# Patient Record
Sex: Female | Born: 1995 | Race: Asian | Hispanic: No | Smoking: Never smoker
Health system: Southern US, Community
[De-identification: ages and names within clinical notes are randomized; demographics above are authoritative.]

## PROBLEM LIST (undated history)

## (undated) ENCOUNTER — Inpatient Hospital Stay (HOSPITAL_COMMUNITY): Payer: Self-pay

## (undated) DIAGNOSIS — D649 Anemia, unspecified: Secondary | ICD-10-CM

## (undated) DIAGNOSIS — Z8619 Personal history of other infectious and parasitic diseases: Secondary | ICD-10-CM

## (undated) DIAGNOSIS — O2303 Infections of kidney in pregnancy, third trimester: Secondary | ICD-10-CM

## (undated) HISTORY — PX: WISDOM TOOTH EXTRACTION: SHX21

## (undated) HISTORY — DX: Personal history of other infectious and parasitic diseases: Z86.19

---
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
## 2014-07-18 LAB — OB RESULTS CONSOLE ANTIBODY SCREEN: ANTIBODY SCREEN: NEGATIVE

## 2014-07-18 LAB — OB RESULTS CONSOLE ABO/RH: RH Type: POSITIVE

## 2014-07-18 LAB — OB RESULTS CONSOLE HIV ANTIBODY (ROUTINE TESTING): HIV: NONREACTIVE

## 2014-07-18 LAB — OB RESULTS CONSOLE RPR: RPR: NONREACTIVE

## 2014-07-18 LAB — OB RESULTS CONSOLE GC/CHLAMYDIA
CHLAMYDIA, DNA PROBE: NEGATIVE
GC PROBE AMP, GENITAL: NEGATIVE

## 2014-07-18 LAB — OB RESULTS CONSOLE RUBELLA ANTIBODY, IGM: Rubella: IMMUNE

## 2014-07-18 LAB — OB RESULTS CONSOLE HEPATITIS B SURFACE ANTIGEN: HEP B S AG: NEGATIVE

## 2014-12-28 LAB — OB RESULTS CONSOLE GBS: GBS: NEGATIVE

## 2014-12-31 ENCOUNTER — Inpatient Hospital Stay (HOSPITAL_COMMUNITY)
Admission: AD | Admit: 2014-12-31 | Discharge: 2015-01-01 | Disposition: A | Discharge status: Home / Self Care | Payer: PRIVATE HEALTH INSURANCE | Source: Ambulatory Visit | Attending: Obstetrics and Gynecology | Admitting: Obstetrics and Gynecology

## 2014-12-31 ENCOUNTER — Encounter (HOSPITAL_COMMUNITY): Payer: Self-pay | Admitting: *Deleted

## 2014-12-31 DIAGNOSIS — Z3A37 37 weeks gestation of pregnancy: Secondary | ICD-10-CM | POA: Insufficient documentation

## 2014-12-31 DIAGNOSIS — O99613 Diseases of the digestive system complicating pregnancy, third trimester: Secondary | ICD-10-CM | POA: Insufficient documentation

## 2014-12-31 DIAGNOSIS — A084 Viral intestinal infection, unspecified: Secondary | ICD-10-CM | POA: Insufficient documentation

## 2014-12-31 DIAGNOSIS — O212 Late vomiting of pregnancy: Secondary | ICD-10-CM | POA: Diagnosis present

## 2014-12-31 LAB — URINE MICROSCOPIC-ADD ON: RBC / HPF: NONE SEEN RBC/hpf (ref 0–5)

## 2014-12-31 LAB — URINALYSIS, ROUTINE W REFLEX MICROSCOPIC
Bilirubin Urine: NEGATIVE
GLUCOSE, UA: NEGATIVE mg/dL
HGB URINE DIPSTICK: NEGATIVE
KETONES UR: NEGATIVE mg/dL
Nitrite: NEGATIVE
PROTEIN: NEGATIVE mg/dL
Specific Gravity, Urine: 1.015 (ref 1.005–1.030)
pH: 7 (ref 5.0–8.0)

## 2014-12-31 NOTE — MAU Provider Note (Signed)
  History     CSN: [id number]  Arrival date and time: 12/31/14 2308   First Provider Initiated Contact with Patient 12/31/14 2354      No chief complaint on file.  HPI Comments: Courtney Colon is a 19 y.o. G1P0 at 5862w5d who presents today with vomiting. She states that for about 2-3 days she has felt nauseous, and today she vomited once. She denies any abdominal pain, contractions or vaginal bleeding. She states that the fetus has been moving normally.   Emesis  This is a new problem. The current episode started today. The problem occurs less than 2 times per day. The problem has been resolved. The emesis has an appearance of stomach contents. There has been no fever. Pertinent negatives include no abdominal pain, chills, diarrhea or fever. Risk factors: No sick contacts, no suspect food intake.  She has tried nothing for the symptoms.     Past Medical History  Diagnosis Date  . Medical history non-contributory     Past Surgical History  Procedure Laterality Date  . Wisdom tooth extraction    . Mouth surgery  2011    No family history on file.  Social History  Substance Use Topics  . Smoking status: None  . Smokeless tobacco: None  . Alcohol Use: None    Allergies: No Known Allergies  No prescriptions prior to admission    Review of Systems  Constitutional: Negative for fever and chills.  Gastrointestinal: Positive for nausea and vomiting. Negative for abdominal pain, diarrhea and constipation.  Genitourinary: Negative for dysuria, urgency and frequency.   Physical Exam   Blood pressure 129/84, pulse 120, temperature 98.7 F (37.1 C), temperature source Oral, resp. rate 20, height 4\' 10"  (1.473 m), weight 62.313 kg (137 lb 6 oz).  Physical Exam  Nursing note and vitals reviewed. Constitutional: She is oriented to person, place, and time. She appears well-developed and well-nourished. No distress.  HENT:  Head: Normocephalic.  Cardiovascular: Normal rate.    Respiratory: Effort normal.  GI: Soft. There is no tenderness. There is no rebound.  Neurological: She is alert and oriented to person, place, and time.  Skin: Skin is warm and dry.  Psychiatric: She has a normal mood and affect.   FHT: 130, moderate with 15x15 accels, no decels Toco: no UCs Cervical examL 1/70/-3  MAU Course  Procedures  MDM 0113: D/W Dr. Tenny Crawoss. [facility] for [employer] [facility].   Assessment and Plan   1. Viral gastroenteritis    DC home Comfort measures reviewed  3rd Trimester precautions  labor precautions  Fetal kick counts RX: zofran #20 0RF  Return to MAU as needed FU with OB as planned  Follow-up Information    Follow up with Almon HerculesOSS,KENDRA H., MD.   Specialty:  Obstetrics and Gynecology   Why:  As scheduled   Contact information:   [address] [place] [id number] [phone]         Tawnya CrookHogan, Heather Donovan 12/31/2014, 11:56 PM

## 2014-12-31 NOTE — MAU Note (Signed)
PT SAYS SHE  HAD NAUSEA LAST WEEK  BUT  TODAY   STARTED  VOMITING.   NO UC.  VE IN OFFICE  - 1  CM .    DENIES HSV AND MRSA. GBS- UNSURE.

## 2015-01-01 DIAGNOSIS — A084 Viral intestinal infection, unspecified: Secondary | ICD-10-CM

## 2015-01-01 DIAGNOSIS — O99613 Diseases of the digestive system complicating pregnancy, third trimester: Secondary | ICD-10-CM | POA: Diagnosis not present

## 2015-01-01 MED ORDER — ONDANSETRON 8 MG PO TBDP: 8.0000 mg | ORAL_TABLET | Freq: Three times a day (TID) | ORAL | Status: DC | PRN

## 2015-01-01 MED ORDER — ACETAMINOPHEN 500 MG PO TABS
1000.0000 mg | ORAL_TABLET | Freq: Once | ORAL | Status: AC
  Administered 2015-01-01: 1000 mg via ORAL
  Filled 2015-01-01: qty 2

## 2015-01-01 MED ORDER — ONDANSETRON 8 MG PO TBDP
8.0000 mg | ORAL_TABLET | Freq: Once | ORAL | Status: AC
  Administered 2015-01-01: 8 mg via ORAL
  Filled 2015-01-01: qty 1

## 2015-01-01 NOTE — Discharge Instructions (Signed)

## 2015-01-02 ENCOUNTER — Inpatient Hospital Stay (HOSPITAL_COMMUNITY)
Admission: AD | Admit: 2015-01-02 | Discharge: 2015-01-02 | Disposition: A | Discharge status: Home / Self Care | Payer: PRIVATE HEALTH INSURANCE | Source: Ambulatory Visit | Attending: Obstetrics | Admitting: Obstetrics

## 2015-01-02 ENCOUNTER — Encounter (HOSPITAL_COMMUNITY): Payer: Self-pay

## 2015-01-02 DIAGNOSIS — Z3493 Encounter for supervision of normal pregnancy, unspecified, third trimester: Secondary | ICD-10-CM | POA: Insufficient documentation

## 2015-01-02 NOTE — Discharge Instructions (Signed)
Third Trimester of Pregnancy °The third trimester is from week 29 through week 42, months 7 through 9. The third trimester is a time when the fetus is growing rapidly. At the end of the ninth month, the fetus is about 20 inches in length and weighs 6-10 pounds.  °BODY CHANGES °Your body goes through many changes during pregnancy. The changes vary from woman to woman.  °· Your weight will continue to increase. You can expect to gain 25-35 pounds (11-16 kg) by the end of the pregnancy. °· You may begin to get stretch marks on your hips, abdomen, and breasts. °· You may urinate more often because the fetus is moving lower into your pelvis and pressing on your bladder. °· You may develop or continue to have heartburn as a result of your pregnancy. °· You may develop constipation because certain hormones are causing the muscles that push waste through your intestines to slow down. °· You may develop hemorrhoids or swollen, bulging veins (varicose veins). °· You may have pelvic pain because of the weight gain and pregnancy hormones relaxing your joints between the bones in your pelvis. Backaches may result from overexertion of the muscles supporting your posture. °· You may have changes in your hair. These can include thickening of your hair, rapid growth, and changes in texture. Some women also have hair loss during or after pregnancy, or hair that feels dry or thin. Your hair will most likely return to normal after your baby is born. °· Your breasts will continue to grow and be tender. A yellow discharge may leak from your breasts called colostrum. °· Your belly button may stick out. °· You may feel short of breath because of your expanding uterus. °· You may notice the fetus "dropping," or moving lower in your abdomen. °· You may have a bloody mucus discharge. This usually occurs a few days to a week before labor begins. °· Your cervix becomes thin and soft (effaced) near your due date. °WHAT TO EXPECT AT YOUR PRENATAL  EXAMS  °You will have prenatal exams every 2 weeks until week 36. Then, you will have weekly prenatal exams. During a routine prenatal visit: °· You will be weighed to make sure you and the fetus are growing normally. °· Your blood pressure is taken. °· Your abdomen will be measured to track your baby's growth. °· The fetal heartbeat will be listened to. °· Any test results from the previous visit will be discussed. °· You may have a cervical check near your due date to see if you have effaced. °At around 36 weeks, your caregiver will check your cervix. At the same time, your caregiver will also perform a test on the secretions of the vaginal tissue. This test is to determine if a type of bacteria, Group B streptococcus, is present. Your caregiver will explain this further. °Your caregiver may ask you: °· What your birth plan is. °· How you are feeling. °· If you are feeling the baby move. °· If you have had any abnormal symptoms, such as leaking fluid, bleeding, severe headaches, or abdominal cramping. °· If you are using any tobacco products, including cigarettes, chewing tobacco, and electronic cigarettes. °· If you have any questions. °Other tests or screenings that may be performed during your third trimester include: °· Blood tests that check for low iron levels (anemia). °· Fetal testing to check the health, activity level, and growth of the fetus. Testing is done if you have certain medical conditions or if   there are problems during the pregnancy. °· HIV (human immunodeficiency virus) testing. If you are at high risk, you may be screened for HIV during your third trimester of pregnancy. °FALSE LABOR °You may feel small, irregular contractions that eventually go away. These are called Braxton Hicks contractions, or false labor. Contractions may last for hours, days, or even weeks before true labor sets in. If contractions come at regular intervals, intensify, or become painful, it is best to be seen by your  caregiver.  °SIGNS OF LABOR  °· Menstrual-like cramps. °· Contractions that are 5 minutes apart or less. °· Contractions that start on the top of the uterus and spread down to the lower abdomen and back. °· A sense of increased pelvic pressure or back pain. °· A watery or bloody mucus discharge that comes from the vagina. °If you have any of these signs before the 37th week of pregnancy, call your caregiver right away. You need to go to the hospital to get checked immediately. °HOME CARE INSTRUCTIONS  °· Avoid all smoking, herbs, alcohol, and unprescribed drugs. These chemicals affect the formation and growth of the baby. °· Do not use any tobacco products, including cigarettes, chewing tobacco, and electronic cigarettes. If you need help quitting, ask your health care provider. You may receive counseling support and other resources to help you quit. °· Follow your caregiver's instructions regarding medicine use. There are medicines that are either safe or unsafe to take during pregnancy. °· Exercise only as directed by your caregiver. Experiencing uterine cramps is a good sign to stop exercising. °· Continue to eat regular, healthy meals. °· Wear a good support bra for breast tenderness. °· Do not use hot tubs, steam rooms, or saunas. °· Wear your seat belt at all times when driving. °· Avoid raw meat, uncooked cheese, cat litter boxes, and soil used by cats. These carry germs that can cause birth defects in the baby. °· Take your prenatal vitamins. °· Take 1500-2000 mg of calcium daily starting at the 20th week of pregnancy until you deliver your baby. °· Try taking a stool softener (if your caregiver approves) if you develop constipation. Eat more high-fiber foods, such as fresh vegetables or fruit and whole grains. Drink plenty of fluids to keep your urine clear or pale yellow. °· Take warm sitz baths to soothe any pain or discomfort caused by hemorrhoids. Use hemorrhoid cream if your caregiver approves. °· If  you develop varicose veins, wear support hose. Elevate your feet for 15 minutes, 3-4 times a day. Limit salt in your diet. °· Avoid heavy lifting, wear low heal shoes, and practice good posture. °· Rest a lot with your legs elevated if you have leg cramps or low back pain. °· Visit your dentist if you have not gone during your pregnancy. Use a soft toothbrush to brush your teeth and be gentle when you floss. °· A sexual relationship may be continued unless your caregiver directs you otherwise. °· Do not travel far distances unless it is absolutely necessary and only with the approval of your caregiver. °· Take prenatal classes to understand, practice, and ask questions about the labor and delivery. °· Make a trial run to the hospital. °· Pack your hospital bag. °· Prepare the baby's nursery. °· Continue to go to all your prenatal visits as directed by your caregiver. °SEEK MEDICAL CARE IF: °· You are unsure if you are in labor or if your water has broken. °· You have dizziness. °· You have   mild pelvic cramps, pelvic pressure, or nagging pain in your abdominal area. °· You have persistent nausea, vomiting, or diarrhea. °· You have a bad smelling vaginal discharge. °· You have pain with urination. °SEEK IMMEDIATE MEDICAL CARE IF:  °· You have a fever. °· You are leaking fluid from your vagina. °· You have spotting or bleeding from your vagina. °· You have severe abdominal cramping or pain. °· You have rapid weight loss or gain. °· You have shortness of breath with chest pain. °· You notice sudden or extreme swelling of your face, hands, ankles, feet, or legs. °· You have not felt your baby move in over an hour. °· You have severe headaches that do not go away with medicine. °· You have vision changes. °  °This information is not intended to replace advice given to you by your health care provider. Make sure you discuss any questions you have with your health care provider. °  °Document Released: 01/06/2001 Document  Revised: 02/02/2014 Document Reviewed: 03/15/2012 °Elsevier Interactive Patient Education ©2016 [employer] °Fetal Movement Counts °Patient Name: __________________________________________________ Patient Due Date: ____________________ °Performing a fetal movement count is highly recommended in high-risk pregnancies, but it is good for every pregnant woman to do. Your health care provider may ask you to start counting fetal movements at 28 weeks of the pregnancy. Fetal movements often increase: °· After eating a full meal. °· After physical activity. °· After eating or drinking something sweet or cold. °· At rest. °Pay attention to when you feel the baby is most active. This will help you notice a pattern of your baby's sleep and wake cycles and what factors contribute to an increase in fetal movement. It is important to perform a fetal movement count at the same time each day when your baby is normally most active.  °HOW TO COUNT FETAL MOVEMENTS °· Find a quiet and comfortable area to sit or lie down on your left side. Lying on your left side provides the best blood and oxygen circulation to your baby. °· Write down the day and time on a sheet of paper or in a journal. °· Start counting kicks, flutters, swishes, rolls, or jabs in a 2-hour period. You should feel at least 10 movements within 2 hours. °· If you do not feel 10 movements in 2 hours, wait 2-3 hours and count again. Look for a change in the pattern or not enough counts in 2 hours. °SEEK MEDICAL CARE IF: °· You feel less than 10 counts in 2 hours, tried twice. °· There is no movement in over an hour. °· The pattern is changing or taking longer each day to reach 10 counts in 2 hours. °· You feel the baby is not moving as he or she usually does. °Date: ____________ Movements: ____________ Start time: ____________ Finish time: ____________  °Date: ____________ Movements: ____________ Start time: ____________ Finish time: ____________ °Date: ____________  Movements: ____________ Start time: ____________ Finish time: ____________ °Date: ____________ Movements: ____________ Start time: ____________ Finish time: ____________ °Date: ____________ Movements: ____________ Start time: ____________ Finish time: ____________ °Date: ____________ Movements: ____________ Start time: ____________ Finish time: ____________ °Date: ____________ Movements: ____________ Start time: ____________ Finish time: ____________ °Date: ____________ Movements: ____________ Start time: ____________ Finish time: ____________  °Date: ____________ Movements: ____________ Start time: ____________ Finish time: ____________ °Date: ____________ Movements: ____________ Start time: ____________ Finish time: ____________ °Date: ____________ Movements: ____________ Start time: ____________ Finish time: ____________ °Date: ____________ Movements: ____________ Start time: ____________ Finish time: ____________ °Date:   ____________ Movements: ____________ Start time: ____________ Finish time: ____________ °Date: ____________ Movements: ____________ Start time: ____________ Finish time: ____________ °Date: ____________ Movements: ____________ Start time: ____________ Finish time: ____________  °Date: ____________ Movements: ____________ Start time: ____________ Finish time: ____________ °Date: ____________ Movements: ____________ Start time: ____________ Finish time: ____________ °Date: ____________ Movements: ____________ Start time: ____________ Finish time: ____________ °Date: ____________ Movements: ____________ Start time: ____________ Finish time: ____________ °Date: ____________ Movements: ____________ Start time: ____________ Finish time: ____________ °Date: ____________ Movements: ____________ Start time: ____________ Finish time: ____________ °Date: ____________ Movements: ____________ Start time: ____________ Finish time: ____________  °Date: ____________ Movements: ____________ Start time:  ____________ Finish time: ____________ °Date: ____________ Movements: ____________ Start time: ____________ Finish time: ____________ °Date: ____________ Movements: ____________ Start time: ____________ Finish time: ____________ °Date: ____________ Movements: ____________ Start time: ____________ Finish time: ____________ °Date: ____________ Movements: ____________ Start time: ____________ Finish time: ____________ °Date: ____________ Movements: ____________ Start time: ____________ Finish time: ____________ °Date: ____________ Movements: ____________ Start time: ____________ Finish time: ____________  °Date: ____________ Movements: ____________ Start time: ____________ Finish time: ____________ °Date: ____________ Movements: ____________ Start time: ____________ Finish time: ____________ °Date: ____________ Movements: ____________ Start time: ____________ Finish time: ____________ °Date: ____________ Movements: ____________ Start time: ____________ Finish time: ____________ °Date: ____________ Movements: ____________ Start time: ____________ Finish time: ____________ °Date: ____________ Movements: ____________ Start time: ____________ Finish time: ____________ °Date: ____________ Movements: ____________ Start time: ____________ Finish time: ____________  °Date: ____________ Movements: ____________ Start time: ____________ Finish time: ____________ °Date: ____________ Movements: ____________ Start time: ____________ Finish time: ____________ °Date: ____________ Movements: ____________ Start time: ____________ Finish time: ____________ °Date: ____________ Movements: ____________ Start time: ____________ Finish time: ____________ °Date: ____________ Movements: ____________ Start time: ____________ Finish time: ____________ °Date: ____________ Movements: ____________ Start time: ____________ Finish time: ____________ °Date: ____________ Movements: ____________ Start time: ____________ Finish time: ____________  °Date:  ____________ Movements: ____________ Start time: ____________ Finish time: ____________ °Date: ____________ Movements: ____________ Start time: ____________ Finish time: ____________ °Date: ____________ Movements: ____________ Start time: ____________ Finish time: ____________ °Date: ____________ Movements: ____________ Start time: ____________ Finish time: ____________ °Date: ____________ Movements: ____________ Start time: ____________ Finish time: ____________ °Date: ____________ Movements: ____________ Start time: ____________ Finish time: ____________ °Date: ____________ Movements: ____________ Start time: ____________ Finish time: ____________  °Date: ____________ Movements: ____________ Start time: ____________ Finish time: ____________ °Date: ____________ Movements: ____________ Start time: ____________ Finish time: ____________ °Date: ____________ Movements: ____________ Start time: ____________ Finish time: ____________ °Date: ____________ Movements: ____________ Start time: ____________ Finish time: ____________ °Date: ____________ Movements: ____________ Start time: ____________ Finish time: ____________ °Date: ____________ Movements: ____________ Start time: ____________ Finish time: ____________ °  °This information is not intended to replace advice given to you by your health care provider. Make sure you discuss any questions you have with your health care provider. °  °Document Released: 02/11/2006 Document Revised: 02/02/2014 Document Reviewed: 11/09/2011 °[employer]vier Interactive Patient Education ©2016 [employer] °Braxton Hicks Contractions °Contractions of the uterus can occur throughout pregnancy. Contractions are not always a sign that you are in labor.  °WHAT ARE BRAXTON HICKS CONTRACTIONS?  °Contractions that occur before labor are called Braxton Hicks contractions, or false labor. Toward the end of pregnancy (32-34 weeks), these contractions can develop more often and may become more  forceful. This is not true labor because these contractions do not result in opening (dilatation) and thinning of the cervix. They are sometimes difficult to tell apart from true labor because these contractions can be forceful and people have different pain tolerances. You should   not feel embarrassed if you go to the hospital with false labor. Sometimes, the only way to tell if you are in true labor is for your health care provider to look for changes in the cervix. °If there are no prenatal problems or other health problems associated with the pregnancy, it is completely safe to be sent home with false labor and await the onset of true labor. °HOW CAN YOU TELL THE DIFFERENCE BETWEEN TRUE AND FALSE LABOR? °False Labor °· The contractions of false labor are usually shorter and not as hard as those of true labor.   °· The contractions are usually irregular.   °· The contractions are often felt in the front of the lower abdomen and in the groin.   °· The contractions may go away when you walk around or change positions while lying down.   °· The contractions get weaker and are shorter lasting as time goes on.   °· The contractions do not usually become progressively stronger, regular, and closer together as with true labor.   °True Labor °· Contractions in true labor last 30-70 seconds, become very regular, usually become more intense, and increase in frequency.   °· The contractions do not go away with walking.   °· The discomfort is usually felt in the top of the uterus and spreads to the lower abdomen and low back.   °· True labor can be determined by your health care provider with an exam. This will show that the cervix is dilating and getting thinner.   °WHAT TO REMEMBER °· Keep up with your usual exercises and follow other instructions given by your health care provider.   °· Take medicines as directed by your health care provider.   °· Keep your regular prenatal appointments.   °· Eat and drink lightly if you  think you are going into labor.   °· If Braxton Hicks contractions are making you uncomfortable:   °· Change your position from lying down or resting to walking, or from walking to resting.   °· Sit and rest in a tub of warm water.   °· Drink 2-3 glasses of water. Dehydration may cause these contractions.   °· Do slow and deep breathing several times an hour.   °WHEN SHOULD I SEEK IMMEDIATE MEDICAL CARE? °Seek immediate medical care if: °· Your contractions become stronger, more regular, and closer together.   °· You have fluid leaking or gushing from your vagina.   °· You have a fever.   °· You pass blood-tinged mucus.   °· You have vaginal bleeding.   °· You have continuous abdominal pain.   °· You have low back pain that you never had before.   °· You feel your baby's head pushing down and causing pelvic pressure.   °· Your baby is not moving as much as it used to.   °  °This information is not intended to replace advice given to you by your health care provider. Make sure you discuss any questions you have with your health care provider. °  °Document Released: 01/12/2005 Document Revised: 01/17/2013 Document Reviewed: 10/24/2012 °Elsevier Interactive Patient Education ©2016 [employer] ° °

## 2015-01-02 NOTE — Progress Notes (Signed)
Notified of pt arrival in MAU and complaint. Will discharge home with reassurance and labor precautions

## 2015-01-02 NOTE — MAU Note (Signed)
Pain in upper abd, first time was at 3, went away completely, now has come back.  Has been vomiting. Denies HA, visual changes.

## 2015-01-07 ENCOUNTER — Ambulatory Visit (INDEPENDENT_AMBULATORY_CARE_PROVIDER_SITE_OTHER): Payer: Self-pay | Admitting: Pediatrics

## 2015-01-07 DIAGNOSIS — Z7681 Expectant parent(s) prebirth pediatrician visit: Secondary | ICD-10-CM

## 2015-01-07 DIAGNOSIS — Z3490 Encounter for supervision of normal pregnancy, unspecified, unspecified trimester: Secondary | ICD-10-CM

## 2015-01-07 NOTE — Progress Notes (Signed)
Prenatal counseling for impending newborn done  

## 2015-01-10 ENCOUNTER — Inpatient Hospital Stay (HOSPITAL_COMMUNITY)
Admission: AD | Admit: 2015-01-10 | Discharge: 2015-01-13 | DRG: 766 | Disposition: A | Discharge status: Home / Self Care | Payer: No Typology Code available for payment source | Source: Ambulatory Visit | Attending: Obstetrics and Gynecology | Admitting: Obstetrics and Gynecology

## 2015-01-10 ENCOUNTER — Inpatient Hospital Stay (HOSPITAL_COMMUNITY): Payer: No Typology Code available for payment source | Admitting: Anesthesiology

## 2015-01-10 ENCOUNTER — Encounter (HOSPITAL_COMMUNITY): Payer: Self-pay

## 2015-01-10 ENCOUNTER — Inpatient Hospital Stay (HOSPITAL_COMMUNITY)
Admission: AD | Admit: 2015-01-10 | Discharge: 2015-01-10 | Disposition: A | Discharge status: Home / Self Care | Payer: No Typology Code available for payment source | Source: Ambulatory Visit | Attending: Obstetrics and Gynecology | Admitting: Obstetrics and Gynecology

## 2015-01-10 DIAGNOSIS — Z3A39 39 weeks gestation of pregnancy: Secondary | ICD-10-CM

## 2015-01-10 DIAGNOSIS — IMO0001 Reserved for inherently not codable concepts without codable children: Secondary | ICD-10-CM

## 2015-01-10 LAB — CBC
HCT: 36.2 % (ref 36.0–46.0)
HEMOGLOBIN: 12.1 g/dL (ref 12.0–15.0)
MCH: 28.7 pg (ref 26.0–34.0)
MCHC: 33.4 g/dL (ref 30.0–36.0)
MCV: 85.8 fL (ref 78.0–100.0)
Platelets: 205 10*3/uL (ref 150–400)
RBC: 4.22 MIL/uL (ref 3.87–5.11)
RDW: 14.1 % (ref 11.5–15.5)
WBC: 18.3 10*3/uL — AB (ref 4.0–10.5)

## 2015-01-10 LAB — TYPE AND SCREEN
ABO/RH(D): O POS
ANTIBODY SCREEN: NEGATIVE

## 2015-01-10 LAB — ABO/RH: ABO/RH(D): O POS

## 2015-01-10 MED ORDER — LIDOCAINE HCL (PF) 1 % IJ SOLN
INTRAMUSCULAR | Status: DC | PRN
  Administered 2015-01-10: 3 mL via EPIDURAL
  Administered 2015-01-10: 4 mL via EPIDURAL

## 2015-01-10 MED ORDER — FLEET ENEMA 7-19 GM/118ML RE ENEM: 1.0000 | ENEMA | RECTAL | Status: DC | PRN

## 2015-01-10 MED ORDER — ACETAMINOPHEN 325 MG PO TABS
650.0000 mg | ORAL_TABLET | ORAL | Status: DC | PRN
  Administered 2015-01-11: 650 mg via ORAL
  Filled 2015-01-10: qty 2

## 2015-01-10 MED ORDER — PHENYLEPHRINE 40 MCG/ML (10ML) SYRINGE FOR IV PUSH (FOR BLOOD PRESSURE SUPPORT)
80.0000 ug | PREFILLED_SYRINGE | INTRAVENOUS | Status: DC | PRN
  Filled 2015-01-10: qty 20

## 2015-01-10 MED ORDER — LIDOCAINE HCL (PF) 1 % IJ SOLN
30.0000 mL | INTRAMUSCULAR | Status: DC | PRN
  Filled 2015-01-10: qty 30

## 2015-01-10 MED ORDER — EPHEDRINE 5 MG/ML INJ: 10.0000 mg | INTRAVENOUS | Status: DC | PRN

## 2015-01-10 MED ORDER — CITRIC ACID-SODIUM CITRATE 334-500 MG/5ML PO SOLN
30.0000 mL | ORAL | Status: DC | PRN
  Administered 2015-01-11: 30 mL via ORAL
  Filled 2015-01-10: qty 15

## 2015-01-10 MED ORDER — OXYTOCIN 40 UNITS IN LACTATED RINGERS INFUSION - SIMPLE MED
1.0000 m[IU]/min | INTRAVENOUS | Status: DC
  Administered 2015-01-11: 2 m[IU]/min via INTRAVENOUS

## 2015-01-10 MED ORDER — DIPHENHYDRAMINE HCL 50 MG/ML IJ SOLN: 12.5000 mg | INTRAMUSCULAR | Status: DC | PRN

## 2015-01-10 MED ORDER — TERBUTALINE SULFATE 1 MG/ML IJ SOLN: 0.2500 mg | Freq: Once | INTRAMUSCULAR | Status: DC | PRN

## 2015-01-10 MED ORDER — OXYTOCIN 40 UNITS IN LACTATED RINGERS INFUSION - SIMPLE MED
62.5000 mL/h | INTRAVENOUS | Status: DC
  Filled 2015-01-10: qty 1000

## 2015-01-10 MED ORDER — PHENYLEPHRINE 40 MCG/ML (10ML) SYRINGE FOR IV PUSH (FOR BLOOD PRESSURE SUPPORT): 80.0000 ug | PREFILLED_SYRINGE | INTRAVENOUS | Status: DC | PRN

## 2015-01-10 MED ORDER — ONDANSETRON HCL 4 MG/2ML IJ SOLN: 4.0000 mg | Freq: Four times a day (QID) | INTRAMUSCULAR | Status: DC | PRN

## 2015-01-10 MED ORDER — FENTANYL 2.5 MCG/ML BUPIVACAINE 1/10 % EPIDURAL INFUSION (WH - ANES): 14.0000 mL/h | INTRAMUSCULAR | Status: DC | PRN

## 2015-01-10 MED ORDER — LACTATED RINGERS IV SOLN
INTRAVENOUS | Status: DC
  Administered 2015-01-10 – 2015-01-11 (×3): via INTRAVENOUS

## 2015-01-10 MED ORDER — OXYTOCIN BOLUS FROM INFUSION: 500.0000 mL | INTRAVENOUS | Status: DC

## 2015-01-10 MED ORDER — LACTATED RINGERS IV SOLN
500.0000 mL | INTRAVENOUS | Status: DC | PRN
  Administered 2015-01-11: 300 mL via INTRAVENOUS

## 2015-01-10 MED ORDER — OXYCODONE-ACETAMINOPHEN 5-325 MG PO TABS
2.0000 | ORAL_TABLET | ORAL | Status: DC | PRN
Start: 2015-01-10 — End: 2015-01-11

## 2015-01-10 MED ORDER — FENTANYL 2.5 MCG/ML BUPIVACAINE 1/10 % EPIDURAL INFUSION (WH - ANES)
14.0000 mL/h | INTRAMUSCULAR | Status: DC | PRN
  Administered 2015-01-10: 11 mL/h via EPIDURAL
  Filled 2015-01-10: qty 125

## 2015-01-10 MED ORDER — OXYCODONE-ACETAMINOPHEN 5-325 MG PO TABS
1.0000 | ORAL_TABLET | ORAL | Status: DC | PRN
Start: 2015-01-10 — End: 2015-01-11

## 2015-01-10 NOTE — Discharge Instructions (Signed)

## 2015-01-10 NOTE — MAU Note (Signed)
Pt presents complaining of contractions every 10 minutes for 1 hour. Denies bleeding or leaking. Reports good fetal movement.

## 2015-01-10 NOTE — H&P (Signed)
19 y.o. 3145w1d  G1P0 comes in c/o labor.  Otherwise has good fetal movement and no bleeding.  Past Medical History  Diagnosis Date  . Medical history non-contributory     Past Surgical History  Procedure Laterality Date  . Wisdom tooth extraction    . Mouth surgery  2011    OB History  Gravida Para Term Preterm AB SAB TAB Ectopic Multiple Living  1             # Outcome Date GA Lbr Len/2nd Weight Sex Delivery Anes PTL Lv  1 Current               Social History   Social History  . Marital Status: Unknown    Spouse Name: N/A  . Number of Children: N/A  . Years of Education: N/A   Occupational History  . Not on file.   Social History Main Topics  . Smoking status: Never Smoker   . Smokeless tobacco: Not on file  . Alcohol Use: No  . Drug Use: No  . Sexual Activity: Not Currently   Other Topics Concern  . Not on file   Social History Narrative   Review of patient's allergies indicates no known allergies.    Prenatal Transfer Tool  Maternal Diabetes: No Genetic Screening: Declined Maternal Ultrasounds/Referrals: Normal Fetal Ultrasounds or other Referrals:  None Maternal Substance Abuse:  No Significant Maternal Medications:  None Significant Maternal Lab Results: Lab values include: Group B Strep negative  Other PNC: uncomplicated.    Filed Vitals:   01/10/15 1710  BP: 130/81  Pulse: 115  Temp: 98.6 F (37 C)  Resp: 20     Lungs/Cor:  NAD Abdomen:  soft, gravid Ex:  no cords, erythema SVE:  4/90/-2 FHTs:  145, good STV, NST R Toco:  q5   A/P   Admit to L&D in labor  GBS Neg  Epidural to be placed  Other routine care  [place]AH[place], [facility]NEY

## 2015-01-10 NOTE — Anesthesia Preprocedure Evaluation (Addendum)
Anesthesia Evaluation  Patient identified by MRN, date of birth, ID band Patient awake    Reviewed: Allergy & Precautions, NPO status , Patient's Chart, lab work & pertinent test results  Airway Mallampati: II  TM Distance: >3 FB Neck ROM: Full    Dental no notable dental hx. (+) Teeth Intact   Pulmonary neg pulmonary ROS,    Pulmonary exam normal breath sounds clear to auscultation       Cardiovascular negative cardio ROS Normal cardiovascular exam Rhythm:Regular Rate:Normal     Neuro/Psych negative neurological ROS  negative psych ROS   GI/Hepatic negative GI ROS, Neg liver ROS,   Endo/Other  negative endocrine ROS  Renal/GU negative Renal ROS  negative genitourinary   Musculoskeletal negative musculoskeletal ROS (+)   Abdominal   Peds  Hematology negative hematology ROS (+)   Anesthesia Other Findings   Reproductive/Obstetrics (+) Pregnancy                             Anesthesia Physical Anesthesia Plan  ASA: II and emergent  Anesthesia Plan: Epidural   Post-op Pain Management:    Induction:   Airway Management Planned: Natural Airway  Additional Equipment:   Intra-op Plan:   Post-operative Plan:   Informed Consent: I have reviewed the patients History and Physical, chart, labs and discussed the procedure including the risks, benefits and alternatives for the proposed anesthesia with the patient or authorized representative who has indicated his/her understanding and acceptance.     Plan Discussed with: Anesthesiologist, CRNA and Surgeon  Anesthesia Plan Comments: (Patient for urgent C/Section for fetal tachycardia, suspected chorioamnionitis, arrest of descent. Will use epidural for C/Section. )       Anesthesia Quick Evaluation

## 2015-01-10 NOTE — Anesthesia Procedure Notes (Signed)
Epidural Patient location during procedure: OB Start time: 01/10/2015 7:55 PM  Staffing Anesthesiologist: Mal AmabileFOSTER, Jourdan Durbin Performed by: anesthesiologist   Preanesthetic Checklist Completed: patient identified, site marked, surgical consent, pre-op evaluation, timeout performed, IV checked, risks and benefits discussed and monitors and equipment checked  Epidural Patient position: sitting Prep: site prepped and draped and DuraPrep Patient monitoring: continuous pulse ox and blood pressure Approach: midline Location: L3-L4 Injection technique: LOR air  Needle:  Needle type: Tuohy  Needle gauge: 17 G Needle length: 9 cm and 9 Needle insertion depth: 4 cm Catheter type: closed end flexible Catheter size: 19 Gauge Catheter at skin depth: 9 cm Test dose: negative and Other  Assessment Events: blood not aspirated, injection not painful, no injection resistance, negative IV test and no paresthesia  Additional Notes Patient identified. Risks and benefits discussed including failed block, incomplete  Pain control, post dural puncture headache, nerve damage, paralysis, blood pressure Changes, nausea, vomiting, reactions to medications-both toxic and allergic and post Partum back pain. All questions were answered. Patient expressed understanding and wished to proceed. Sterile technique was used throughout procedure. Epidural site was Dressed with sterile barrier dressing. No paresthesias, signs of intravascular injection Or signs of intrathecal spread were encountered.  Patient was more comfortable after the epidural was dosed. Please see RN's note for documentation of vital signs and FHR which are stable.

## 2015-01-10 NOTE — MAU Note (Signed)
Been having contractions, they are pretty close together and they are really painful

## 2015-01-11 ENCOUNTER — Encounter (HOSPITAL_COMMUNITY)
Admission: AD | Disposition: A | Discharge status: Home / Self Care | Payer: Self-pay | Source: Ambulatory Visit | Attending: Obstetrics and Gynecology

## 2015-01-11 ENCOUNTER — Encounter (HOSPITAL_COMMUNITY): Payer: Self-pay | Admitting: Anesthesiology

## 2015-01-11 DIAGNOSIS — Z3A39 39 weeks gestation of pregnancy: Secondary | ICD-10-CM

## 2015-01-11 LAB — RPR: RPR Ser Ql: NONREACTIVE

## 2015-01-11 SURGERY — Surgical Case
Anesthesia: Epidural

## 2015-01-11 MED ORDER — MEPERIDINE HCL 25 MG/ML IJ SOLN
6.2500 mg | INTRAMUSCULAR | Status: DC | PRN
Start: 2015-01-11 — End: 2015-01-11

## 2015-01-11 MED ORDER — CLINDAMYCIN PHOSPHATE 900 MG/50ML IV SOLN: 900.0000 mg | Freq: Once | INTRAVENOUS | Status: DC

## 2015-01-11 MED ORDER — ONDANSETRON HCL 4 MG/2ML IJ SOLN
INTRAMUSCULAR | Status: AC
  Filled 2015-01-11: qty 2

## 2015-01-11 MED ORDER — NALBUPHINE HCL 10 MG/ML IJ SOLN: 5.0000 mg | Freq: Once | INTRAMUSCULAR | Status: DC | PRN

## 2015-01-11 MED ORDER — SODIUM BICARBONATE 8.4 % IV SOLN
INTRAVENOUS | Status: AC
  Filled 2015-01-11: qty 50

## 2015-01-11 MED ORDER — KETOROLAC TROMETHAMINE 30 MG/ML IJ SOLN
INTRAMUSCULAR | Status: AC
  Filled 2015-01-11: qty 1

## 2015-01-11 MED ORDER — DIBUCAINE 1 % RE OINT: 1.0000 "application " | TOPICAL_OINTMENT | RECTAL | Status: DC | PRN

## 2015-01-11 MED ORDER — LANOLIN HYDROUS EX OINT: 1.0000 "application " | TOPICAL_OINTMENT | CUTANEOUS | Status: DC | PRN

## 2015-01-11 MED ORDER — WITCH HAZEL-GLYCERIN EX PADS: 1.0000 "application " | MEDICATED_PAD | CUTANEOUS | Status: DC | PRN

## 2015-01-11 MED ORDER — NALOXONE HCL 0.4 MG/ML IJ SOLN: 0.4000 mg | INTRAMUSCULAR | Status: DC | PRN

## 2015-01-11 MED ORDER — MENTHOL 3 MG MT LOZG: 1.0000 | LOZENGE | OROMUCOSAL | Status: DC | PRN

## 2015-01-11 MED ORDER — DIPHENHYDRAMINE HCL 50 MG/ML IJ SOLN: 12.5000 mg | INTRAMUSCULAR | Status: DC | PRN

## 2015-01-11 MED ORDER — ACETAMINOPHEN 325 MG PO TABS: 650.0000 mg | ORAL_TABLET | ORAL | Status: DC | PRN

## 2015-01-11 MED ORDER — LACTATED RINGERS IV SOLN
INTRAVENOUS | Status: DC
  Administered 2015-01-11: 10:00:00 via INTRAVENOUS

## 2015-01-11 MED ORDER — MORPHINE SULFATE (PF) 0.5 MG/ML IJ SOLN
INTRAMUSCULAR | Status: DC | PRN
  Administered 2015-01-11: 4 mg via EPIDURAL
  Administered 2015-01-11: 1 mg via INTRAVENOUS

## 2015-01-11 MED ORDER — NALOXONE HCL 2 MG/2ML IJ SOSY
1.0000 ug/kg/h | PREFILLED_SYRINGE | INTRAVENOUS | Status: DC | PRN
  Filled 2015-01-11: qty 2

## 2015-01-11 MED ORDER — TETANUS-DIPHTH-ACELL PERTUSSIS 5-2.5-18.5 LF-MCG/0.5 IM SUSP: 0.5000 mL | Freq: Once | INTRAMUSCULAR | Status: DC

## 2015-01-11 MED ORDER — GENTAMICIN SULFATE 40 MG/ML IJ SOLN
Freq: Three times a day (TID) | INTRAMUSCULAR | Status: DC
  Administered 2015-01-11: 05:00:00 via INTRAVENOUS
  Administered 2015-01-11: 109 mL via INTRAVENOUS
  Filled 2015-01-11 (×2): qty 3

## 2015-01-11 MED ORDER — KETOROLAC TROMETHAMINE 30 MG/ML IJ SOLN
30.0000 mg | Freq: Four times a day (QID) | INTRAMUSCULAR | Status: AC | PRN
  Administered 2015-01-11: 30 mg via INTRAMUSCULAR

## 2015-01-11 MED ORDER — MORPHINE SULFATE (PF) 0.5 MG/ML IJ SOLN
INTRAMUSCULAR | Status: AC
  Filled 2015-01-11: qty 10

## 2015-01-11 MED ORDER — SIMETHICONE 80 MG PO CHEW
80.0000 mg | CHEWABLE_TABLET | ORAL | Status: DC | PRN
Start: 2015-01-11 — End: 2015-01-13

## 2015-01-11 MED ORDER — NALBUPHINE HCL 10 MG/ML IJ SOLN: 5.0000 mg | INTRAMUSCULAR | Status: DC | PRN

## 2015-01-11 MED ORDER — SCOPOLAMINE 1 MG/3DAYS TD PT72
MEDICATED_PATCH | TRANSDERMAL | Status: DC | PRN
  Administered 2015-01-11: 1 via TRANSDERMAL

## 2015-01-11 MED ORDER — SODIUM CHLORIDE 0.9 % IV SOLN
8.0000 mg | Freq: Three times a day (TID) | INTRAVENOUS | Status: DC | PRN
  Administered 2015-01-11: 8 mg via INTRAVENOUS
  Filled 2015-01-11 (×2): qty 4

## 2015-01-11 MED ORDER — LACTATED RINGERS IV SOLN
INTRAVENOUS | Status: DC | PRN
  Administered 2015-01-11 (×3): via INTRAVENOUS

## 2015-01-11 MED ORDER — GENTAMICIN SULFATE 40 MG/ML IJ SOLN
Freq: Once | INTRAVENOUS | Status: AC
  Administered 2015-01-11: 13:00:00 via INTRAVENOUS
  Filled 2015-01-11 (×2): qty 3

## 2015-01-11 MED ORDER — KETOROLAC TROMETHAMINE 30 MG/ML IJ SOLN: 30.0000 mg | Freq: Four times a day (QID) | INTRAMUSCULAR | Status: AC | PRN

## 2015-01-11 MED ORDER — SIMETHICONE 80 MG PO CHEW
80.0000 mg | CHEWABLE_TABLET | Freq: Three times a day (TID) | ORAL | Status: DC
  Administered 2015-01-11 – 2015-01-13 (×7): 80 mg via ORAL
  Filled 2015-01-11 (×7): qty 1

## 2015-01-11 MED ORDER — SODIUM CHLORIDE 0.9 % IV SOLN
2.0000 g | Freq: Four times a day (QID) | INTRAVENOUS | Status: DC
  Administered 2015-01-11 (×2): 2 g via INTRAVENOUS
  Filled 2015-01-11 (×3): qty 2000

## 2015-01-11 MED ORDER — ONDANSETRON HCL 4 MG/2ML IJ SOLN
INTRAMUSCULAR | Status: DC | PRN
  Administered 2015-01-11: 4 mg via INTRAVENOUS

## 2015-01-11 MED ORDER — SODIUM CHLORIDE 0.9 % IJ SOLN: 3.0000 mL | INTRAMUSCULAR | Status: DC | PRN

## 2015-01-11 MED ORDER — FENTANYL CITRATE (PF) 100 MCG/2ML IJ SOLN: 25.0000 ug | INTRAMUSCULAR | Status: DC | PRN

## 2015-01-11 MED ORDER — DIPHENHYDRAMINE HCL 25 MG PO CAPS: 25.0000 mg | ORAL_CAPSULE | ORAL | Status: DC | PRN

## 2015-01-11 MED ORDER — CLINDAMYCIN PHOSPHATE 900 MG/50ML IV SOLN: 900.0000 mg | Freq: Three times a day (TID) | INTRAVENOUS | Status: DC

## 2015-01-11 MED ORDER — LIDOCAINE-EPINEPHRINE (PF) 2 %-1:200000 IJ SOLN
INTRAMUSCULAR | Status: AC
  Filled 2015-01-11: qty 20

## 2015-01-11 MED ORDER — SCOPOLAMINE 1 MG/3DAYS TD PT72: 1.0000 | MEDICATED_PATCH | Freq: Once | TRANSDERMAL | Status: DC

## 2015-01-11 MED ORDER — SODIUM BICARBONATE 8.4 % IV SOLN
INTRAVENOUS | Status: DC | PRN
  Administered 2015-01-11: 5 mL via EPIDURAL
  Administered 2015-01-11: 3 mL via EPIDURAL
  Administered 2015-01-11: 5 mL via EPIDURAL
  Administered 2015-01-11: 4 mL via EPIDURAL

## 2015-01-11 MED ORDER — FAMOTIDINE IN NACL 20-0.9 MG/50ML-% IV SOLN
20.0000 mg | Freq: Two times a day (BID) | INTRAVENOUS | Status: DC
  Administered 2015-01-11 – 2015-01-12 (×3): 20 mg via INTRAVENOUS
  Filled 2015-01-11 (×5): qty 50

## 2015-01-11 MED ORDER — GENTAMICIN SULFATE 40 MG/ML IJ SOLN: 120.0000 mg | Freq: Once | INTRAVENOUS | Status: DC

## 2015-01-11 MED ORDER — ONDANSETRON HCL 4 MG/2ML IJ SOLN: 4.0000 mg | Freq: Three times a day (TID) | INTRAMUSCULAR | Status: DC | PRN

## 2015-01-11 MED ORDER — OXYTOCIN 10 UNIT/ML IJ SOLN
40.0000 [IU] | INTRAVENOUS | Status: DC | PRN
  Administered 2015-01-11: 40 [IU] via INTRAVENOUS

## 2015-01-11 MED ORDER — ZOLPIDEM TARTRATE 5 MG PO TABS: 5.0000 mg | ORAL_TABLET | Freq: Every evening | ORAL | Status: DC | PRN

## 2015-01-11 MED ORDER — OXYCODONE-ACETAMINOPHEN 5-325 MG PO TABS: 2.0000 | ORAL_TABLET | ORAL | Status: DC | PRN

## 2015-01-11 MED ORDER — SCOPOLAMINE 1 MG/3DAYS TD PT72
MEDICATED_PATCH | TRANSDERMAL | Status: AC
  Filled 2015-01-11: qty 1

## 2015-01-11 MED ORDER — SODIUM CHLORIDE 0.9 % IV SOLN
2.0000 g | Freq: Four times a day (QID) | INTRAVENOUS | Status: AC
  Administered 2015-01-11: 2 g via INTRAVENOUS
  Filled 2015-01-11: qty 2000

## 2015-01-11 MED ORDER — SIMETHICONE 80 MG PO CHEW
80.0000 mg | CHEWABLE_TABLET | ORAL | Status: DC
  Administered 2015-01-11 – 2015-01-12 (×2): 80 mg via ORAL
  Filled 2015-01-11 (×2): qty 1

## 2015-01-11 MED ORDER — OXYCODONE-ACETAMINOPHEN 5-325 MG PO TABS: 1.0000 | ORAL_TABLET | ORAL | Status: DC | PRN

## 2015-01-11 MED ORDER — IBUPROFEN 600 MG PO TABS
600.0000 mg | ORAL_TABLET | Freq: Four times a day (QID) | ORAL | Status: DC
  Administered 2015-01-11 – 2015-01-13 (×7): 600 mg via ORAL
  Filled 2015-01-11 (×7): qty 1

## 2015-01-11 MED ORDER — DIPHENHYDRAMINE HCL 25 MG PO CAPS: 25.0000 mg | ORAL_CAPSULE | Freq: Four times a day (QID) | ORAL | Status: DC | PRN

## 2015-01-11 MED ORDER — OXYTOCIN 10 UNIT/ML IJ SOLN
INTRAMUSCULAR | Status: AC
  Filled 2015-01-11: qty 4

## 2015-01-11 MED ORDER — PRENATAL MULTIVITAMIN CH
1.0000 | ORAL_TABLET | Freq: Every day | ORAL | Status: DC
  Administered 2015-01-12: 1 via ORAL
  Filled 2015-01-11 (×2): qty 1

## 2015-01-11 MED ORDER — OXYTOCIN 40 UNITS IN LACTATED RINGERS INFUSION - SIMPLE MED: 62.5000 mL/h | INTRAVENOUS | Status: AC

## 2015-01-11 MED ORDER — SENNOSIDES-DOCUSATE SODIUM 8.6-50 MG PO TABS
2.0000 | ORAL_TABLET | ORAL | Status: DC
  Administered 2015-01-11 – 2015-01-12 (×2): 2 via ORAL
  Filled 2015-01-11 (×2): qty 2

## 2015-01-11 SURGICAL SUPPLY — 33 items
CLAMP CORD UMBIL (MISCELLANEOUS) IMPLANT
CLOTH BEACON ORANGE TIMEOUT ST (SAFETY) ×3 IMPLANT
DRAPE SHEET LG 3/4 BI-LAMINATE (DRAPES) IMPLANT
DRSG OPSITE POSTOP 4X10 (GAUZE/BANDAGES/DRESSINGS) ×3 IMPLANT
DURAPREP 26ML APPLICATOR (WOUND CARE) ×3 IMPLANT
ELECT REM PT RETURN 9FT ADLT (ELECTROSURGICAL) ×3
ELECTRODE REM PT RTRN 9FT ADLT (ELECTROSURGICAL) ×1 IMPLANT
EXTRACTOR VACUUM M CUP 4 TUBE (SUCTIONS) IMPLANT
EXTRACTOR VACUUM M CUP 4' TUBE (SUCTIONS)
GLOVE BIOGEL PI IND STRL 6.5 (GLOVE) ×1 IMPLANT
GLOVE BIOGEL PI IND STRL 7.0 (GLOVE) ×1 IMPLANT
GLOVE BIOGEL PI INDICATOR 6.5 (GLOVE) ×2
GLOVE BIOGEL PI INDICATOR 7.0 (GLOVE) ×2
GLOVE ECLIPSE 6.5 STRL STRAW (GLOVE) ×3 IMPLANT
GOWN STRL REUS W/TWL LRG LVL3 (GOWN DISPOSABLE) ×6 IMPLANT
KIT ABG SYR 3ML LUER SLIP (SYRINGE) IMPLANT
NEEDLE HYPO 25X5/8 SAFETYGLIDE (NEEDLE) IMPLANT
NS IRRIG 1000ML POUR BTL (IV SOLUTION) ×3 IMPLANT
PACK C SECTION WH (CUSTOM PROCEDURE TRAY) ×3 IMPLANT
PAD ABD 7.5X8 STRL (GAUZE/BANDAGES/DRESSINGS) IMPLANT
PAD OB MATERNITY 4.3X12.25 (PERSONAL CARE ITEMS) ×3 IMPLANT
PENCIL SMOKE EVAC W/HOLSTER (ELECTROSURGICAL) ×3 IMPLANT
RTRCTR C-SECT PINK 25CM LRG (MISCELLANEOUS) ×3 IMPLANT
STAPLER VISISTAT 35W (STAPLE) ×3 IMPLANT
SUT MON AB 2-0 CT1 27 (SUTURE) ×3 IMPLANT
SUT MON AB 4-0 PS1 27 (SUTURE) IMPLANT
SUT PDS AB 0 CTX 60 (SUTURE) IMPLANT
SUT PLAIN 2 0 XLH (SUTURE) IMPLANT
SUT VIC AB 0 CTX 36 (SUTURE) ×8
SUT VIC AB 0 CTX36XBRD ANBCTRL (SUTURE) ×4 IMPLANT
SUT VIC AB 4-0 KS 27 (SUTURE) IMPLANT
TOWEL OR 17X24 6PK STRL BLUE (TOWEL DISPOSABLE) ×3 IMPLANT
TRAY FOLEY CATH SILVER 14FR (SET/KITS/TRAYS/PACK) ×3 IMPLANT

## 2015-01-11 NOTE — Consult Note (Signed)
Neonatology Note:   Attendance at C-section:   I was asked by Dr. Callahan to attend this primary C/S at term due to arrest of descent. The mother is a G1P0 O pos, GBS neg with an uncomplicated pregnancy. ROM 11 hours prior to delivery, fluid clear. Mother had a temperature of 38.6 degrees just prior to delivery. She got one dose of Ampicillin and Gentamicin 1 hour before delivery. Infant vigorous with good spontaneous cry and tone. Needed bulb suctioning for some clear secretions and we performed chest PT, after which her lungs were clear. Ap 9/9. To CN to care of Pediatrician.  Arthor Gorter C. Devonn Giampietro, MD 

## 2015-01-11 NOTE — Lactation Note (Signed)
This note was copied from the chart of Courtney Alver Sorrownita Bauman. Lactation Consultation Note  Mom asking for formula and states they want to both breast and formula feed.  Risks of early formula feeding discussed.  Reassured mom that baby just finished a good feeding.   Reviewed infants stomach size.   Mom states she desires to nap.  Recommended FOB provide comfort measures to baby if needed.  Patient Name: Courtney Colon Today's Date: 01/11/2015 Reason for consult: Initial assessment   Maternal Data    Feeding Feeding Type: Breast Fed Length of feed: 12 min  LATCH Score/Interventions Latch: Grasps breast easily, tongue down, lips flanged, rhythmical sucking. Intervention(s): Adjust position;Assist with latch;Breast massage;Breast compression  Audible Swallowing: A few with stimulation Intervention(s): Skin to skin;Hand expression;Alternate breast massage  Type of Nipple: Everted at rest and after stimulation  Comfort (Breast/Nipple): Soft / non-tender     Hold (Positioning): Assistance needed to correctly position infant at breast and maintain latch. Intervention(s): Breastfeeding basics reviewed;Support Pillows;Position options;Skin to skin  LATCH Score: 8  Lactation Tools Discussed/Used     Consult Status Consult Status: Follow-up Date: 01/12/15 Follow-up type: In-patient    Huston FoleyMOULDEN, Teighlor Korson S 01/11/2015, 10:58 AM

## 2015-01-11 NOTE — Progress Notes (Signed)
ANTIBIOTIC CONSULT NOTE - INITIAL  Pharmacy Consult for Gentamicin Indication: Maternal temperature  No Known Allergies  Patient Measurements: Height: 4\' 10"  (147.3 cm) Weight: 138 lb (62.596 kg) IBW/kg (Calculated) : 40.9 Adjusted Body Weight: 47.4  Vital Signs: Temp: 101.4 F (38.6 C) (12/16 0400) Temp Source: Oral (12/16 0400) BP: 134/76 mmHg (12/16 0430) Pulse Rate: 134 (12/16 0430) Intake/Output from previous day: 12/15 0701 - 12/16 0700 In: -  Out: 350 [Urine:350] Intake/Output from this shift: Total I/O In: -  Out: 350 [Urine:350]  Labs:  Recent Labs  01/10/15 1755  WBC 18.3*  HGB 12.1  PLT 205   CrCl cannot be calculated (Patient has no serum creatinine result on file.). No results for input(s): VANCOTROUGH, VANCOPEAK, VANCORANDOM, GENTTROUGH, GENTPEAK, GENTRANDOM, TOBRATROUGH, TOBRAPEAK, TOBRARND, AMIKACINPEAK, AMIKACINTROU, AMIKACIN in the last 72 hours.   Microbiology: Recent Results (from the past 720 hour(s))  OB RESULT CONSOLE Group B Strep     Status: None   Collection Time: 12/28/14 12:00 AM  Result Value Ref Range Status   GBS Negative  Final    Medical History: Past Medical History  Diagnosis Date  . Medical history non-contributory     Medications:  Ampicillin 2 Gm IV every 6 hours Clindamycin 900 mg IV every 8 hours  Assessment: 19 yo G1P0 admitted at 4917w1d in labor; now with increased temperature to 101.4 and with antibiotics ordered prior to C-section delivery.  Goal of Therapy:  Gentamicin peaks 6-8 mcg/ml; troughs <1 mcg/ml.  Plan:  Gentamicin 120 mg IV every 8 hours Monitor renal function per protocol Serum gentamicin levels as indicated  Arelia SneddonMason, Chivon Lepage Anne 01/11/2015,5:30 AM

## 2015-01-11 NOTE — Anesthesia Postprocedure Evaluation (Signed)
Anesthesia Post Note  Patient: Courtney Colon  Procedure(s) Performed: Procedure(s) (LRB): CESAREAN SECTION (N/A)  Patient location during evaluation: Mother Baby Anesthesia Type: Epidural Level of consciousness: awake, awake and alert, oriented and patient cooperative Pain management: pain level controlled Vital Signs Assessment: post-procedure vital signs reviewed and stable Respiratory status: spontaneous breathing, nonlabored ventilation and respiratory function stable Cardiovascular status: stable Postop Assessment: no headache, no backache, patient able to bend at knees and no signs of nausea or vomiting Anesthetic complications: no    Last Vitals:  Filed Vitals:   01/11/15 1118 01/11/15 1433  BP: 112/71 114/66  Pulse: 76 72  Temp: 36.9 C 37.1 C  Resp: 16 16    Last Pain:  Filed Vitals:   01/11/15 1446  PainSc: 0-No pain                 [name] L

## 2015-01-11 NOTE — Addendum Note (Signed)
Addendum  created 01/11/15 1521 by Yolonda KidaAlison L Zayna Toste, CRNA   Modules edited: Clinical Notes   Clinical Notes:  File: [id number]

## 2015-01-11 NOTE — Progress Notes (Signed)
Pt called RN to room at about 1312.  Pt reported vomiting.  Watery green emesis noted in basin pt was holding.  MD on call, Dr. Henderson CloudHorvath, notified and ordered Zofran 8mg  Q8 PRN and Pepcid IV 20mg  Q 12 scheduled.  Will continue to monitor pt.

## 2015-01-11 NOTE — Brief Op Note (Signed)
01/10/2015 - 01/11/2015  6:12 AM  PATIENT:  Courtney SorrowAnita Jorden  19 y.o. female  PRE-OPERATIVE DIAGNOSIS:  arrest of descent  POST-OPERATIVE DIAGNOSIS:  Arrest of descent   PROCEDURE:  Procedure(s): CESAREAN SECTION (N/A) low transverse  SURGEON:  Surgeon(s) and Role:    * Philip AspenSidney Jacqulyn Barresi, DO - Primary    * Rhona RaiderJacob J Stinson, DO - Assisting  ANESTHESIA:   epidural  EBL:  Total I/O In: 2000 [I.V.:2000] Out: 1050 [Urine:450; Blood:600]  SPECIMEN:  Source of Specimen:  cord blood and placeta  DISPOSITION OF SPECIMEN:  PATHOLOGY  COUNTS:  YES  PLAN OF CARE: Admit to inpatient   PATIENT DISPOSITION:  PACU - hemodynamically stable.   Delay start of Pharmacological VTE agent (>24hrs) due to surgical blood loss or risk of bleeding: no

## 2015-01-11 NOTE — Transfer of Care (Signed)
Immediate Anesthesia Transfer of Care Note  Patient: Courtney Colon  Procedure(s) Performed: Procedure(s): CESAREAN SECTION (N/A)  Patient Location: PACU  Anesthesia Type:Epidural  Level of Consciousness: awake  Airway & Oxygen Therapy: Patient Spontanous Breathing  Post-op Assessment: Report given to RN and Post -op Vital signs reviewed and stable  Post vital signs: stable  Last Vitals:  Filed Vitals:   01/11/15 0430 01/11/15 0500  BP: 134/76 115/72  Pulse: 134 147  Temp:    Resp:      Complications: No apparent anesthesia complications

## 2015-01-11 NOTE — Anesthesia Postprocedure Evaluation (Signed)
Anesthesia Post Note  Patient: Courtney Colon  Procedure(s) Performed: Procedure(s) (LRB): CESAREAN SECTION (N/A)  Patient location during evaluation: PACU Anesthesia Type: Epidural Level of consciousness: awake and alert Pain management: pain level controlled Vital Signs Assessment: post-procedure vital signs reviewed and stable Respiratory status: spontaneous breathing, nonlabored ventilation and respiratory function stable Cardiovascular status: stable and blood pressure returned to baseline Postop Assessment: epidural receding and no backache Anesthetic complications: no    Last Vitals:  Filed Vitals:   01/11/15 0630 01/11/15 0645  BP: 98/61 114/55  Pulse: 108 103  Temp:    Resp: 18 18    Last Pain:  Filed Vitals:   01/11/15 0647  PainSc: 0-No pain                 Reino KentJudd, Sunita Demond J

## 2015-01-11 NOTE — Lactation Note (Signed)
This note was copied from the chart of Courtney Alver Sorrownita Macht. Lactation Consultation Note  Initial visit made.  Breastfeeding consultation services and support information given and reviewed with patient.  Baby is 5 hours old and she has been to breast twice.  Nursed well per mom.  Reviewed breastfeeding basics.  Mom states she did attend a breastfeeding class.  Baby fussy so I assisted with positioning baby in football hold skin to skin.  Instructed on hand expression and large drops easily obtained.  Baby opened wide and latched easily.  Observed good suckling and swallows.  Instructed on breast massage/compression to increase intake and keep baby active.  Encouraged to call with concerns/assist.  Patient Name: Courtney Colon Today's Date: 01/11/2015 Reason for consult: Initial assessment   Maternal Data    Feeding Feeding Type: Breast Fed Length of feed: 12 min  LATCH Score/Interventions Latch: Grasps breast easily, tongue down, lips flanged, rhythmical sucking. Intervention(s): Adjust position;Assist with latch;Breast massage;Breast compression  Audible Swallowing: A few with stimulation Intervention(s): Skin to skin;Hand expression;Alternate breast massage  Type of Nipple: Everted at rest and after stimulation  Comfort (Breast/Nipple): Soft / non-tender     Hold (Positioning): Assistance needed to correctly position infant at breast and maintain latch. Intervention(s): Breastfeeding basics reviewed;Support Pillows;Position options;Skin to skin  LATCH Score: 8  Lactation Tools Discussed/Used     Consult Status Consult Status: Follow-up Date: 01/12/15 Follow-up type: In-patient    Huston FoleyMOULDEN, Marek Nghiem S 01/11/2015, 10:45 AM

## 2015-01-11 NOTE — Progress Notes (Signed)
Pt pushed for one hour, developed fetal tachycardia, was IVF bolused and temp was normal.  Monitored and FHT baseline improved.  Pushing was restarted approx one hour later after laboring down.  She pushed an additional hour.  I examined her and found her to be -1 station with inadequate pushing and suspected CPD.  Discussed continuing TOL vs primary c/s with patient.  Reviewed R/B/Expecations of each option.  Pt decided to proceed with C/S.

## 2015-01-12 LAB — CBC
HEMATOCRIT: 30.6 % — AB (ref 36.0–46.0)
HEMOGLOBIN: 10.1 g/dL — AB (ref 12.0–15.0)
MCH: 28.5 pg (ref 26.0–34.0)
MCHC: 33 g/dL (ref 30.0–36.0)
MCV: 86.4 fL (ref 78.0–100.0)
Platelets: 157 10*3/uL (ref 150–400)
RBC: 3.54 MIL/uL — ABNORMAL LOW (ref 3.87–5.11)
RDW: 14.6 % (ref 11.5–15.5)
WBC: 18.9 10*3/uL — ABNORMAL HIGH (ref 4.0–10.5)

## 2015-01-12 MED ORDER — FAMOTIDINE 20 MG PO TABS
20.0000 mg | ORAL_TABLET | Freq: Two times a day (BID) | ORAL | Status: DC
  Administered 2015-01-12 – 2015-01-13 (×2): 20 mg via ORAL
  Filled 2015-01-12 (×2): qty 1

## 2015-01-12 NOTE — Progress Notes (Signed)
  Patient is eating, ambulating, voiding.  Pain control is good.  Filed Vitals:   01/11/15 1803 01/11/15 2205 01/12/15 0212 01/12/15 0611  BP:  114/58 105/65 105/51  Pulse:  86 84 76  Temp:  98.7 F (37.1 C) 98.3 F (36.8 C) 98 F (36.7 C)  TempSrc:    Oral  Resp:  18 18 16   Height:      Weight:      SpO2: 98% 96% 97%     lungs:   clear to auscultation cor:    RRR Abdomen:  soft, appropriate tenderness, incisions intact and without erythema or exudate ex:    no cords   Lab Results  Component Value Date   WBC 18.9* 01/12/2015   HGB 10.1* 01/12/2015   HCT 30.6* 01/12/2015   MCV 86.4 01/12/2015   PLT 157 01/12/2015    --/--/O POS, O POS (12/15 1755)/RI  A/P    Post operative day 1.  Routine post op and postpartum care.  Expect d/c routine.  Percocet for pain control.

## 2015-01-12 NOTE — Discharge Summary (Signed)
Obstetric Discharge Summary Reason for Admission: onset of labor Prenatal Procedures: NST Intrapartum Procedures: cesarean: low cervical, transverse Postpartum Procedures: none Complications-Operative and Postpartum: none HEMOGLOBIN  Date Value Ref Range Status  01/12/2015 10.1* 12.0 - 15.0 g/dL Final   HCT  Date Value Ref Range Status  01/12/2015 30.6* 36.0 - 46.0 % Final    Discharge Diagnoses: Term Pregnancy-delivered  Discharge Information: Date: 01/12/2015 Activity: pelvic rest Diet: routine Medications: Percocet Condition: stable Instructions: refer to practice specific booklet Discharge to: home Follow-up Information    Follow up with CALLAHAN, SIDNEY, DO In 4 weeks.   Specialty:  Obstetrics and Gynecology   Contact information:   [address] [place] [address] [phone]       Newborn Data: Live born female  Birth Weight: 6 lb 9.3 oz (2985 g) APGAR: 9, 9  Home with mother.  Erma Raiche A 01/12/2015, 9:34 AM

## 2015-01-13 MED ORDER — OXYCODONE-ACETAMINOPHEN 5-325 MG PO TABS: 1.0000 | ORAL_TABLET | Freq: Four times a day (QID) | ORAL | Status: DC | PRN

## 2015-01-13 MED ORDER — IBUPROFEN 600 MG PO TABS
600.0000 mg | ORAL_TABLET | Freq: Four times a day (QID) | ORAL | Status: DC
Start: 2015-01-13 — End: 2015-03-11

## 2015-01-13 NOTE — Progress Notes (Signed)
  Patient is eating, ambulating, voiding.  Pain control is good.  Lochia appropriate  Filed Vitals:   01/12/15 0212 01/12/15 0611 01/12/15 1708 01/13/15 0520  BP: 105/65 105/51 111/67 116/70  Pulse: 84 76 99 91  Temp: 98.3 F (36.8 C) 98 F (36.7 C) 97.6 F (36.4 C) 98.3 F (36.8 C)  TempSrc:  Oral Oral Oral  Resp: 18 16 18 17   Height:      Weight:      SpO2: 97%  99% 98%    lungs:   clear to auscultation cor:    RRR Abdomen:  soft, appropriate tenderness, incisions intact and without erythema or exudate ex:    no cords   Lab Results  Component Value Date   WBC 18.9* 01/12/2015   HGB 10.1* 01/12/2015   HCT 30.6* 01/12/2015   MCV 86.4 01/12/2015   PLT 157 01/12/2015    --/--/O POS, O POS (12/15 1755)/RI  A/P    Post operative day 2.  Routine post op and postpartum care.  Meeting all goals, desires d/c today  Percocet for pain control.  Staple removal prior to d/c

## 2015-01-13 NOTE — Discharge Instructions (Signed)

## 2015-01-14 ENCOUNTER — Encounter (HOSPITAL_COMMUNITY): Payer: Self-pay | Admitting: Obstetrics and Gynecology

## 2015-01-21 NOTE — Op Note (Signed)
NAMAlver Sorrow:  Colon, Courtney                  ACCOUNT NO.:  [id number]  MEDICAL RECORD NO.:  [id number]  LOCATION:  9129                          FACILITY:  [facility]  PHYSICIAN:  Philip AspenSidney Kingslee Dowse, DO    DATE OF BIRTH:  Aug 06, 1995  DATE OF PROCEDURE:  01/10/2015 DATE OF DISCHARGE:  01/13/2015                              OPERATIVE REPORT   PREOPERATIVE DIAGNOSIS:  Arrest of descent.  POSTOPERATIVE DIAGNOSIS:  Arrest of descent.  PROCEDURE:  Low-transverse cesarean section.  SURGEON:  Philip AspenSidney Dasie Chancellor, D.O.  ASSISTANT:  Candelaria CelesteJacob Stinson, D.O.  ANESTHESIA:  Epidural.  IV FLUIDS:  2000 mL.  URINE OUTPUT:  450 mL.  ESTIMATED BLOOD LOSS:  600 mL.  SPECIMENS:  Cord blood and placenta to pathology.  FINDINGS:  Female infant in cephalic presentation, with Apgars 9 and 9 and weight 6 pounds 9 ounces.  Normal tubes and ovaries bilaterally.  COMPLICATIONS:  None.  CONDITION:  Stable to PACU.  DESCRIPTION OF PROCEDURE:  The patient was taken to the operating room. The patient was unable to advance the fetal head with prolonged pushing. The patient was prepped and draped in a normal sterile fashion in dorsal supine position.  Pfannenstiel skin incision was made with a scalpel and carried down to the underlying layer of fascia with Bovie cautery.  The scalpel was used to make an incision in the fascia of the midline and extended laterally with Mayo scissors.  Kocher clamps were placed at the superior aspect of the fascial incision and the rectus muscles were dissected off bluntly and sharply.  Kocher clamps were then placed at the inferior aspect of fascial incision, and then rectus muscles were dissected off bluntly and sharply.  The rectus muscles were separated using hemostat and the peritoneum was identified and entered bluntly. The peritoneal incision was extended by manual traction laterally.  The abdomen and pelvis were manually surveyed and an Clinical research associateAlexis self retractor was placed.  A bladder flap  was performed by tenting the vesicouterine peritoneum, entering sharply and developing digitally.  A Pfannenstiel skin incision was made with a scalpel and the amniotic sac entered bluntly.  The infant's head was located low in the pelvis, elevated and delivered without difficulty followed by the remainder of the infant's body.  The cord was clamped and cut and infant was handed off to awaiting neonatology.  Cord blood was collected and external massage of the uterus with gentle traction on the umbilical cord, affect the delivery of placenta.  The uterus was cleared of all clot and debris. The uterine incision was closed with 2 layers, first layer running locked Vicryl suture, followed by a second layer of horizontal Lembert imbrication.  Excellent hemostasis was noted.  Both tubes and ovaries were identified and normal.  The Alexis self retractor was removed and the fascia was reapproximated and closed in a running fashion. Subcutaneous tissue was irrigated, dried, and found to be hemostats static with minimal use of Bovie cautery.  The skin was reapproximated and closed with staples.  The patient tolerated the procedure well. Sponge, lap, and needle counts were correct x2.  The patient was taken to recovery in stable condition.  ______________________________ Philip AspenSidney Yosef Krogh, DO     [place]/MEDQ  D:  01/21/2015  T:  01/21/2015  Job:  [id number]

## 2015-01-21 NOTE — Op Note (Deleted)
NAMEJYSSICA, RIEF                  ACCOUNT NO.:  [id number]  MEDICAL RECORD NO.:  [id number]  LOCATION:  ZO10                          FACILITY:  [facility]  PHYSICIAN:  Philip Aspen, DO    DATE OF BIRTH:  [birth year]  DATE OF PROCEDURE:  01/10/2015 DATE OF DISCHARGE:  01/10/2015                              OPERATIVE REPORT   PREOPERATIVE DIAGNOSIS:  Arrest of descent.  POSTOPERATIVE DIAGNOSIS:  Arrest of descent.  PROCEDURE:  Low-transverse cesarean section.  SURGEON:  Philip Aspen, D.O.  ASSISTANT:  Candelaria Celeste, D.O.  ANESTHESIA:  Epidural.  IV FLUIDS:  2000 mL.  URINE OUTPUT:  450 mL.  ESTIMATED BLOOD LOSS:  600 mL.  SPECIMENS:  Cord blood and placenta to pathology.  FINDINGS:  Female infant in cephalic presentation, with Apgars 9 and 9 and weight 6 pounds 9 ounces.  Normal tubes and ovaries bilaterally.  COMPLICATIONS:  None.  CONDITION:  Stable to PACU.  DESCRIPTION OF PROCEDURE:  The patient was taken to the operating room. The patient was unable to advance the fetal head with prolonged pushing. The patient was prepped and draped in a normal sterile fashion in dorsal supine position.  Pfannenstiel skin incision was made with a scalpel and carried down to the underlying layer of fascia with Bovie cautery.  The scalpel was used to make an incision in the fascia of the midline and extended laterally with Mayo scissors.  Kocher clamps were placed at the superior aspect of the fascial incision and the rectus muscles were dissected off bluntly and sharply.  Kocher clamps were then placed at the inferior aspect of fascial incision, and then rectus muscles were dissected off bluntly and sharply.  The rectus muscles were separated using hemostat and the peritoneum was identified and entered bluntly. The peritoneal incision was extended by manual traction laterally.  The abdomen and pelvis were manually surveyed and an Clinical research associate was placed.  A bladder flap  was performed by tenting the vesicouterine peritoneum, entering sharply and developing digitally.  A Pfannenstiel skin incision was made with a scalpel and the amniotic sac entered bluntly.  The infant's head was located low in the pelvis, elevated and delivered without difficulty followed by the remainder of the infant's body.  The cord was clamped and cut and infant was handed off to awaiting neonatology.  Cord blood was collected and external massage of the uterus with gentle traction on the umbilical cord, affect the delivery of placenta.  The uterus was cleared of all clot and debris. The uterine incision was closed with 2 layers, first layer running locked Vicryl suture, followed by a second layer of horizontal Lembert imbrication.  Excellent hemostasis was noted.  Both tubes and ovaries were identified and normal.  The Alexis self retractor was removed and the fascia was reapproximated and closed in a running fashion. Subcutaneous tissue was irrigated, dried, and found to be hemostats static with minimal use of Bovie cautery.  The skin was reapproximated and closed with staples.  The patient tolerated the procedure well. Sponge, lap, and needle counts were correct x2.  The patient was taken to recovery in stable condition.  ______________________________ Philip AspenSidney Vibha Ferdig, DO     [place]/MEDQ  D:  01/21/2015  T:  01/21/2015  Job:  [id number]

## 2015-03-11 ENCOUNTER — Encounter (HOSPITAL_COMMUNITY): Payer: Self-pay | Admitting: *Deleted

## 2015-03-11 ENCOUNTER — Emergency Department (INDEPENDENT_AMBULATORY_CARE_PROVIDER_SITE_OTHER)
Admission: EM | Admit: 2015-03-11 | Discharge: 2015-03-11 | Disposition: A | Discharge status: Home / Self Care | Payer: No Typology Code available for payment source | Source: Home / Self Care | Attending: Emergency Medicine | Admitting: Emergency Medicine

## 2015-03-11 DIAGNOSIS — J069 Acute upper respiratory infection, unspecified: Secondary | ICD-10-CM | POA: Diagnosis not present

## 2015-03-11 DIAGNOSIS — B9789 Other viral agents as the cause of diseases classified elsewhere: Principal | ICD-10-CM

## 2015-03-11 MED ORDER — FLUTICASONE PROPIONATE 50 MCG/ACT NA SUSP: 2.0000 | Freq: Every day | NASAL | Status: DC

## 2015-03-11 MED ORDER — CETIRIZINE HCL 10 MG PO TABS: 10.0000 mg | ORAL_TABLET | Freq: Every day | ORAL | Status: DC

## 2015-03-11 NOTE — ED Notes (Signed)
Pt  Reports  Symptoms  Of  Cough  /  congestion       sorethroat   And      Sinus  Drainage         Pt  Reports         Symptoms    X    4  Days

## 2015-03-11 NOTE — Discharge Instructions (Signed)
You have a nasty cold virus. There is no sign of ear infection, strep throat, or pneumonia. Drink plenty of fluids. This is especially important since you are breast-feeding. Take Zyrtec daily for the next week. Use Flonase daily for the next week. You should start to feel better in the next 2-3 days. The cough may linger for a week or 2. If you develop fevers, trouble breathing, or are just getting worse, please come back.

## 2015-03-11 NOTE — ED Provider Notes (Signed)
CSN: [id number]     Arrival date & time 03/11/15  1310 History   First MD Initiated Contact with Patient 03/11/15 1334     Chief Complaint  Patient presents with  . URI   (Consider location/radiation/quality/duration/timing/severity/associated sxs/prior Treatment) HPI  She is a 20 year old woman here for evaluation of sinus drainage and cough. Her symptoms started about 4 days ago. She reports nasal congestion, rhinorrhea, postnasal drainage, sore throat, and cough. She denies any fevers or chills. No nausea or vomiting. She does report a decreased appetite and decreased fluid intake. No ear pain. No shortness of breath. She has been taking Robitussin, which she states has improved the sore throat. She is breast-feeding.  Past Medical History  Diagnosis Date  . Medical history non-contributory    Past Surgical History  Procedure Laterality Date  . Wisdom tooth extraction    . Mouth surgery  2011  . Cesarean section N/A 01/11/2015    Procedure: CESAREAN SECTION;  Surgeon: Philip Aspen, DO;  Location: [facility];  Service: Obstetrics;  Laterality: N/A;   History reviewed. No pertinent family history. Social History  Substance Use Topics  . Smoking status: Never Smoker   . Smokeless tobacco: None  . Alcohol Use: No   OB History    Gravida Para Term Preterm AB TAB SAB Ectopic Multiple Living   0 1     Review of Systems As in history of present illness Allergies  Review of patient's allergies indicates no known allergies.  Home Medications   Prior to Admission medications   Medication Sig Start Date End Date Taking? Authorizing Provider  cetirizine (ZYRTEC) 10 MG tablet Take 1 tablet (10 mg total) by mouth daily. 03/11/15   Charm Rings, MD  fluticasone (FLONASE) 50 MCG/ACT nasal spray Place 2 sprays into both nostrils daily. 03/11/15   Charm Rings, MD   Meds Ordered and Administered this Visit  Medications - No data to display  BP 137/91 mmHg  Pulse 124   Temp(Src) 98.7 F (37.1 C) (Oral)  Resp 16  SpO2 100% No data found.   Physical Exam  Constitutional: She is oriented to person, place, and time. She appears well-developed and well-nourished. No distress.  HENT:  Mouth/Throat: No oropharyngeal exudate.  We are nasal discharge present. Clear postnasal drainage seen. TMs normal bilaterally.  Neck: Neck supple.  Cardiovascular: Regular rhythm and normal heart sounds.   No murmur heard. Tachycardic  Pulmonary/Chest: Effort normal and breath sounds normal. No respiratory distress. She has no wheezes. She has no rales.  Lymphadenopathy:    She has no cervical adenopathy.  Neurological: She is alert and oriented to person, place, and time.    ED Course  Procedures (including critical care time)  Labs Review Labs Reviewed - No data to display  Imaging Review No results found.    MDM   1. Viral URI with cough    History and exam consistent with viral illness. Symptomatic treatment with Zyrtec and Flonase. She is somewhat tachycardic. I suspect she has some mild dehydration given her relatively poor fluid intake the last few days. I've emphasized the importance of increasing her fluid intake. She does also state her pulse tends to run high. Return precautions reviewed.    Charm Rings, MD 03/11/15 [phone]

## 2015-10-02 ENCOUNTER — Inpatient Hospital Stay (HOSPITAL_COMMUNITY)
Admission: AD | Admit: 2015-10-02 | Discharge: 2015-10-02 | Disposition: A | Discharge status: Home / Self Care | Payer: Medicaid Other | Source: Ambulatory Visit | Attending: Obstetrics and Gynecology | Admitting: Obstetrics and Gynecology

## 2015-10-02 ENCOUNTER — Encounter (HOSPITAL_COMMUNITY): Payer: Self-pay | Admitting: *Deleted

## 2015-10-02 DIAGNOSIS — Z3A20 20 weeks gestation of pregnancy: Secondary | ICD-10-CM | POA: Diagnosis not present

## 2015-10-02 DIAGNOSIS — O26892 Other specified pregnancy related conditions, second trimester: Secondary | ICD-10-CM | POA: Diagnosis not present

## 2015-10-02 DIAGNOSIS — O26899 Other specified pregnancy related conditions, unspecified trimester: Secondary | ICD-10-CM

## 2015-10-02 DIAGNOSIS — Z79899 Other long term (current) drug therapy: Secondary | ICD-10-CM | POA: Insufficient documentation

## 2015-10-02 DIAGNOSIS — R109 Unspecified abdominal pain: Secondary | ICD-10-CM | POA: Diagnosis present

## 2015-10-02 DIAGNOSIS — O2342 Unspecified infection of urinary tract in pregnancy, second trimester: Secondary | ICD-10-CM

## 2015-10-02 LAB — WET PREP, GENITAL
Clue Cells Wet Prep HPF POC: NONE SEEN
SPERM: NONE SEEN
Trich, Wet Prep: NONE SEEN
Yeast Wet Prep HPF POC: NONE SEEN

## 2015-10-02 LAB — URINE MICROSCOPIC-ADD ON
RBC / HPF: NONE SEEN RBC/hpf (ref 0–5)
SQUAMOUS EPITHELIAL / LPF: NONE SEEN

## 2015-10-02 LAB — URINALYSIS, ROUTINE W REFLEX MICROSCOPIC
Bilirubin Urine: NEGATIVE
Glucose, UA: NEGATIVE mg/dL
Ketones, ur: NEGATIVE mg/dL
Nitrite: POSITIVE — AB
Protein, ur: >300 mg/dL — AB
pH: 6 (ref 5.0–8.0)

## 2015-10-02 LAB — CBC
HEMATOCRIT: 32.5 % — AB (ref 36.0–46.0)
HEMOGLOBIN: 11.4 g/dL — AB (ref 12.0–15.0)
MCH: 30.6 pg (ref 26.0–34.0)
MCHC: 35.1 g/dL (ref 30.0–36.0)
MCV: 87.1 fL (ref 78.0–100.0)
Platelets: 199 10*3/uL (ref 150–400)
RBC: 3.73 MIL/uL — ABNORMAL LOW (ref 3.87–5.11)
RDW: 13.2 % (ref 11.5–15.5)
WBC: 14.1 10*3/uL — ABNORMAL HIGH (ref 4.0–10.5)

## 2015-10-02 MED ORDER — NITROFURANTOIN MONOHYD MACRO 100 MG PO CAPS: 100.0000 mg | ORAL_CAPSULE | Freq: Two times a day (BID) | ORAL | 0 refills | Status: DC

## 2015-10-02 NOTE — MAU Note (Signed)
Been having some lower cramps and low back (started yesterday).  Has been having some discomfort urinating (started today) and constipation.(had one today). Has not been seen yet with preg

## 2015-10-02 NOTE — MAU Provider Note (Signed)
History     CSN: [id number]  Arrival date and time: 10/02/15 1432   First Provider Initiated Contact with Patient 10/02/15 1650      Chief Complaint  Patient presents with  . Abdominal Cramping  . Dysuria   HPI Courtney Colon is a 20 y.o. G2P1001 at [redacted]w[redacted]d who presents with dysuria & abdominal cramping. Symptoms began yesterday. Lower abdominal cramping is intermittent since yesterday. Rates pain 6/10. Has not treated. Denies LOF or vaginal bleeding. Also reports dysuria, increased frequency, & foul smelling urine since yesterday. Denies fever/chills, flank pain, hematuria, or n/v. Has not started prenatal care. States waiting on pregnancy medicaid.   OB History    Gravida Para Term Preterm AB Living   2 1 1     1    SAB TAB Ectopic Multiple Live Births         0 1      Past Medical History:  Diagnosis Date  . Medical history non-contributory     Past Surgical History:  Procedure Laterality Date  . CESAREAN SECTION N/A 01/11/2015   Procedure: CESAREAN SECTION;  Surgeon: Philip Aspen, DO;  Location: [facility];  Service: Obstetrics;  Laterality: N/A;  . MOUTH SURGERY  2011  . WISDOM TOOTH EXTRACTION      History reviewed. No pertinent family history.  Social History  Substance Use Topics  . Smoking status: Never Smoker  . Smokeless tobacco: Never Used  . Alcohol use No    Allergies: No Known Allergies  Prescriptions Prior to Admission  Medication Sig Dispense Refill Last Dose  . acetaminophen (TYLENOL) 325 MG tablet Take 650 mg by mouth every 6 (six) hours as needed for mild pain.   10/02/2015 at Unknown time    Review of Systems  Constitutional: Negative for chills and fever.  Gastrointestinal: Positive for abdominal pain and constipation. Negative for diarrhea, nausea and vomiting.  Genitourinary: Positive for dysuria, frequency and urgency. Negative for flank pain and hematuria.       No vaginal bleeding or LOF   Physical Exam   Blood pressure 121/65, pulse 100,  temperature 97.8 F (36.6 C), temperature source Oral, resp. rate 18, weight 113 lb 4 oz (51.4 kg), last menstrual period 05/13/2015, unknown if currently breastfeeding.  Physical Exam  Nursing note and vitals reviewed. Constitutional: She is oriented to person, place, and time. She appears well-developed and well-nourished. No distress.  HENT:  Head: Normocephalic and atraumatic.  Eyes: Conjunctivae are normal. Right eye exhibits no discharge. Left eye exhibits no discharge. No scleral icterus.  Neck: Normal range of motion.  Respiratory: Effort normal. No respiratory distress.  GI: Soft. There is no tenderness. There is no CVA tenderness.  Fundal height 20 cm  Neurological: She is alert and oriented to person, place, and time.  Skin: Skin is warm and dry. She is not diaphoretic.  Psychiatric: She has a normal mood and affect. Her behavior is normal. Judgment and thought content normal.   Dilation: Closed Effacement (%): Thick Cervical Position: Posterior Station: -3 Exam by:: Judeth Horn NP  MAU Course  Procedures Results for orders placed or performed during the hospital encounter of 10/02/15 (from the past 24 hour(s))  Urinalysis, Routine w reflex microscopic (not at [facility])     Status: Abnormal   Collection Time: 10/02/15  3:50 PM  Result Value Ref Range   Color, Urine YELLOW YELLOW   APPearance TURBID (A) CLEAR   Specific Gravity, Urine >1.030 (H) 1.005 - 1.030   pH 6.0  5.0 - 8.0   Glucose, UA NEGATIVE NEGATIVE mg/dL   Hgb urine dipstick LARGE (A) NEGATIVE   Bilirubin Urine NEGATIVE NEGATIVE   Ketones, ur NEGATIVE NEGATIVE mg/dL   Protein, ur >130>300 (A) NEGATIVE mg/dL   Nitrite POSITIVE (A) NEGATIVE   Leukocytes, UA MODERATE (A) NEGATIVE  Urine microscopic-add on     Status: Abnormal   Collection Time: 10/02/15  3:50 PM  Result Value Ref Range   Squamous Epithelial / LPF NONE SEEN NONE SEEN   WBC, UA TOO NUMEROUS TO COUNT 0 - 5 WBC/hpf   RBC / HPF NONE SEEN 0 - 5  RBC/hpf   Bacteria, UA MANY (A) NONE SEEN   Urine-Other FIELD OBSCURED BY WBC'S   CBC     Status: Abnormal   Collection Time: 10/02/15  4:37 PM  Result Value Ref Range   WBC 14.1 (H) 4.0 - 10.5 K/uL   RBC 3.73 (L) 3.87 - 5.11 MIL/uL   Hemoglobin 11.4 (L) 12.0 - 15.0 g/dL   HCT 86.532.5 (L) 78.436.0 - 69.646.0 %   MCV 87.1 78.0 - 100.0 fL   MCH 30.6 26.0 - 34.0 pg   MCHC 35.1 30.0 - 36.0 g/dL   RDW 29.513.2 28.411.5 - 13.215.5 %   Platelets 199 150 - 400 K/uL  Wet prep, genital     Status: Abnormal   Collection Time: 10/02/15  5:04 PM  Result Value Ref Range   Yeast Wet Prep HPF POC NONE SEEN NONE SEEN   Trich, Wet Prep NONE SEEN NONE SEEN   Clue Cells Wet Prep HPF POC NONE SEEN NONE SEEN   WBC, Wet Prep HPF POC MODERATE (A) NONE SEEN   Sperm NONE SEEN     MDM FHT 158 by doppler Cervix closed Urine culture sent GC/CT, wet prep Assessment and Plan  A: 1. UTI (urinary tract infection) during pregnancy, second trimester   2. Abdominal pain in pregnancy     P: Discharge home Urine culture & GC/CT pending Rx macrobid Start prenatal care ASAP Discussed reasons to return to MAU  Judeth HornErin Calissa Swenor 10/02/2015, 4:32 PM

## 2015-10-02 NOTE — Discharge Instructions (Signed)
Pregnancy and Urinary Tract Infection  A urinary tract infection (UTI) is a bacterial infection of the urinary tract. Infection of the urinary tract can include the ureters, kidneys (pyelonephritis), bladder (cystitis), and urethra (urethritis). All pregnant women should be screened for bacteria in the urinary tract. Identifying and treating a UTI will decrease the risk of preterm labor and developing more serious infections in both the mother and baby.  CAUSES  Bacteria germs cause almost all UTIs.   RISK FACTORS  Many factors can increase your chances of getting a UTI during pregnancy. These include:  · Having a short urethra.  · Poor toilet and hygiene habits.  · Sexual intercourse.  · Blockage of urine along the urinary tract.  · Problems with the pelvic muscles or nerves.  · Diabetes.  · Obesity.  · Bladder problems after having several children.  · Previous history of UTI.  SIGNS AND SYMPTOMS   · Pain, burning, or a stinging feeling when urinating.  · Suddenly feeling the need to urinate right away (urgency).  · Loss of bladder control (urinary incontinence).  · Frequent urination, more than is common with pregnancy.  · Lower abdominal or back discomfort.  · Cloudy urine.  · Blood in the urine (hematuria).  · Fever.   When the kidneys are infected, the symptoms may be:  · Back pain.  · Flank pain on the right side more so than the left.  · Fever.  · Chills.  · Nausea.  · Vomiting.  DIAGNOSIS   A urinary tract infection is usually diagnosed through urine tests. Additional tests and procedures are sometimes done. These may include:  · Ultrasound exam of the kidneys, ureters, bladder, and urethra.  · Looking in the bladder with a lighted tube (cystoscopy).  TREATMENT  Typically, UTIs can be treated with antibiotic medicines.   HOME CARE INSTRUCTIONS   · Only take over-the-counter or prescription medicines as directed by your health care provider. If you were prescribed antibiotics, take them as directed. Finish  them even if you start to feel better.  · Drink enough fluids to keep your urine clear or pale yellow.  · Do not have sexual intercourse until the infection is gone and your health care provider says it is okay.  · Make sure you are tested for UTIs throughout your pregnancy. These infections often come back.   Preventing a UTI in the Future  · Practice good toilet habits. Always wipe from front to back. Use the tissue only once.  · Do not hold your urine. Empty your bladder as soon as possible when the urge comes.  · Do not douche or use deodorant sprays.  · Wash with soap and warm water around the genital area and the anus.  · Empty your bladder before and after sexual intercourse.  · Wear underwear with a cotton crotch.  · Avoid caffeine and carbonated drinks. They can irritate the bladder.  · Drink cranberry juice or take cranberry pills. This may decrease the risk of getting a UTI.  · Do not drink alcohol.  · Keep all your appointments and tests as scheduled.   SEEK MEDICAL CARE IF:   · Your symptoms get worse.  · You are still having fevers 2 or more days after treatment begins.  · You have a rash.  · You feel that you are having problems with medicines prescribed.  · You have abnormal vaginal discharge.  SEEK IMMEDIATE MEDICAL CARE IF:   · You have back or flank   pain.  · You have chills.  · You have blood in your urine.  · You have nausea and vomiting.  · You have contractions of your uterus.  · You have a gush of fluid from the vagina.  MAKE SURE YOU:  · Understand these instructions.    · Will watch your condition.    · Will get help right away if you are not doing well or get worse.       This information is not intended to replace advice given to you by your health care provider. Make sure you discuss any questions you have with your health care provider.     Document Released: 05/09/2010 Document Revised: 11/02/2012 Document Reviewed: 08/11/2012  Elsevier Interactive Patient Education ©2016 [employer]vier  Inc[employer]

## 2015-10-03 LAB — GC/CHLAMYDIA PROBE AMP (~~LOC~~) NOT AT ARMC
CHLAMYDIA, DNA PROBE: NEGATIVE
NEISSERIA GONORRHEA: NEGATIVE

## 2015-10-04 LAB — CULTURE, OB URINE

## 2015-10-05 ENCOUNTER — Telehealth: Payer: Self-pay | Admitting: Advanced Practice Midwife

## 2015-10-05 NOTE — Telephone Encounter (Signed)
Encounter opened in error.  Pt treated on 9/6 for UTI with Macrobid, which should cover E coli.  Pt to f/u with prenatal care as planned.

## 2015-12-04 LAB — OB RESULTS CONSOLE ABO/RH: RH Type: POSITIVE

## 2015-12-04 LAB — OB RESULTS CONSOLE ANTIBODY SCREEN: Antibody Screen: NEGATIVE

## 2015-12-04 LAB — OB RESULTS CONSOLE HIV ANTIBODY (ROUTINE TESTING): HIV: NONREACTIVE

## 2015-12-04 LAB — OB RESULTS CONSOLE GC/CHLAMYDIA
Chlamydia: NEGATIVE
GC PROBE AMP, GENITAL: NEGATIVE

## 2015-12-04 LAB — OB RESULTS CONSOLE RPR: RPR: NONREACTIVE

## 2015-12-04 LAB — OB RESULTS CONSOLE HEPATITIS B SURFACE ANTIGEN: Hepatitis B Surface Ag: NEGATIVE

## 2015-12-04 LAB — OB RESULTS CONSOLE RUBELLA ANTIBODY, IGM: RUBELLA: IMMUNE

## 2016-01-27 NOTE — L&D Delivery Note (Signed)
Delivery Note  First Stage: Labor onset: 1700 - TOLAC Augmentation : none Analgesia /Anesthesia intrapartum: epidural AROM at 0029  Second Stage: Complete dilation at 0016 Onset of pushing at 0130 FHR second stage category 2 with variable decels  Delivery of a viable female at 0202 by CNM in ROA position no nuchal cord Cord double clamped after cessation of pulsation, cut by FOB Cord blood sample collected  VBAC  Arterial cord blood sample due to variable decels and delay in newborn cry - stimulation and bulb suction on maternal chest  Third [facility]: [facility] @ 0210 Placenta disposition: hospital disposal Uterine tone firm / bleeding small  2nd degree perineal laceration identified  Anesthesia for repair: epidural Repair 3-0 VR interrupted muscle repair with 4-0 vicryl subcuticular repair Est. Blood Loss (mL): 200  Complications: none  Mom to postpartum.  Baby to Couplet care / Skin to Skin.  Newborn: Birth Weight: 9 pounds 7.1oz Apgar Scores: 7-8 Feeding planned: breast  [name], TANYA CNM, MSN, FACNM 02/25/2016, 2:29 AM

## 2016-01-28 LAB — OB RESULTS CONSOLE GBS: GBS: NEGATIVE

## 2016-02-06 ENCOUNTER — Inpatient Hospital Stay (HOSPITAL_COMMUNITY)
Admission: AD | Admit: 2016-02-06 | Discharge: 2016-02-06 | Disposition: A | Discharge status: Home / Self Care | Payer: No Typology Code available for payment source | Source: Ambulatory Visit | Attending: Obstetrics and Gynecology | Admitting: Obstetrics and Gynecology

## 2016-02-06 DIAGNOSIS — Z3A38 38 weeks gestation of pregnancy: Secondary | ICD-10-CM | POA: Insufficient documentation

## 2016-02-06 DIAGNOSIS — O26899 Other specified pregnancy related conditions, unspecified trimester: Secondary | ICD-10-CM | POA: Diagnosis present

## 2016-02-06 DIAGNOSIS — O34219 Maternal care for unspecified type scar from previous cesarean delivery: Secondary | ICD-10-CM | POA: Diagnosis present

## 2016-02-06 DIAGNOSIS — R109 Unspecified abdominal pain: Secondary | ICD-10-CM | POA: Insufficient documentation

## 2016-02-06 DIAGNOSIS — O26893 Other specified pregnancy related conditions, third trimester: Secondary | ICD-10-CM | POA: Insufficient documentation

## 2016-02-06 LAB — COMPREHENSIVE METABOLIC PANEL
ALT: 12 U/L — ABNORMAL LOW (ref 14–54)
ANION GAP: 9 (ref 5–15)
AST: 19 U/L (ref 15–41)
Albumin: 2.9 g/dL — ABNORMAL LOW (ref 3.5–5.0)
Alkaline Phosphatase: 177 U/L — ABNORMAL HIGH (ref 38–126)
BUN: 12 mg/dL (ref 6–20)
CO2: 20 mmol/L — ABNORMAL LOW (ref 22–32)
Calcium: 8.6 mg/dL — ABNORMAL LOW (ref 8.9–10.3)
Chloride: 105 mmol/L (ref 101–111)
Creatinine, Ser: 0.42 mg/dL — ABNORMAL LOW (ref 0.44–1.00)
Glucose, Bld: 87 mg/dL (ref 65–99)
POTASSIUM: 3.7 mmol/L (ref 3.5–5.1)
Sodium: 134 mmol/L — ABNORMAL LOW (ref 135–145)
TOTAL PROTEIN: 7.2 g/dL (ref 6.5–8.1)
Total Bilirubin: 0.7 mg/dL (ref 0.3–1.2)

## 2016-02-06 LAB — URINALYSIS, ROUTINE W REFLEX MICROSCOPIC
Bilirubin Urine: NEGATIVE
GLUCOSE, UA: NEGATIVE mg/dL
HGB URINE DIPSTICK: NEGATIVE
Ketones, ur: 5 mg/dL — AB
NITRITE: NEGATIVE
PH: 6 (ref 5.0–8.0)
Protein, ur: 30 mg/dL — AB
Specific Gravity, Urine: 1.024 (ref 1.005–1.030)

## 2016-02-06 LAB — CBC WITH DIFFERENTIAL/PLATELET
Basophils Absolute: 0 10*3/uL (ref 0.0–0.1)
Basophils Relative: 0 %
Eosinophils Absolute: 0.1 10*3/uL (ref 0.0–0.7)
Eosinophils Relative: 1 %
HCT: 34.6 % — ABNORMAL LOW (ref 36.0–46.0)
Hemoglobin: 11.4 g/dL — ABNORMAL LOW (ref 12.0–15.0)
LYMPHS PCT: 19 %
Lymphs Abs: 1.7 10*3/uL (ref 0.7–4.0)
MCH: 28.2 pg (ref 26.0–34.0)
MCHC: 32.9 g/dL (ref 30.0–36.0)
MCV: 85.6 fL (ref 78.0–100.0)
MONO ABS: 0.5 10*3/uL (ref 0.1–1.0)
MONOS PCT: 5 %
NEUTROS ABS: 6.6 10*3/uL (ref 1.7–7.7)
Neutrophils Relative %: 75 %
Platelets: 189 10*3/uL (ref 150–400)
RBC: 4.04 MIL/uL (ref 3.87–5.11)
RDW: 16.2 % — AB (ref 11.5–15.5)
WBC: 8.8 10*3/uL (ref 4.0–10.5)

## 2016-02-06 MED ORDER — ONDANSETRON 4 MG PO TBDP
4.0000 mg | ORAL_TABLET | Freq: Once | ORAL | Status: AC
  Administered 2016-02-06: 4 mg via ORAL
  Filled 2016-02-06: qty 1

## 2016-02-06 MED ORDER — ONDANSETRON 4 MG PO TBDP: 4.0000 mg | ORAL_TABLET | ORAL | 0 refills | Status: DC | PRN

## 2016-02-06 NOTE — MAU Provider Note (Signed)
  History     CSN: [id number]  Arrival date and time: 02/06/16 0901     Chief Complaint  Patient presents with  . Abdominal Pain  . Emesis During Pregnancy   G2P1001 at 4847w3d w/ c/o pain in upper abdomen with awakening this AM. Reports pain initially constant, then resolved for short while, now returning intermittently and moving lower in abdomen. + nausea, no emesis or diarhhea. Regular BM last night. No Hx of HB/GERD  Ctx occasional, not painful. + FM, no LOF or VB. No one else in household w/ similar symptoms.  Previous C/S for arrest of descent, plans TOLAC.    Abdominal Pain     OB History    Gravida Para Term Preterm AB Living   2 1 1     1    SAB TAB Ectopic Multiple Live Births         0 1      Past Medical History:  Diagnosis Date  . Medical history non-contributory     Past Surgical History:  Procedure Laterality Date  . CESAREAN SECTION N/A 01/11/2015   Procedure: CESAREAN SECTION;  Surgeon: Philip AspenSidney Callahan, DO;  Location: [facility];  Service: Obstetrics;  Laterality: N/A;  . MOUTH SURGERY  2011  . WISDOM TOOTH EXTRACTION      No family history on file.  Social History  Substance Use Topics  . Smoking status: Never Smoker  . Smokeless tobacco: Never Used  . Alcohol use No    Allergies: No Known Allergies  Prescriptions Prior to Admission  Medication Sig Dispense Refill Last Dose  . ferrous sulfate 325 (65 FE) MG tablet Take 325 mg by mouth daily with breakfast.   02/05/2016 at Unknown time  . Prenatal Vit-Fe Fumarate-FA (PRENATAL MULTIVITAMIN) TABS tablet Take 1 tablet by mouth daily at 12 noon.   02/05/2016 at Unknown time    Review of Systems  Gastrointestinal: Positive for abdominal pain.   Physical Exam   Blood pressure 113/79, pulse 101, temperature 97.7 F (36.5 C), resp. rate 18, last menstrual period 05/13/2015, unknown if currently breastfeeding.  Physical Exam  Nursing note and vitals reviewed. Constitutional: She is oriented to  person, place, and time. She appears well-developed. No distress.  Cardiovascular: Normal rate.   Respiratory: Effort normal.  GI: Soft. She exhibits no distension. There is no rebound and no guarding.  Gravid, S=D  Genitourinary:  Genitourinary Comments: deferred  Musculoskeletal: She exhibits no edema.  Neurological: She is alert and oriented to person, place, and time.  Skin: Skin is warm and dry.  Psychiatric: She has a normal mood and affect.    MAU Course  Procedures  MDM Labs pendind UA, CBC, CMP  Assessment and Plan  G2P1 at 5047w3d Abdominal pain NIL Previous C/S, plans TOLAC FHT category 1  Suspect abdominal pain likely from gas, discussed comfort measures.  Zofran ODT for nausea now  Discussed possibility of GI colic from viral vs food indigestion, to call if fever develops or emesis unrelieved by antinausea meds. Will DC home pending lab results [facility]  [facility]ult Dr. Cherly Hensenousins.   Neta MendsDaniela C Monquie Fulgham, CNM 02/06/2016, 10:11 AM

## 2016-02-06 NOTE — MAU Note (Signed)
Pt was feeling pain in her upper abdomen. Also felt nauseous and vomited No diarrhea. No bleeding or LOF

## 2016-02-06 NOTE — Discharge Instructions (Signed)
Abdominal Pain During Pregnancy °Belly (abdominal) pain is common during pregnancy. Most of the time, it is not a serious problem. Other times, it can be a sign that something is wrong with the pregnancy. Always tell your doctor if you have belly pain. °Follow these instructions at home: °Monitor your belly pain for any changes. The following actions may help you feel better: °· Do not have sex (intercourse) or put anything in your vagina until you feel better. °· Rest until your pain stops. °· Drink clear fluids if you feel sick to your stomach (nauseous). Do not eat solid food until you feel better. °· Only take medicine as told by your doctor. °· Keep all doctor visits as told. °Get help right away if: °· You are bleeding, leaking fluid, or pieces of tissue come out of your vagina. °· You have more pain or cramping. °· You keep throwing up (vomiting). °· You have pain when you pee (urinate) or have blood in your pee. °· You have a fever. °· You do not feel your baby moving as much. °· You feel very weak or feel like passing out. °· You have trouble breathing, with or without belly pain. °· You have a very bad headache and belly pain. °· You have fluid leaking from your vagina and belly pain. °· You keep having watery poop (diarrhea). °· Your belly pain does not go away after resting, or the pain gets worse. °This information is not intended to replace advice given to you by your health care provider. Make sure you discuss any questions you have with your health care provider. °Document Released: 12/31/2008 Document Revised: 08/21/2015 Document Reviewed: 08/11/2012 °Elsevier Interactive Patient Education © 2017 Elsevier Inc. ° °

## 2016-02-20 ENCOUNTER — Other Ambulatory Visit: Payer: Self-pay

## 2016-02-21 ENCOUNTER — Telehealth (HOSPITAL_COMMUNITY): Payer: Self-pay | Admitting: *Deleted

## 2016-02-21 ENCOUNTER — Encounter (HOSPITAL_COMMUNITY): Payer: Self-pay | Admitting: *Deleted

## 2016-02-21 NOTE — Telephone Encounter (Signed)
Preadmission screen  

## 2016-02-24 ENCOUNTER — Inpatient Hospital Stay (HOSPITAL_COMMUNITY): Payer: No Typology Code available for payment source | Admitting: Anesthesiology

## 2016-02-24 ENCOUNTER — Encounter (HOSPITAL_COMMUNITY): Payer: Self-pay

## 2016-02-24 ENCOUNTER — Inpatient Hospital Stay (HOSPITAL_COMMUNITY)
Admission: AD | Admit: 2016-02-24 | Discharge: 2016-02-27 | DRG: 775 | Disposition: A | Discharge status: Home / Self Care | Payer: No Typology Code available for payment source | Source: Ambulatory Visit | Attending: Obstetrics & Gynecology | Admitting: Obstetrics & Gynecology

## 2016-02-24 DIAGNOSIS — D62 Acute posthemorrhagic anemia: Secondary | ICD-10-CM | POA: Diagnosis not present

## 2016-02-24 DIAGNOSIS — O9081 Anemia of the puerperium: Secondary | ICD-10-CM | POA: Diagnosis not present

## 2016-02-24 DIAGNOSIS — O34219 Maternal care for unspecified type scar from previous cesarean delivery: Secondary | ICD-10-CM

## 2016-02-24 DIAGNOSIS — D509 Iron deficiency anemia, unspecified: Secondary | ICD-10-CM | POA: Diagnosis present

## 2016-02-24 DIAGNOSIS — Z8249 Family history of ischemic heart disease and other diseases of the circulatory system: Secondary | ICD-10-CM | POA: Diagnosis not present

## 2016-02-24 DIAGNOSIS — Z3493 Encounter for supervision of normal pregnancy, unspecified, third trimester: Secondary | ICD-10-CM | POA: Diagnosis present

## 2016-02-24 DIAGNOSIS — Z3A41 41 weeks gestation of pregnancy: Secondary | ICD-10-CM | POA: Diagnosis not present

## 2016-02-24 DIAGNOSIS — R109 Unspecified abdominal pain: Secondary | ICD-10-CM

## 2016-02-24 DIAGNOSIS — O26899 Other specified pregnancy related conditions, unspecified trimester: Secondary | ICD-10-CM

## 2016-02-24 DIAGNOSIS — O34211 Maternal care for low transverse scar from previous cesarean delivery: Secondary | ICD-10-CM | POA: Diagnosis present

## 2016-02-24 LAB — CBC
HCT: 35.6 % — ABNORMAL LOW (ref 36.0–46.0)
Hemoglobin: 12 g/dL (ref 12.0–15.0)
MCH: 28.4 pg (ref 26.0–34.0)
MCHC: 33.7 g/dL (ref 30.0–36.0)
MCV: 84.4 fL (ref 78.0–100.0)
Platelets: 203 10*3/uL (ref 150–400)
RBC: 4.22 MIL/uL (ref 3.87–5.11)
RDW: 15.8 % — ABNORMAL HIGH (ref 11.5–15.5)
WBC: 8.9 10*3/uL (ref 4.0–10.5)

## 2016-02-24 MED ORDER — DIPHENHYDRAMINE HCL 50 MG/ML IJ SOLN: 12.5000 mg | INTRAMUSCULAR | Status: DC | PRN

## 2016-02-24 MED ORDER — OXYTOCIN BOLUS FROM INFUSION
500.0000 mL | Freq: Once | INTRAVENOUS | Status: AC
  Administered 2016-02-25: 500 mL via INTRAVENOUS

## 2016-02-24 MED ORDER — LACTATED RINGERS IV SOLN: 500.0000 mL | Freq: Once | INTRAVENOUS | Status: DC

## 2016-02-24 MED ORDER — ACETAMINOPHEN 325 MG PO TABS: 650.0000 mg | ORAL_TABLET | ORAL | Status: DC | PRN

## 2016-02-24 MED ORDER — LIDOCAINE HCL (PF) 1 % IJ SOLN
30.0000 mL | INTRAMUSCULAR | Status: DC | PRN
  Filled 2016-02-24: qty 30

## 2016-02-24 MED ORDER — LACTATED RINGERS IV SOLN
INTRAVENOUS | Status: DC
  Administered 2016-02-24: 23:00:00 via INTRAVENOUS

## 2016-02-24 MED ORDER — OXYCODONE-ACETAMINOPHEN 5-325 MG PO TABS: 1.0000 | ORAL_TABLET | ORAL | Status: DC | PRN

## 2016-02-24 MED ORDER — PHENYLEPHRINE 40 MCG/ML (10ML) SYRINGE FOR IV PUSH (FOR BLOOD PRESSURE SUPPORT)
80.0000 ug | PREFILLED_SYRINGE | INTRAVENOUS | Status: DC | PRN
  Filled 2016-02-24: qty 5
  Filled 2016-02-24: qty 10

## 2016-02-24 MED ORDER — ONDANSETRON HCL 4 MG/2ML IJ SOLN
4.0000 mg | Freq: Four times a day (QID) | INTRAMUSCULAR | Status: DC | PRN
  Administered 2016-02-25: 4 mg via INTRAVENOUS
  Filled 2016-02-24: qty 2

## 2016-02-24 MED ORDER — OXYTOCIN 40 UNITS IN LACTATED RINGERS INFUSION - SIMPLE MED
2.5000 [IU]/h | INTRAVENOUS | Status: DC
  Filled 2016-02-24: qty 1000

## 2016-02-24 MED ORDER — FENTANYL 2.5 MCG/ML BUPIVACAINE 1/10 % EPIDURAL INFUSION (WH - ANES)
14.0000 mL/h | INTRAMUSCULAR | Status: DC | PRN
  Administered 2016-02-24: 14 mL/h via EPIDURAL
  Filled 2016-02-24: qty 100

## 2016-02-24 MED ORDER — SOD CITRATE-CITRIC ACID 500-334 MG/5ML PO SOLN: 30.0000 mL | ORAL | Status: DC | PRN

## 2016-02-24 MED ORDER — FLEET ENEMA 7-19 GM/118ML RE ENEM: 1.0000 | ENEMA | RECTAL | Status: DC | PRN

## 2016-02-24 MED ORDER — EPHEDRINE 5 MG/ML INJ
10.0000 mg | INTRAVENOUS | Status: DC | PRN
  Filled 2016-02-24: qty 4

## 2016-02-24 MED ORDER — LACTATED RINGERS IV SOLN: 500.0000 mL | INTRAVENOUS | Status: DC | PRN

## 2016-02-24 MED ORDER — LIDOCAINE HCL (PF) 1 % IJ SOLN
INTRAMUSCULAR | Status: DC | PRN
  Administered 2016-02-24 (×2): 5 mL

## 2016-02-24 MED ORDER — OXYCODONE-ACETAMINOPHEN 5-325 MG PO TABS: 2.0000 | ORAL_TABLET | ORAL | Status: DC | PRN

## 2016-02-24 MED ORDER — PHENYLEPHRINE 40 MCG/ML (10ML) SYRINGE FOR IV PUSH (FOR BLOOD PRESSURE SUPPORT)
80.0000 ug | PREFILLED_SYRINGE | INTRAVENOUS | Status: DC | PRN
  Filled 2016-02-24: qty 5

## 2016-02-24 NOTE — MAU Note (Signed)
PT  SAYS [facility]   ARE  STRONG  . FIRST BABY WAS  C/S-  THIS  BABY  - VAG.  WAS 4  CM  TODAY   GBS- NEG

## 2016-02-24 NOTE — H&P (Signed)
  OB ADMISSION/ HISTORY & PHYSICAL:  Admission Date: 02/24/2016  9:57 PM  Admit Diagnosis: 41 weeks / labor inset  Courtney Colon is a 21 y.o. female presenting for onset of labor.  Prenatal History: G2P1001   EDC : 02/17/2016, by Last Menstrual Period  [facility]  Primary Ob Provider: Seymour BarsLavoie Prenatal course complicated by previous CS - arrest descent in 2nd stage / late [facility]  Prenatal Labs: ABO, Rh: O/Positive/-- (11/08 0000) Antibody: Negative (11/08 0000) Rubella: Immune (11/08 0000)  RPR: Nonreactive (11/08 0000)  HBsAg: Negative (11/08 0000)  HIV: Non-reactive (11/08 0000)  GTT: NL GBS: Negative (01/02 0000)   Medical / Surgical History :  Past medical history:  Past Medical History:  Diagnosis Date  . Hx of varicella   . Medical history non-contributory      Past surgical history:  Past Surgical History:  Procedure Laterality Date  . CESAREAN SECTION N/A 01/11/2015   Procedure: CESAREAN SECTION;  Surgeon: Philip AspenSidney Callahan, DO;  Location: [facility];  Service: Obstetrics;  Laterality: N/A;  . MOUTH SURGERY  2011  . WISDOM TOOTH EXTRACTION      Family History:  Family History  Problem Relation Age of Onset  . Hypertension Mother      Social History:  reports that she has never smoked. She has never used smokeless tobacco. She reports that she does not drink alcohol or use drugs.   Allergies: Patient has no known allergies.    Current Medications at time of admission:  Prior to Admission medications   Medication Sig Start Date End Date Taking? Authorizing Provider  ferrous sulfate 325 (65 FE) MG tablet Take 325 mg by mouth daily with breakfast.    Historical Provider, MD  ondansetron (ZOFRAN ODT) 4 MG disintegrating tablet Take 1 tablet (4 mg total) by mouth every 4 (four) hours as needed for nausea or vomiting. 02/06/16   Neta Mendsaniela C Paul, CNM  Prenatal Vit-Fe Fumarate-FA (PRENATAL MULTIVITAMIN) TABS tablet Take 1 tablet by  mouth daily at 12 noon.    Historical Provider, MD   Review of Systems: Active FM onset of ctx @ 5pm currently every 2-423minutes No LOF bloody show present  Physical Exam:  VS: Last menstrual period 05/13/2015, unknown if currently breastfeeding.  General: alert and oriented, appears uncomfortable Heart: RRR Lungs: Clear lung fields Abdomen: Gravid, soft and non-tender, non-distended / uterus: gravid Extremities: no edema  Genitalia / VE: Dilation: 4 Effacement (%): 100 Station: -2 Exam by:: Elie ConferK. Weiss RN  FHR: baseline rate 140 / variability moderate / accelerations + / no decelerations TOCO: Q2-3  Assessment: [redacted] weeks gestation active stage of labor FHR category 1   Plan:  Admit for TOLAC Epidural for pain management  Dr Seymour BarsLavoie notified of admission / plan of care   [name], [name] CNM, MSN, [facility] 02/24/2016, 11:14 PM

## 2016-02-24 NOTE — Anesthesia Preprocedure Evaluation (Signed)

## 2016-02-24 NOTE — Progress Notes (Signed)
Notified of pt arrival in MAU and exam. Ok to admit to labor and delivery 

## 2016-02-24 NOTE — Anesthesia Procedure Notes (Signed)
Epidural Patient location during procedure: OB  Staffing Anesthesiologist: Senya Hinzman Performed: anesthesiologist   Preanesthetic Checklist Completed: patient identified, site marked, surgical consent, pre-op evaluation, timeout performed, IV checked, risks and benefits discussed and monitors and equipment checked  Epidural Patient position: sitting Prep: DuraPrep Patient monitoring: heart rate, continuous pulse ox and blood pressure Approach: right paramedian Location: L3-L4 Injection technique: LOR saline  Needle:  Needle type: Tuohy  Needle gauge: 17 G Needle length: 9 cm and 9 Needle insertion depth: 5 cm Catheter type: closed end flexible Catheter size: 20 Guage Catheter at skin depth: 10 cm Test dose: negative  Assessment Events: blood not aspirated, injection not painful, no injection resistance, negative IV test and no paresthesia  Additional Notes Patient identified. Risks/Benefits/Options discussed with patient including but not limited to bleeding, infection, nerve damage, paralysis, failed block, incomplete pain control, headache, blood pressure changes, nausea, vomiting, reactions to medication both or allergic, itching and postpartum back pain. Confirmed with bedside nurse the patient's most recent platelet count. Confirmed with patient that they are not currently taking any anticoagulation, have any bleeding history or any family history of bleeding disorders. Patient expressed understanding and wished to proceed. All questions were answered. Sterile technique was used throughout the entire procedure. Please see nursing notes for vital signs. Test dose was given through epidural needle and negative prior to continuing to dose epidural or start infusion. Warning signs of high block given to the patient including shortness of breath, tingling/numbness in hands, complete motor block, or any concerning symptoms with instructions to call for help. Patient was given  instructions on fall risk and not to get out of bed. All questions and concerns addressed with instructions to call with any issues.     

## 2016-02-25 ENCOUNTER — Encounter (HOSPITAL_COMMUNITY): Payer: Self-pay

## 2016-02-25 DIAGNOSIS — O34219 Maternal care for unspecified type scar from previous cesarean delivery: Secondary | ICD-10-CM | POA: Diagnosis not present

## 2016-02-25 LAB — TYPE AND SCREEN
ABO/RH(D): O POS
ANTIBODY SCREEN: NEGATIVE

## 2016-02-25 LAB — RPR: RPR Ser Ql: NONREACTIVE

## 2016-02-25 MED ORDER — IBUPROFEN 600 MG PO TABS
600.0000 mg | ORAL_TABLET | Freq: Four times a day (QID) | ORAL | Status: DC
  Administered 2016-02-25 – 2016-02-27 (×9): 600 mg via ORAL
  Filled 2016-02-25 (×9): qty 1

## 2016-02-25 MED ORDER — ACETAMINOPHEN 325 MG PO TABS
650.0000 mg | ORAL_TABLET | ORAL | Status: DC | PRN
  Administered 2016-02-27: 650 mg via ORAL
  Filled 2016-02-25: qty 2

## 2016-02-25 MED ORDER — WITCH HAZEL-GLYCERIN EX PADS: 1.0000 | MEDICATED_PAD | CUTANEOUS | Status: DC | PRN

## 2016-02-25 MED ORDER — SENNOSIDES-DOCUSATE SODIUM 8.6-50 MG PO TABS
2.0000 | ORAL_TABLET | ORAL | Status: DC
  Administered 2016-02-25 – 2016-02-26 (×2): 2 via ORAL
  Filled 2016-02-25 (×2): qty 2

## 2016-02-25 MED ORDER — DIPHENHYDRAMINE HCL 25 MG PO CAPS
25.0000 mg | ORAL_CAPSULE | Freq: Four times a day (QID) | ORAL | Status: DC | PRN
  Filled 2016-02-25: qty 1

## 2016-02-25 MED ORDER — BENZOCAINE-MENTHOL 20-0.5 % EX AERO: 1.0000 "application " | INHALATION_SPRAY | CUTANEOUS | Status: DC | PRN

## 2016-02-25 MED ORDER — SIMETHICONE 80 MG PO CHEW
80.0000 mg | CHEWABLE_TABLET | ORAL | Status: DC | PRN
  Administered 2016-02-27: 80 mg via ORAL
  Filled 2016-02-25: qty 1

## 2016-02-25 MED ORDER — DIBUCAINE 1 % RE OINT: 1.0000 "application " | TOPICAL_OINTMENT | RECTAL | Status: DC | PRN

## 2016-02-25 MED ORDER — COCONUT OIL OIL
1.0000 | TOPICAL_OIL | Status: DC | PRN
  Filled 2016-02-25: qty 120

## 2016-02-25 MED ORDER — OXYCODONE HCL 5 MG PO TABS
5.0000 mg | ORAL_TABLET | ORAL | Status: DC | PRN
  Administered 2016-02-25 (×2): 5 mg via ORAL
  Filled 2016-02-25 (×2): qty 1

## 2016-02-25 NOTE — Anesthesia Postprocedure Evaluation (Signed)
Anesthesia Post Note  Patient: Courtney Colon  Procedure(s) Performed: * No procedures listed *  Patient location during evaluation: Mother Baby Anesthesia Type: Epidural Level of consciousness: awake Pain management: pain level controlled Vital Signs Assessment: post-procedure vital signs reviewed and stable Respiratory status: spontaneous breathing Cardiovascular status: stable Postop Assessment: no headache, no backache, epidural receding and patient able to bend at knees Anesthetic complications: no        Last Vitals:  Vitals:   02/25/16 0331 02/25/16 0430  BP: 129/66 139/90  Pulse: (!) 125 95  Resp: 14 20  Temp: 37.1 C 36.4 C    Last Pain:  Vitals:   02/25/16 0430  TempSrc: Axillary  PainSc:    Pain Goal: Patients Stated Pain Goal: 4 (02/24/16 2253)               [name],[name]

## 2016-02-25 NOTE — Progress Notes (Signed)
Contacted CNM Marlinda Mikeanya Bailey to obtain PRN order for benedryl for patient's complaint of itching.

## 2016-02-25 NOTE — Lactation Note (Signed)
This note was copied from a baby's chart. Lactation Consultation Note  Patient Name: Courtney Colon Today's Date: 02/25/2016 Reason for consult: Initial assessment Baby at 16 hr of life. Upon entry baby was sleeping in visitor's arms. Experienced bf mom reports baby is latching well. She denies breast or nipple pain, voiced no concerns. Mom desires to offer breast and formula bottles. Dad asked if they could have more bottles and some to take home. Discussed baby friendly. Mom stated she has WIC and a DEBP at home. Offered to help latch baby and mom declined because "baby is sleeping". She will call for lactation at the next feeding. Discussed baby behavior, feeding frequency, baby belly size, voids, wt loss, breast changes, and nipple care. Mom stated she can manually express and has a spoon but would prefer to offer a bottle of formula. Given lactation handouts. Aware of OP services and support group.    Maternal Data Has patient been taught Hand Expression?: Yes Does the patient have breastfeeding experience prior to this delivery?: Yes  Feeding Feeding Type: Breast Fed Length of feed: 20 min  LATCH Score/Interventions                      Lactation Tools Discussed/Used WIC Program: Yes   Consult Status Consult Status: Follow-up Date: 02/26/16 Follow-up type: In-patient    Courtney Colon 02/25/2016, 6:29 PM

## 2016-02-25 NOTE — Progress Notes (Signed)
Patient ID: Courtney Colon, female   DOB: 03/24/1995, 21 y.o.   MRN: [id number] INTERVAL NOTE: S/P VBAC with 2nd Degree Perineal Laceration  S:   Sitting in bed, BFing, min cramping, (+) voids, small bleed, denies HA/NV/dizziness  O:   VSS, AAO x 3, NAD  U-1  Scant lochia  Moderate edema of labia and perineum  A / P:   PPD #0  Stable post partum  Routine PP orders  Courtney Colon, Minami Arriaga, M, MSN, CNM 02/25/2016, 9:15 AM

## 2016-02-25 NOTE — Progress Notes (Signed)
S:  Pressure with ctx / mild nausea       Drowsy and itching with epidural meds   O:  VS: Blood pressure (!) 144/96, pulse 93, resp. rate 20, height 5' (1.524 m), weight 64.4 kg (142 lb), last menstrual period 05/13/2015, SpO2 98 %, unknown if currently breastfeeding.        FHR : baseline 145 / variability moderate / accelerations absent / variable decelerations        Toco: contractions every 3-5 minutes         Cervix : complete vtx + 1        A: second stage of labor     FHR category 2  P: IV mainline bolus for hydration - ctx decreased with epidural   [name], Avis Tirone CNM, MSN, FACNM 02/25/2016, [id number]

## 2016-02-25 NOTE — Progress Notes (Signed)
S:  pressure with ctx - epidural effective for ctx pain  O:  VS: Blood pressure (!) 144/96, pulse 93, resp. rate 20, height 5' (1.524 m), weight 64.4 kg (142 lb), last menstrual period 05/13/2015, SpO2 98 %, unknown if currently breastfeeding.        FHR : baseline 135 / variability moderate / accelerations10x10 /occasional variable decelerations        Toco: contractions every 2 minutes        Cervix : 10cm 100% vtx +1        Membranes: AROM - clear  A: active labor - awaiting urge to push for active second stage      FHR category 2  P: reposition to rotate fetus for descent       expectant management       attempt VBAC     [name], TANYA CNM, MSN, [facility] 02/25/2016, 12:32 AM

## 2016-02-26 ENCOUNTER — Inpatient Hospital Stay (HOSPITAL_COMMUNITY): Admission: RE | Admit: 2016-02-26 | Payer: No Typology Code available for payment source | Source: Ambulatory Visit

## 2016-02-26 ENCOUNTER — Inpatient Hospital Stay (HOSPITAL_COMMUNITY): Payer: No Typology Code available for payment source

## 2016-02-26 DIAGNOSIS — D62 Acute posthemorrhagic anemia: Secondary | ICD-10-CM | POA: Diagnosis not present

## 2016-02-26 LAB — CBC
HCT: 27.8 % — ABNORMAL LOW (ref 36.0–46.0)
Hemoglobin: 9.5 g/dL — ABNORMAL LOW (ref 12.0–15.0)
MCH: 29.1 pg (ref 26.0–34.0)
MCHC: 34.2 g/dL (ref 30.0–36.0)
MCV: 85.3 fL (ref 78.0–100.0)
Platelets: 157 10*3/uL (ref 150–400)
RBC: 3.26 MIL/uL — ABNORMAL LOW (ref 3.87–5.11)
RDW: 15.9 % — AB (ref 11.5–15.5)
WBC: 14.1 10*3/uL — ABNORMAL HIGH (ref 4.0–10.5)

## 2016-02-26 MED ORDER — MAGNESIUM OXIDE 400 (241.3 MG) MG PO TABS
400.0000 mg | ORAL_TABLET | Freq: Every day | ORAL | Status: DC
  Administered 2016-02-26 – 2016-02-27 (×2): 400 mg via ORAL
  Filled 2016-02-26 (×2): qty 1

## 2016-02-26 MED ORDER — POLYSACCHARIDE IRON COMPLEX 150 MG PO CAPS
150.0000 mg | ORAL_CAPSULE | Freq: Every day | ORAL | Status: DC
  Administered 2016-02-26 – 2016-02-27 (×2): 150 mg via ORAL
  Filled 2016-02-26 (×2): qty 1

## 2016-02-26 NOTE — Progress Notes (Signed)
PPD # 1 SVD Information for the patient's newborn:  Courtney Colon, Courtney Colon [[id number]][[id number]]  female    breast feeding  / Circumcision undecided   S:  Reports feeling tired but well, happy with vaginal birth.             Tolerating po/ No nausea or vomiting             Bleeding is light             Pain controlled with ibuprofen (OTC)             Up ad lib / ambulatory / voiding without difficulties        O:  A & O x 3, in no apparent distress              VS:  Vitals:   02/25/16 0430 02/25/16 0940 02/25/16 1717 02/26/16 0546  BP: 139/90 118/71 123/74 111/67  Pulse: 95 86 82 86  Resp: 20 18 18 18   Temp: 97.5 F (36.4 C) 97.9 F (36.6 C) 98.1 F (36.7 C) 98.3 F (36.8 C)  TempSrc: Axillary Oral Oral Oral  SpO2:      Weight:      Height:        LABS:  Recent Labs  02/24/16 2235 02/26/16 0517  WBC 8.9 14.1*  HGB 12.0 9.5*  HCT 35.6* 27.8*  PLT 203 157    Blood type: --/--/O POS (01/29 2235)  Rubella: Immune (11/08 0000)   I&O: I/O last 3 completed shifts: In: -  Out: 200 [Blood:200]          No intake/output data recorded.  Lungs: Clear and unlabored  Heart: regular rate and rhythm / no murmurs  Abdomen: soft, non-tender, non-distended             Fundus: firm, non-tender, U+2, to the Right  Perineum: mild edema, 2nd deg repair intact  Lochia: small  Extremities: 1 edema, no calf pain or tenderness    A/P: PPD # 1 20 y.o., [id number]   Principal Problem:   Postpartum care following vbac (1/30) Active Problems:   VBAC (vaginal birth after Cesarean)   Acute blood loss anemia   2nd Degree perineal tear  Doing well - stable status  Routine post partum orders  Info on circumcision provided  Start oral Fe and Mag Ox supplement  Anticipate discharge tomorrow    Neta Mendsaniela C Paul, MSN, CNM 02/26/2016, 8:46 AM

## 2016-02-26 NOTE — Lactation Note (Signed)
This note was copied from a baby's chart. Lactation Consultation Note  Patient Name: Courtney Colon ZOXWR'UToday's Date: 02/26/2016 Reason for consult: Follow-up assessment   Follow up with mom of 40 hour old infant. Mom reports BF is going well. She reports her left side is feeling fuller. She reports nipple tenderness with initial latch, enc mom to express and put EBM to nipples post BF.   Infant with 10 BF for 10-60 minutes, 2 formula feeds of 9-10 cc, 2 voids and 2 stools in 24 hours preceding assessment. Infant weight 7 lb 5.3 oz with 6% weight loss since birth. LATCH Scores 9.   Infant being held by FOB. Infant is showing feeding cues, mom reports he just finished feeding. Offered to assist mom with feeding, she declined. Mom reports she has no questions/concerns at this time.    Maternal Data Formula Feeding for Exclusion: Yes Reason for exclusion: Mother's choice to formula and breast feed on admission  Feeding Feeding Type: Breast Fed Length of feed: 10 min  LATCH Score/Interventions                      Lactation Tools Discussed/Used     Consult Status Consult Status: Follow-up Date: 02/27/16 Follow-up type: In-patient    Silas FloodSharon S Nikki Glanzer 02/26/2016, 6:21 PM

## 2016-02-27 MED ORDER — TETANUS-DIPHTH-ACELL PERTUSSIS 5-2.5-18.5 LF-MCG/0.5 IM SUSP: 0.5000 mL | Freq: Once | INTRAMUSCULAR | Status: DC

## 2016-02-27 MED ORDER — MAGNESIUM OXIDE 400 (241.3 MG) MG PO TABS: 400.0000 mg | ORAL_TABLET | Freq: Every day | ORAL | 0 refills | Status: DC

## 2016-02-27 MED ORDER — OXYCODONE HCL 5 MG PO TABS: 5.0000 mg | ORAL_TABLET | ORAL | 0 refills | Status: DC | PRN

## 2016-02-27 MED ORDER — POLYSACCHARIDE IRON COMPLEX 150 MG PO CAPS: 150.0000 mg | ORAL_CAPSULE | Freq: Every day | ORAL | 0 refills | Status: DC

## 2016-02-27 MED ORDER — IBUPROFEN 600 MG PO TABS: 600.0000 mg | ORAL_TABLET | Freq: Four times a day (QID) | ORAL | 0 refills | Status: DC

## 2016-02-27 NOTE — Progress Notes (Signed)
Post Partum Day #2           Information for the patient's newborn:  Hannah BeatKong, Boy Joette [[id number]][[id number]]  female  circumcision undecided Feeding: breast  Subjective: No HA, SOB, CP or dizziness, breast symptoms- none. Pain in perineum, managed with ibuprofen.  Encouraged to do sitz baths. Normal vaginal bleeding, no clots.    Ambulating and urinating without difficulty, denies passing any gas.  Objective:  VS:  Vitals:   02/25/16 1717 02/26/16 0546 02/26/16 1835 02/27/16 0512  BP: 123/74 111/67 118/74 105/61  Pulse: 82 86 76 77  Resp: 18 18 18 18   Temp: 98.1 F (36.7 C) 98.3 F (36.8 C) 98.1 F (36.7 C) 98.2 F (36.8 C)  TempSrc: Oral Oral Oral   SpO2:      Weight:      Height:        No intake or output data in the 24 hours ending 02/27/16 0856     Recent Labs  02/24/16 2235 02/26/16 0517  WBC 8.9 14.1*  HGB 12.0 9.5*  HCT 35.6* 27.8*  PLT 203 157    Blood type: --/--/O POS (01/29 2235) Rubella: Immune (11/08 0000)    Physical Exam:  General: alert, cooperative and no distress  Abdomen: soft, nontender. BS x4 Uterine Fundus: firm Lochia: appropriate Perineum: repair 2nd, edema no DVT Evaluation: No evidence of DVT seen on physical exam. Negative Homan's sign. No cords or calf tenderness. No significant calf/ankle edema.    Assessment/Plan: PPD # 2 / 21 y.o., [id number] S/P:spontaneous vaginal  Principal Problem:  Postpartum care following vbac (1/30)  normal postpartum exam  able to resume normal activities  Continue current postpartum care Active Problems: Acute blood loss anemia  Fe and Mag oxide supplements              DC home today w/ instructions  [facility] and PRN   LOS: 3 days   Apolonio Schneidersinthya Bowmaker-Kareen, BSN, SNM 02/27/2016, 8:56 AM

## 2016-02-27 NOTE — Progress Notes (Signed)
  CLINICAL SOCIAL WORK MATERNAL/CHILD NOTE  Patient Details  Name: Courtney Colon MRN: [id number] Date of Birth: 02/25/2017  Date:  02/27/2016  Clinical Social Worker Initiating Note:  Terri Piedra, [place] Date/ Time Initiated:  02/27/16/0900     Child's Name:  Courtney Colon   Legal Guardian:  Other (Comment) Courtney Colon and Courtney Colon)   Need for Interpreter:  None   Date of Referral:  02/26/16     Reason for Referral:  Late or No Prenatal Care    Referral Source:  [facility]   Address:  [address] [place], [place] [address]  Phone number:  [phone]   Household Members:  Minor Children, Significant Other (Parents have a one year old daughter/Courtney Colon at home.)   [employer] (not living in the home):  Immediate Family, Extended Family, Friends   Medical illustrator Supports: None   Employment:     Type of Work:     Education:      Pensions consultant:  [employer], Multimedia programmer   Other Resources:      Cultural/Religious Considerations Which May Impact Care: None stated.    Strengths:  Ability to meet basic needs , Pediatrician chosen , Home prepared for child  Conservation officer, nature)   Risk Factors/Current Problems:  None   Cognitive State:  Alert , Able to Concentrate , Insightful , Linear Thinking    Mood/Affect:  Calm , Comfortable , Euthymic    CSW Assessment: CSW met with parents in MOB's first floor room/106 to offer support and complete assessment due to [facility] at 22 weeks.  FOB was asleep next to MOB and MOB was holding baby.  MOB was pleasant and welcoming, stating this was a good time to talk with her. MOB reports that she and baby are doing well.  This is the couple's second child and have a 22 year old daughter at home.  MOB reports feeling that she is well supported at home.  Their daughter is currently being cared for by FOB's parents, who live locally, while parents are in the hospital. CSW provided education regarding PMADs and stressed  the importance of talking with a medical professional if concerns are noted at any time.  MOB stated understanding and denies any hx with first delivery.   CSW inquired about PNC.  MOB states she had "problems with insurance" and that her first visit was 29 weeks due to this.  CSW informed her of hospital drug screen policy and MOB stated no concerns.  Baby's UDS is negative.  CSW will monitor CDS result. CSW informed pediatrician of no barriers to discharge.  CSW Plan/Description:  No Further Intervention Required/No Barriers to Discharge, Patient/Family Education     Alphonzo Cruise, [place] 02/27/2016, 3:40 PM

## 2016-02-27 NOTE — Lactation Note (Signed)
This note was copied from a baby's chart. Lactation Consultation Note: Mother states she is not having any problems with latching infant. She reports hearing swallows. Mother is able to hand express and to pump colostrum with hand pump.  Advised mother to continue to cue base feed infant . Mother advised to continue with skin to skin and allow for cluster feeding.  Reviewed treatment plan to prevent engorgement. Mother is active with WIC . Suggested that she phone WIC on discharge. Mother receptive to all teaching. Mother is aware of available Lactation Services.   Patient Name: Boy Alver Sorrownita Birchard RUEAV'WToday's Date: 02/27/2016 Reason for consult: Follow-up assessment   Maternal Data    Feeding Feeding Type: Breast Fed Length of feed: 10 min  LATCH Score/Interventions                      Lactation Tools Discussed/Used     Consult Status Consult Status: Complete    Michel BickersKendrick, Ande Therrell McCoy 02/27/2016, 9:40 AM

## 2016-02-27 NOTE — Discharge Summary (Signed)
Discharge summary Completed by CNM

## 2016-02-27 NOTE — Discharge Summary (Signed)
Obstetric Discharge Summary Reason for Admission: onset of labor at 41.1 weeks Prenatal Procedures: none Intrapartum Procedures: epidural, TOLAC, spontaneous vaginal delivery Postpartum Procedures: none Complications-Operative and Postpartum: 2nd degree perineal laceration Hemoglobin  Date Value Ref Range Status  02/26/2016 9.5 (L) 12.0 - 15.0 g/dL Final    Comment:    DELTA CHECK NOTED REPEATED TO VERIFY    HCT  Date Value Ref Range Status  02/26/2016 27.8 (L) 36.0 - 46.0 % Final    Physical Exam:  General: alert, cooperative and no distress Lochia: appropriate Uterine Fundus: firm Incision: healing well DVT Evaluation: No evidence of DVT seen on physical exam.  Discharge Diagnoses: Term Pregnancy-delivered and ABL anemia, VBAC  Discharge Information: Date: 02/27/2016 Activity: pelvic rest Diet: routine Medications: PNV, Ibuprofen, Iron, Percocet and magneisum Condition: stable Instructions: refer to practice specific booklet Discharge to: home Follow-up Information    Courtney Colon, CNM. Schedule an appointment as soon as possible for a visit in 6 week(s).   Specialty:  Obstetrics and Gynecology Contact information: [place] [address] [place] [id number] [phone]           Newborn Data: Live born female  Birth Weight: 8 lb 7.1 oz (3830 g) APGAR: 7, 8  Home with mother.  Courtney Colon, Courtney Colon 02/27/2016, 11:24 AM

## 2016-02-27 NOTE — Lactation Note (Addendum)
This note was copied from a baby's chart. Lactation Consultation Note Spoke w/FOB regarding I&O, feeding, BF and supplementing. Mom sleeping soundly. At this moment baby has only 3 voids and 2 stools at 50 hrs old w/7% weight loss. Documented supplementing of formula once.  Gave FOB feeding sheet of supplementing amount after BF according to hours of age. W/very low out put the baby needed to be supplemented. FOB stated the baby has been getting BM from mom pumping, as well as having good pee's and poops. Stress the importance of documenting and reporting to staff of out put. Encouraged when mom awakens to call RN to update feedings. FOB stated mom is supplementing w/Oz. BM.  Spoke w/RN asking to update I&O when mom awakens. RN stated baby has had stool on her shift. Will assess I&O after update.  Patient Name: Courtney Colon ZOXWR'UToday's Date: 02/27/2016 Reason for consult: Follow-up assessment;Infant weight loss   Maternal Data    Feeding    LATCH Score/Interventions                      Lactation Tools Discussed/Used     Consult Status Consult Status: Follow-up Date: 02/27/16 Follow-up type: In-patient    Charyl DancerCARVER, Madalin Hughart G 02/27/2016, 4:27 AM

## 2017-01-26 NOTE — L&D Delivery Note (Signed)
Delivery Note  First Stage: Labor onset: midnight Augmentation : none Analgesia /Anesthesia intrapartum: epidural AROM at 0421  Second Stage: Complete dilation at 0421 Onset of pushing at 0425 FHR second stage category 2 W/ variable decels  Delivery of a viable female at [id number] by CNM in ROA position no nuchal cord Cord double clamped after cessation of pulsation, cut by FOB Cord blood sample collected   Third Stage: [facility] intact with 3 VC @ 0458 Placenta disposition: hospital disposal Uterine tone firm / bleeding small to moderate  no laceration identified - bruising perineum with superficial abrasion at introitus Est. Blood Loss (mL): 200  Complications: none  Moderate bleeding with first two fundal checks - expressed 2 small clots with firm uterus and no further gushes  Mom to postpartum.  Baby to Couplet care / Skin to Skin.  Newborn: Birth Weight: 8 pounds 13 oz Apgar Scores: 1-minute: 9                           5-minute: 10   Feeding planned: breast  Courtney Colon CNM, MSN, [facility] Louis[facility]ACNM 07/27/2017, 5:11 AM

## 2017-04-29 LAB — OB RESULTS CONSOLE ANTIBODY SCREEN: Antibody Screen: NEGATIVE

## 2017-04-29 LAB — OB RESULTS CONSOLE RPR: RPR: NONREACTIVE

## 2017-04-29 LAB — OB RESULTS CONSOLE ABO/RH: RH TYPE: POSITIVE

## 2017-04-29 LAB — OB RESULTS CONSOLE RUBELLA ANTIBODY, IGM: RUBELLA: IMMUNE

## 2017-04-29 LAB — OB RESULTS CONSOLE HIV ANTIBODY (ROUTINE TESTING): HIV: NONREACTIVE

## 2017-04-29 LAB — OB RESULTS CONSOLE HEPATITIS B SURFACE ANTIGEN: HEP B S AG: NEGATIVE

## 2017-04-29 LAB — OB RESULTS CONSOLE GC/CHLAMYDIA
Chlamydia: NEGATIVE
GC PROBE AMP, GENITAL: NEGATIVE

## 2017-06-04 LAB — OB RESULTS CONSOLE GBS: GBS: NEGATIVE

## 2017-06-18 ENCOUNTER — Other Ambulatory Visit: Payer: Self-pay

## 2017-06-18 ENCOUNTER — Emergency Department (HOSPITAL_COMMUNITY)
Admission: EM | Admit: 2017-06-18 | Discharge: 2017-06-18 | Disposition: A | Discharge status: Home / Self Care | Payer: No Typology Code available for payment source | Attending: Emergency Medicine | Admitting: Emergency Medicine

## 2017-06-18 ENCOUNTER — Encounter (HOSPITAL_COMMUNITY): Payer: Self-pay | Admitting: Emergency Medicine

## 2017-06-18 DIAGNOSIS — M545 Low back pain: Secondary | ICD-10-CM | POA: Insufficient documentation

## 2017-06-18 DIAGNOSIS — N859 Noninflammatory disorder of uterus, unspecified: Secondary | ICD-10-CM

## 2017-06-18 DIAGNOSIS — E860 Dehydration: Secondary | ICD-10-CM | POA: Diagnosis not present

## 2017-06-18 DIAGNOSIS — O26893 Other specified pregnancy related conditions, third trimester: Secondary | ICD-10-CM | POA: Insufficient documentation

## 2017-06-18 DIAGNOSIS — Z79899 Other long term (current) drug therapy: Secondary | ICD-10-CM | POA: Insufficient documentation

## 2017-06-18 DIAGNOSIS — N3000 Acute cystitis without hematuria: Secondary | ICD-10-CM | POA: Insufficient documentation

## 2017-06-18 DIAGNOSIS — N858 Other specified noninflammatory disorders of uterus: Secondary | ICD-10-CM

## 2017-06-18 DIAGNOSIS — Z3A33 33 weeks gestation of pregnancy: Secondary | ICD-10-CM | POA: Insufficient documentation

## 2017-06-18 LAB — URINALYSIS, ROUTINE W REFLEX MICROSCOPIC
BILIRUBIN URINE: NEGATIVE
GLUCOSE, UA: NEGATIVE mg/dL
Ketones, ur: 80 mg/dL — AB
Nitrite: NEGATIVE
PROTEIN: 100 mg/dL — AB
Specific Gravity, Urine: 1.015 (ref 1.005–1.030)
pH: 6 (ref 5.0–8.0)

## 2017-06-18 MED ORDER — SODIUM CHLORIDE 0.9 % IV SOLN
1.0000 g | Freq: Once | INTRAVENOUS | Status: AC
  Administered 2017-06-18: 1 g via INTRAVENOUS
  Filled 2017-06-18: qty 10

## 2017-06-18 MED ORDER — SODIUM CHLORIDE 0.9 % IV BOLUS
500.0000 mL | Freq: Once | INTRAVENOUS | Status: AC
  Administered 2017-06-18: 500 mL via INTRAVENOUS

## 2017-06-18 MED ORDER — CEPHALEXIN 500 MG PO CAPS: 500.0000 mg | ORAL_CAPSULE | Freq: Three times a day (TID) | ORAL | 0 refills | Status: DC

## 2017-06-18 MED ORDER — SODIUM CHLORIDE 0.9 % IV BOLUS
1000.0000 mL | Freq: Once | INTRAVENOUS | Status: AC
  Administered 2017-06-18: 1000 mL via INTRAVENOUS

## 2017-06-18 NOTE — ED Provider Notes (Signed)
[facility] EMERGENCY DEPARTMENT Provider Note   CSN: [id number] Arrival date & time: 06/18/17  4098     History   Chief Complaint Chief Complaint  Patient presents with  . Contractions    HPI Courtney Colon is a 22 y.o. female.  HPI Pt is a G3P2A0 currently [redacted] weeks pregnant presenting to the emergency department for concern of contractions as she is having some low back pain and feeling like she needs to have a bowel movement.  She has had dysuria and urinary frequency.  She reports decreased oral intake today.  No vaginal bleeding or loss of fluid.  She continues to feel the baby move around.  She is a G3, P2 A0 with an initial C-section followed by vaginal delivery.  She has had no complications with this pregnancy thus far.  She spoke with her OB earlier who recommended that she drink lots of fluids.  She stated that her symptoms persisted that she came to the ER for further evaluation.  She is currently spending lots of time at the [facility] as her youngest child is currently hospitalized on the pediatric floor with pneumonia.  No upper respiratory symptoms for the patient   Past Medical History:  Diagnosis Date  . Hx of varicella   . Medical history non-contributory     Patient Active Problem List   Diagnosis Date Noted  . Acute blood loss anemia 02/26/2016  . Postpartum care following vbac (1/30) 02/25/2016  . Previous cesarean section complicating pregnancy 02/06/2016    Past Surgical History:  Procedure Laterality Date  . CESAREAN SECTION N/A 01/11/2015   Procedure: CESAREAN SECTION;  Surgeon: Philip Aspen, DO;  Location: [facility];  Service: Obstetrics;  Laterality: N/A;  . MOUTH SURGERY  2011  . WISDOM TOOTH EXTRACTION       OB History    Gravida  3   Para  2   Term  2   Preterm      AB      Living  2     SAB      TAB      Ectopic      Multiple  0   Live Births  2            Home Medications    Prior to  Admission medications   Medication Sig Start Date End Date Taking? Authorizing Provider  ibuprofen (ADVIL,MOTRIN) 600 MG tablet Take 1 tablet (600 mg total) by mouth every 6 (six) hours. 02/27/16   Marlinda Mike, CNM  iron polysaccharides (NIFEREX) 150 MG capsule Take 1 capsule (150 mg total) by mouth daily. 02/27/16   Marlinda Mike, CNM  magnesium oxide (MAG-OX) 400 (241.3 Mg) MG tablet Take 1 tablet (400 mg total) by mouth daily. 02/27/16   Marlinda Mike, CNM  oxyCODONE (OXY IR/ROXICODONE) 5 MG immediate release tablet Take 1 tablet (5 mg total) by mouth every 4 (four) hours as needed (pain scale 4-7). 02/27/16   Marlinda Mike, CNM  Prenatal Vit-Fe Fumarate-FA (PRENATAL MULTIVITAMIN) TABS tablet Take 1 tablet by mouth daily at 12 noon.    [provider]    Family History Family History  Problem Relation Age of Onset  . Hypertension Mother     Social History Social History   Tobacco Use  . Smoking status: Never Smoker  . Smokeless tobacco: Never Used  Substance Use Topics  . Alcohol use: No  . Drug use: No     Allergies  Patient has no known allergies.   Review of Systems Review of Systems  All other systems reviewed and are negative.    Physical Exam Updated Vital Signs BP 107/70   Pulse (!) 105   Resp 19   SpO2 99%   Physical Exam  Constitutional: She is oriented to person, place, and time. She appears well-developed and well-nourished. No distress.  HENT:  Head: Normocephalic and atraumatic.  Eyes: EOM are normal.  Neck: Normal range of motion.  Cardiovascular: Normal rate and regular rhythm.  Pulmonary/Chest: Effort normal and breath sounds normal.  Abdominal: Soft. She exhibits no distension. There is no tenderness. There is no guarding.  Gravid uterus consistent with dates  Musculoskeletal: Normal range of motion.  Neurological: She is alert and oriented to person, place, and time.  Skin: Skin is warm and dry.  Psychiatric: She has a normal mood and  affect. Judgment normal.  Nursing note and vitals reviewed.    ED Treatments / Results  Labs (all labs ordered are listed, but only abnormal results are displayed) Labs Reviewed  URINALYSIS, ROUTINE W REFLEX MICROSCOPIC - Abnormal; Notable for the following components:      Result Value   APPearance CLOUDY (*)    Hgb urine dipstick SMALL (*)    Ketones, ur 80 (*)    Protein, ur 100 (*)    Leukocytes, UA MODERATE (*)    RBC / HPF >50 (*)    WBC, UA >50 (*)    Bacteria, UA RARE (*)    All other components within normal limits  URINE CULTURE    EKG None  Radiology No results found.  Procedures Procedures (including critical care time)  Medications Ordered in ED Medications  cefTRIAXone (ROCEPHIN) 1 g in sodium chloride 0.9 % 100 mL IVPB (has no administration in time range)  sodium chloride 0.9 % bolus 500 mL (has no administration in time range)  sodium chloride 0.9 % bolus 1,000 mL (0 mLs Intravenous Stopped 06/18/17 0436)     Initial Impression / Assessment and Plan / ED Course  I have reviewed the triage vital signs and the nursing notes.  Pertinent labs & imaging results that were available during my care of the patient were reviewed by me and considered in my medical decision making (see chart for details).    Fetal heart rate is in the 140s and 50s on the monitor with good variability.  No organized contractions noted on tocometry but the patient does have evidence of uterine irritability.  Likely dehydrated.  Feels much better after IV fluids.  Patient had a cervical check performed by rapid response of the nurse who reported 1 to 2 cm and thick cervix  6:01 AM Case discussed with Dr Augusto Garbe by rapid response RN who clears pt from [facility] standpoint. IV rocephin now. Urine culture. Additional IV fluids now  .   Final Clinical Impressions(s) / ED Diagnoses   Final diagnoses:  None    ED Discharge Orders    None       Azalia Bilis, MD 06/18/17 [phone]

## 2017-06-18 NOTE — Progress Notes (Signed)
Patient states she is feeling better; waiting for lab results at this time

## 2017-06-18 NOTE — Progress Notes (Signed)
Courtney Colon, CNM notified of U/A results and EFM and patient feeling better at this time; patient is OB cleared at this time

## 2017-06-18 NOTE — ED Triage Notes (Signed)
Pt presents with c/o contractions starting at 2pm yesterday. Pt states they are 5-71minutes apart. G3P2, denies loss of fluids/vaginal bleeding.

## 2017-06-18 NOTE — Progress Notes (Signed)
RROB called to patient's beside who presents to [facility] ED with complaints of contractions that started around 2pm; patient is a G3P2 at 34.[redacted] weeks along in her pregnancy at this time; she denies bleeding or leaking of fluid at this time; patient states she has also had some UTI symptoms as well including burning and frequency; she states she has also not had much to drink or eat today nor much sleep as her daughter is also a patient at this time at [facility]; EFM applied and assessing at this time; SVE performed and 1-2/thick/-2 noted; Marlinda Mike, CNM called at this time to notify of patient's arrival and complains; orders given for fluids, and a U/A and continued monitoring at this time

## 2017-06-19 LAB — URINE CULTURE

## 2017-07-15 ENCOUNTER — Encounter (HOSPITAL_COMMUNITY): Payer: Self-pay

## 2017-07-15 ENCOUNTER — Inpatient Hospital Stay (HOSPITAL_COMMUNITY)
Admission: AD | Admit: 2017-07-15 | Discharge: 2017-07-15 | Disposition: A | Discharge status: Home / Self Care | Payer: No Typology Code available for payment source | Source: Ambulatory Visit | Attending: Obstetrics and Gynecology | Admitting: Obstetrics and Gynecology

## 2017-07-15 ENCOUNTER — Inpatient Hospital Stay (HOSPITAL_COMMUNITY): Payer: No Typology Code available for payment source

## 2017-07-15 DIAGNOSIS — W228XXA Striking against or struck by other objects, initial encounter: Secondary | ICD-10-CM | POA: Insufficient documentation

## 2017-07-15 DIAGNOSIS — R1011 Right upper quadrant pain: Secondary | ICD-10-CM | POA: Diagnosis present

## 2017-07-15 DIAGNOSIS — O9A213 Injury, poisoning and certain other consequences of external causes complicating pregnancy, third trimester: Secondary | ICD-10-CM | POA: Diagnosis not present

## 2017-07-15 DIAGNOSIS — Z79899 Other long term (current) drug therapy: Secondary | ICD-10-CM | POA: Diagnosis not present

## 2017-07-15 DIAGNOSIS — Z3A37 37 weeks gestation of pregnancy: Secondary | ICD-10-CM | POA: Diagnosis not present

## 2017-07-15 DIAGNOSIS — O26893 Other specified pregnancy related conditions, third trimester: Secondary | ICD-10-CM | POA: Diagnosis not present

## 2017-07-15 DIAGNOSIS — R1013 Epigastric pain: Secondary | ICD-10-CM | POA: Insufficient documentation

## 2017-07-15 DIAGNOSIS — S3981XA Other specified injuries of abdomen, initial encounter: Secondary | ICD-10-CM | POA: Insufficient documentation

## 2017-07-15 DIAGNOSIS — O1213 Gestational proteinuria, third trimester: Secondary | ICD-10-CM | POA: Insufficient documentation

## 2017-07-15 DIAGNOSIS — R109 Unspecified abdominal pain: Secondary | ICD-10-CM

## 2017-07-15 DIAGNOSIS — S3991XA Unspecified injury of abdomen, initial encounter: Secondary | ICD-10-CM

## 2017-07-15 HISTORY — DX: Anemia, unspecified: D64.9

## 2017-07-15 LAB — URINALYSIS, ROUTINE W REFLEX MICROSCOPIC
BILIRUBIN URINE: NEGATIVE
Glucose, UA: NEGATIVE mg/dL
KETONES UR: NEGATIVE mg/dL
Nitrite: NEGATIVE
PH: 7 (ref 5.0–8.0)
Protein, ur: 100 mg/dL — AB
RBC / HPF: >50 RBC/hpf — ABNORMAL HIGH (ref 0–5)
Specific Gravity, Urine: 1.015 (ref 1.005–1.030)

## 2017-07-15 LAB — CBC
HCT: 32.7 % — ABNORMAL LOW (ref 36.0–46.0)
Hemoglobin: 10.4 g/dL — ABNORMAL LOW (ref 12.0–15.0)
MCH: 26.1 pg (ref 26.0–34.0)
MCHC: 31.8 g/dL (ref 30.0–36.0)
MCV: 82 fL (ref 78.0–100.0)
PLATELETS: 198 10*3/uL (ref 150–400)
RBC: 3.99 MIL/uL (ref 3.87–5.11)
RDW: 14.1 % (ref 11.5–15.5)
WBC: 9.3 10*3/uL (ref 4.0–10.5)

## 2017-07-15 LAB — COMPREHENSIVE METABOLIC PANEL
ALT: 10 U/L — AB (ref 14–54)
ANION GAP: 9 (ref 5–15)
AST: 20 U/L (ref 15–41)
Albumin: 2.9 g/dL — ABNORMAL LOW (ref 3.5–5.0)
Alkaline Phosphatase: 179 U/L — ABNORMAL HIGH (ref 38–126)
BUN: 8 mg/dL (ref 6–20)
CHLORIDE: 109 mmol/L (ref 101–111)
CO2: 17 mmol/L — AB (ref 22–32)
CREATININE: 0.44 mg/dL (ref 0.44–1.00)
Calcium: 8.2 mg/dL — ABNORMAL LOW (ref 8.9–10.3)
Glucose, Bld: 99 mg/dL (ref 65–99)
POTASSIUM: 3.6 mmol/L (ref 3.5–5.1)
SODIUM: 135 mmol/L (ref 135–145)
Total Bilirubin: 0.8 mg/dL (ref 0.3–1.2)
Total Protein: 7.5 g/dL (ref 6.5–8.1)

## 2017-07-15 LAB — PROTEIN / CREATININE RATIO, URINE
Creatinine, Urine: 74 mg/dL
Protein Creatinine Ratio: 2.92 mg/mg{Cre} — ABNORMAL HIGH (ref 0.00–0.15)
TOTAL PROTEIN, URINE: 216 mg/dL

## 2017-07-15 LAB — URIC ACID: URIC ACID, SERUM: 5 mg/dL (ref 2.3–6.6)

## 2017-07-15 MED ORDER — AMOXICILLIN 500 MG PO TABS: 500.0000 mg | ORAL_TABLET | Freq: Two times a day (BID) | ORAL | 0 refills | Status: DC

## 2017-07-15 NOTE — MAU Note (Signed)
Urine in lab 

## 2017-07-15 NOTE — MAU Note (Signed)
Pt states that she accidentally hit her stomach with the car door two days. No bleeding or LOF + FM

## 2017-07-15 NOTE — MAU Provider Note (Addendum)
History     CSN: [id number]  Arrival date and time: 07/15/17 1643   None     Chief Complaint  Patient presents with  . Abdominal Injury   HPI  Courtney Colon is a G3P2 at 37+[redacted] weeks gestation presenting to MAU from the office for fetal monitoring due to blunt abdominal trauma that occurred 2 days ago.  She reports that she accidentally ran into a truck door on the right side of her abdomen and now has bruising and pain.  She did not call our office or go to MAU for evaluation after the incident, and disclosed the information at her OB appointment today. She endorses good fetal movement since incident and denies VB or LOF.  She does report some pain on the right side of her abdomen and mild contractions, but nothing regular.  She and her partner seemed very concerned and requested fetal testing of the baby.  At the office, she also had 250mg  of proteinuria with normal BP of 120/82.  She reports some occasional mild headaches, but denies visual disturbances, or RUQ/epigastric pain.  She did note that she has not had much water today.  She denies dysuria, urgency, frequency, back pain, abnormal vaginal discharge, odors, vaginal itching or burning.   OB History    Gravida  3   Para  2   Term  2   Preterm      AB      Living  2     SAB      TAB      Ectopic      Multiple  0   Live Births  2           Past Medical History:  Diagnosis Date  . Anemia   . Hx of varicella     Past Surgical History:  Procedure Laterality Date  . CESAREAN SECTION N/A 01/11/2015   Procedure: CESAREAN SECTION;  Surgeon: Philip Aspen, DO;  Location: [facility];  Service: Obstetrics;  Laterality: N/A;  . MOUTH SURGERY  2011  . WISDOM TOOTH EXTRACTION      Family History  Problem Relation Age of Onset  . Hypertension Mother     Social History   Tobacco Use  . Smoking status: Never Smoker  . Smokeless tobacco: Never Used  Substance Use Topics  . Alcohol use: No  . Drug use: No     Allergies: No Known Allergies  Medications Prior to Admission  Medication Sig Dispense Refill Last Dose  . cephALEXin (KEFLEX) 500 MG capsule Take 1 capsule (500 mg total) by mouth 3 (three) times daily. 21 capsule 0   . ibuprofen (ADVIL,MOTRIN) 600 MG tablet Take 1 tablet (600 mg total) by mouth every 6 (six) hours. 30 tablet 0   . iron polysaccharides (NIFEREX) 150 MG capsule Take 1 capsule (150 mg total) by mouth daily. 45 capsule 0   . magnesium oxide (MAG-OX) 400 (241.3 Mg) MG tablet Take 1 tablet (400 mg total) by mouth daily. 45 tablet 0   . oxyCODONE (OXY IR/ROXICODONE) 5 MG immediate release tablet Take 1 tablet (5 mg total) by mouth every 4 (four) hours as needed (pain scale 4-7). 15 tablet 0   . Prenatal Vit-Fe Fumarate-FA (PRENATAL MULTIVITAMIN) TABS tablet Take 1 tablet by mouth daily at 12 noon.   Past Month at Unknown time    Review of Systems  Eyes: Negative for photophobia and visual disturbance.  Gastrointestinal: Positive for abdominal pain.  Genitourinary: Negative for dysuria,  frequency, vaginal bleeding and vaginal discharge.       Mild contractions  Neurological: Positive for headaches.       Mild headaches  Psychiatric/Behavioral: The patient is nervous/anxious.    Physical Exam   Blood pressure 120/86, pulse (!) 108, temperature 98.1 F (36.7 C), temperature source Oral, resp. rate 16, weight 64.5 kg (142 lb 4 oz), SpO2 99 %, unknown if currently breastfeeding.  Physical Exam  Constitutional: She is oriented to person, place, and time. She appears well-developed and well-nourished.  GI: Soft.  Ecchymosis noted on right abdomen adjacent to umbilicus   Neurological: She is alert and oriented to person, place, and time.  Skin: Skin is warm and dry.  Psychiatric:  Flat affect  VE at office: 3cm/60-70%/-3/vtx Fetal monitoring: Baseline: 145 bpm/ moderate variability/ + 15x15 accels/ 1 isolated variable deceleration, which resolved with reassuring fetal  monitoring following Toco: irregular ctxs  Results for orders placed or performed during the hospital encounter of 07/15/17 (from the past 24 hour(s))  Protein / creatinine ratio, urine     Status: Abnormal   Collection Time: 07/15/17  5:03 PM  Result Value Ref Range   Creatinine, Urine 74.00 mg/dL   Total Protein, Urine 216 mg/dL   Protein Creatinine Ratio 2.92 (H) 0.00 - 0.15 mg/mg[Cre]  Urinalysis, Routine w reflex microscopic     Status: Abnormal   Collection Time: 07/15/17  5:03 PM  Result Value Ref Range   Color, Urine YELLOW YELLOW   APPearance CLOUDY (A) CLEAR   Specific Gravity, Urine 1.015 1.005 - 1.030   pH 7.0 5.0 - 8.0   Glucose, UA NEGATIVE NEGATIVE mg/dL   Hgb urine dipstick MODERATE (A) NEGATIVE   Bilirubin Urine NEGATIVE NEGATIVE   Ketones, ur NEGATIVE NEGATIVE mg/dL   Protein, ur 161 (A) NEGATIVE mg/dL   Nitrite NEGATIVE NEGATIVE   Leukocytes, UA SMALL (A) NEGATIVE   RBC / HPF >50 (H) 0 - 5 RBC/hpf   WBC, UA >50 (H) 0 - 5 WBC/hpf   Bacteria, UA RARE (A) NONE SEEN   Squamous Epithelial / LPF 6-10 0 - 5   Mucus PRESENT   CBC     Status: Abnormal   Collection Time: 07/15/17  5:13 PM  Result Value Ref Range   WBC 9.3 4.0 - 10.5 K/uL   RBC 3.99 3.87 - 5.11 MIL/uL   Hemoglobin 10.4 (L) 12.0 - 15.0 g/dL   HCT 09.6 (L) 04.5 - 40.9 %   MCV 82.0 78.0 - 100.0 fL   MCH 26.1 26.0 - 34.0 pg   MCHC 31.8 30.0 - 36.0 g/dL   RDW 81.1 91.4 - 78.2 %   Platelets 198 150 - 400 K/uL  Comprehensive metabolic panel     Status: Abnormal   Collection Time: 07/15/17  5:13 PM  Result Value Ref Range   Sodium 135 135 - 145 mmol/L   Potassium 3.6 3.5 - 5.1 mmol/L   Chloride 109 101 - 111 mmol/L   CO2 17 (L) 22 - 32 mmol/L   Glucose, Bld 99 65 - 99 mg/dL   BUN 8 6 - 20 mg/dL   Creatinine, Ser 9.56 0.44 - 1.00 mg/dL   Calcium 8.2 (L) 8.9 - 10.3 mg/dL   Total Protein 7.5 6.5 - 8.1 g/dL   Albumin 2.9 (L) 3.5 - 5.0 g/dL   AST 20 15 - 41 U/L   ALT 10 (L) 14 - 54 U/L   Alkaline  Phosphatase 179 (H) 38 - 126 U/L  Total Bilirubin 0.8 0.3 - 1.2 mg/dL   GFR calc non Af Amer >60 >60 mL/min   GFR calc Af Amer >60 >60 mL/min   Anion gap 9 5 - 15  Uric acid     Status: None   Collection Time: 07/15/17  5:13 PM  Result Value Ref Range   Uric Acid, Serum 5.0 2.3 - 6.6 mg/dL    MAU Course  Procedures  MDM Continuous fetal monitoring Serial BPs every 15 minutes PIH labs Urinalysis and urine culture  OB limited ultrasound   Assessment and Plan  1. Single IUP at 37+4 with blunt abdominal trauma to abdomen     - Category 1 fetal tracing     - Sono without evidence of placental abruption     - AFI 24cm: plan to f/u AFI in 1 week 2. Proteinuria and hematuria in pregnancy without evidence of hypertension     - Clean catch urinalysis      - OB urine culture to r/o UTI     - Increase oral hydration      - Change Amoxicillin 500mg  BID to TID x 7 days - will update rx with patient's pharmacy - Discharge home - Strict FKC's daily - Labor precautions and warning s/s reviewed - UTI and vaginitis s/s reviewed  - F/u at [facility] for scheduled visit next week   Consult for plan of care: Dr. Gaylord Shihousins   Meredith C Sigmon 07/15/2017, 5:54 PM

## 2017-07-16 ENCOUNTER — Other Ambulatory Visit: Payer: Self-pay | Admitting: Obstetrics and Gynecology

## 2017-07-18 LAB — CULTURE, OB URINE: Culture: 60000 — AB

## 2017-07-27 ENCOUNTER — Encounter (HOSPITAL_COMMUNITY): Payer: Self-pay | Admitting: *Deleted

## 2017-07-27 ENCOUNTER — Other Ambulatory Visit: Payer: Self-pay

## 2017-07-27 ENCOUNTER — Inpatient Hospital Stay (HOSPITAL_COMMUNITY)
Admission: AD | Admit: 2017-07-27 | Discharge: 2017-07-29 | DRG: 807 | Disposition: A | Discharge status: Home / Self Care | Payer: No Typology Code available for payment source | Attending: Obstetrics | Admitting: Obstetrics

## 2017-07-27 ENCOUNTER — Inpatient Hospital Stay (HOSPITAL_COMMUNITY): Payer: No Typology Code available for payment source | Admitting: Anesthesiology

## 2017-07-27 DIAGNOSIS — O34219 Maternal care for unspecified type scar from previous cesarean delivery: Secondary | ICD-10-CM | POA: Diagnosis present

## 2017-07-27 DIAGNOSIS — O9902 Anemia complicating childbirth: Secondary | ICD-10-CM | POA: Diagnosis present

## 2017-07-27 DIAGNOSIS — Z3A39 39 weeks gestation of pregnancy: Secondary | ICD-10-CM

## 2017-07-27 DIAGNOSIS — Z3483 Encounter for supervision of other normal pregnancy, third trimester: Secondary | ICD-10-CM | POA: Diagnosis present

## 2017-07-27 DIAGNOSIS — D649 Anemia, unspecified: Secondary | ICD-10-CM | POA: Diagnosis present

## 2017-07-27 LAB — CBC
HEMATOCRIT: 31.5 % — AB (ref 36.0–46.0)
Hemoglobin: 10.1 g/dL — ABNORMAL LOW (ref 12.0–15.0)
MCH: 26.4 pg (ref 26.0–34.0)
MCHC: 32.1 g/dL (ref 30.0–36.0)
MCV: 82.2 fL (ref 78.0–100.0)
Platelets: 177 10*3/uL (ref 150–400)
RBC: 3.83 MIL/uL — AB (ref 3.87–5.11)
RDW: 15 % (ref 11.5–15.5)
WBC: 6.6 10*3/uL (ref 4.0–10.5)

## 2017-07-27 LAB — RPR: RPR: NONREACTIVE

## 2017-07-27 LAB — TYPE AND SCREEN
ABO/RH(D): O POS
Antibody Screen: NEGATIVE

## 2017-07-27 MED ORDER — COCONUT OIL OIL
1.0000 "application " | TOPICAL_OIL | Status: DC | PRN
  Filled 2017-07-27: qty 120

## 2017-07-27 MED ORDER — LIDOCAINE HCL (PF) 1 % IJ SOLN
INTRAMUSCULAR | Status: DC | PRN
  Administered 2017-07-27 (×2): 5 mL via EPIDURAL

## 2017-07-27 MED ORDER — DIPHENHYDRAMINE HCL 50 MG/ML IJ SOLN: 12.5000 mg | INTRAMUSCULAR | Status: DC | PRN

## 2017-07-27 MED ORDER — BENZOCAINE-MENTHOL 20-0.5 % EX AERO
1.0000 "application " | INHALATION_SPRAY | CUTANEOUS | Status: DC | PRN
  Administered 2017-07-27: 1 via TOPICAL
  Filled 2017-07-27: qty 56

## 2017-07-27 MED ORDER — OXYTOCIN BOLUS FROM INFUSION
500.0000 mL | Freq: Once | INTRAVENOUS | Status: AC
  Administered 2017-07-27: 500 mL via INTRAVENOUS

## 2017-07-27 MED ORDER — OXYCODONE-ACETAMINOPHEN 5-325 MG PO TABS: 1.0000 | ORAL_TABLET | ORAL | Status: DC | PRN

## 2017-07-27 MED ORDER — SOD CITRATE-CITRIC ACID 500-334 MG/5ML PO SOLN: 30.0000 mL | ORAL | Status: DC | PRN

## 2017-07-27 MED ORDER — LACTATED RINGERS IV SOLN
500.0000 mL | Freq: Once | INTRAVENOUS | Status: AC
  Administered 2017-07-27: 500 mL via INTRAVENOUS

## 2017-07-27 MED ORDER — IBUPROFEN 600 MG PO TABS
600.0000 mg | ORAL_TABLET | Freq: Four times a day (QID) | ORAL | Status: DC
  Administered 2017-07-27 (×2): 600 mg via ORAL
  Filled 2017-07-27 (×2): qty 1

## 2017-07-27 MED ORDER — EPHEDRINE 5 MG/ML INJ
10.0000 mg | INTRAVENOUS | Status: DC | PRN
  Filled 2017-07-27: qty 2
  Filled 2017-07-27: qty 4

## 2017-07-27 MED ORDER — OXYCODONE-ACETAMINOPHEN 5-325 MG PO TABS: 2.0000 | ORAL_TABLET | ORAL | Status: DC | PRN

## 2017-07-27 MED ORDER — OXYTOCIN 40 UNITS IN LACTATED RINGERS INFUSION - SIMPLE MED
2.5000 [IU]/h | INTRAVENOUS | Status: DC
  Administered 2017-07-27: 2.5 [IU]/h via INTRAVENOUS
  Filled 2017-07-27: qty 1000

## 2017-07-27 MED ORDER — NALBUPHINE HCL 10 MG/ML IJ SOLN
5.0000 mg | INTRAMUSCULAR | Status: AC
  Administered 2017-07-27: 5 mg via INTRAVENOUS
  Filled 2017-07-27: qty 1

## 2017-07-27 MED ORDER — WITCH HAZEL-GLYCERIN EX PADS: 1.0000 "application " | MEDICATED_PAD | CUTANEOUS | Status: DC | PRN

## 2017-07-27 MED ORDER — PHENYLEPHRINE 40 MCG/ML (10ML) SYRINGE FOR IV PUSH (FOR BLOOD PRESSURE SUPPORT)
80.0000 ug | PREFILLED_SYRINGE | INTRAVENOUS | Status: DC | PRN
  Administered 2017-07-27: 80 ug via INTRAVENOUS
  Filled 2017-07-27: qty 5

## 2017-07-27 MED ORDER — LIDOCAINE HCL (PF) 1 % IJ SOLN
30.0000 mL | INTRAMUSCULAR | Status: DC | PRN
  Filled 2017-07-27: qty 30

## 2017-07-27 MED ORDER — EPHEDRINE 5 MG/ML INJ
10.0000 mg | INTRAVENOUS | Status: DC | PRN
  Administered 2017-07-27: 10 mg via INTRAVENOUS
  Filled 2017-07-27: qty 2

## 2017-07-27 MED ORDER — ACETAMINOPHEN 325 MG PO TABS
650.0000 mg | ORAL_TABLET | ORAL | Status: DC | PRN
  Administered 2017-07-27 – 2017-07-28 (×2): 650 mg via ORAL
  Filled 2017-07-27 (×2): qty 2

## 2017-07-27 MED ORDER — ACETAMINOPHEN 325 MG PO TABS: 650.0000 mg | ORAL_TABLET | ORAL | Status: DC | PRN

## 2017-07-27 MED ORDER — DIBUCAINE 1 % RE OINT: 1.0000 "application " | TOPICAL_OINTMENT | RECTAL | Status: DC | PRN

## 2017-07-27 MED ORDER — LACTATED RINGERS IV SOLN
500.0000 mL | INTRAVENOUS | Status: DC | PRN
  Administered 2017-07-27 (×2): 1000 mL via INTRAVENOUS

## 2017-07-27 MED ORDER — IBUPROFEN 800 MG PO TABS
800.0000 mg | ORAL_TABLET | Freq: Four times a day (QID) | ORAL | Status: DC
  Administered 2017-07-28 – 2017-07-29 (×7): 800 mg via ORAL
  Filled 2017-07-27 (×7): qty 1

## 2017-07-27 MED ORDER — LACTATED RINGERS IV SOLN
INTRAVENOUS | Status: DC
  Administered 2017-07-27: 02:00:00 via INTRAVENOUS

## 2017-07-27 MED ORDER — DIPHENHYDRAMINE HCL 25 MG PO CAPS: 25.0000 mg | ORAL_CAPSULE | Freq: Four times a day (QID) | ORAL | Status: DC | PRN

## 2017-07-27 MED ORDER — SIMETHICONE 80 MG PO CHEW: 80.0000 mg | CHEWABLE_TABLET | ORAL | Status: DC | PRN

## 2017-07-27 MED ORDER — ONDANSETRON HCL 4 MG/2ML IJ SOLN
4.0000 mg | Freq: Four times a day (QID) | INTRAMUSCULAR | Status: DC | PRN
  Administered 2017-07-27: 4 mg via INTRAVENOUS
  Filled 2017-07-27: qty 2

## 2017-07-27 MED ORDER — SENNOSIDES-DOCUSATE SODIUM 8.6-50 MG PO TABS
2.0000 | ORAL_TABLET | ORAL | Status: DC
  Administered 2017-07-28 (×2): 2 via ORAL
  Filled 2017-07-27 (×2): qty 2

## 2017-07-27 MED ORDER — FLEET ENEMA 7-19 GM/118ML RE ENEM: 1.0000 | ENEMA | RECTAL | Status: DC | PRN

## 2017-07-27 MED ORDER — LACTATED RINGERS IV SOLN: 500.0000 mL | Freq: Once | INTRAVENOUS | Status: DC

## 2017-07-27 MED ORDER — FENTANYL 2.5 MCG/ML BUPIVACAINE 1/10 % EPIDURAL INFUSION (WH - ANES)
14.0000 mL/h | INTRAMUSCULAR | Status: DC | PRN
  Administered 2017-07-27: 14 mL/h via EPIDURAL

## 2017-07-27 MED ORDER — FENTANYL 2.5 MCG/ML BUPIVACAINE 1/10 % EPIDURAL INFUSION (WH - ANES)
INTRAMUSCULAR | Status: AC
  Filled 2017-07-27: qty 100

## 2017-07-27 MED ORDER — PHENYLEPHRINE 40 MCG/ML (10ML) SYRINGE FOR IV PUSH (FOR BLOOD PRESSURE SUPPORT)
PREFILLED_SYRINGE | INTRAVENOUS | Status: AC
  Administered 2017-07-27: 80 ug via INTRAVENOUS
  Filled 2017-07-27: qty 10

## 2017-07-27 NOTE — Anesthesia Preprocedure Evaluation (Signed)
Anesthesia Evaluation  Patient identified by MRN, date of birth, ID band Patient awake    Reviewed: Allergy & Precautions, H&P , NPO status   Airway Mallampati: II   Neck ROM: full    Dental   Pulmonary neg pulmonary ROS,    breath sounds clear to auscultation       Cardiovascular negative cardio ROS   Rhythm:regular Rate:Normal     Neuro/Psych    GI/Hepatic   Endo/Other    Renal/GU      Musculoskeletal   Abdominal   Peds  Hematology  (+) anemia ,   Anesthesia Other Findings   Reproductive/Obstetrics (+) Pregnancy                             Anesthesia Physical Anesthesia Plan  ASA: II  Anesthesia Plan: Epidural   Post-op Pain Management:    Induction: Intravenous  PONV Risk Score and Plan: 2 and Treatment may vary due to age or medical condition  Airway Management Planned: Natural Airway  Additional Equipment:   Intra-op Plan:   Post-operative Plan:   Informed Consent: I have reviewed the patients History and Physical, chart, labs and discussed the procedure including the risks, benefits and alternatives for the proposed anesthesia with the patient or authorized representative who has indicated his/her understanding and acceptance.     Plan Discussed with: Anesthesiologist  Anesthesia Plan Comments:         Anesthesia Quick Evaluation

## 2017-07-27 NOTE — Progress Notes (Signed)
[facility]. Pt with one episode of moderate bleeding at 0500 on labor and delivery. Marlinda Mikeanya Bailey, CNM called to bedside at that time to evaluate bleeding, no new orders given at that time, pads weighed. Scant to small bleeding for the remainder of the recovery.  [facility] pt had another episode of bleeding with a golf ball size clot while [facility] rub [facility]. Pad weighed at 98g of blood loss. Marlinda Mikeanya Bailey made aware, pt's vital signs stable, no new orders.

## 2017-07-27 NOTE — Plan of Care (Signed)
  Problem: Elimination: Goal: Will not experience complications related to urinary retention 07/27/2017 2000 by Princess PernaBurgess, Quadre Bristol D, RN Outcome: Progressing Note:  Pt able to void large amts, however, needs encouragement to empty bladder frequently to keep bleeding WNL. 07/27/2017 0855 by Claudette LawsBurgess, Sahira Cataldi D, RN Reactivated   Problem: Pain Managment: Goal: General experience of comfort will improve 07/27/2017 2000 by Princess PernaBurgess, Eugina Row D, RN Outcome: Progressing Note:  Pt has required scheduled Motin & heating packs for pain control.  States that pain management has been adequate with those modalities. 07/27/2017 0855 by Princess PernaBurgess, Tykeisha Peer D, RN Reactivated   Problem: Activity: Goal: Will verbalize the importance of balancing activity with adequate rest periods Outcome: Progressing Note:  Pt has been able to rest during this shift. Goal: Ability to tolerate increased activity will improve Outcome: Progressing Note:  Pt tolerates ambulating in room well & moves slowly.  Denies dizziness or pain during ambulation.   Problem: Life Cycle: Goal: Chance of risk for complications during the postpartum period will decrease Outcome: Progressing Note:  Fundas firm, but slightly deviated to right since delivery.  Lochia WNL, without clots.   Problem: Role Relationship: Goal: Ability to demonstrate positive interaction with newborn will improve Outcome: Progressing

## 2017-07-27 NOTE — Anesthesia Postprocedure Evaluation (Signed)
Anesthesia Post Note  Patient: Courtney Colon  Procedure(s) Performed: AN AD HOC LABOR EPIDURAL     Patient location during evaluation: Mother Baby Anesthesia Type: Epidural Level of consciousness: awake Pain management: pain level controlled Vital Signs Assessment: post-procedure vital signs reviewed and stable Respiratory status: spontaneous breathing Cardiovascular status: stable Postop Assessment: no headache, no backache, epidural receding and patient able to bend at knees Anesthetic complications: no    Last Vitals:  Vitals:   07/27/17 0601 07/27/17 0621  BP: 112/72 119/75  Pulse: 83 91  Resp:  18  Temp:  36.7 C  SpO2:  98%    Last Pain:  Vitals:   07/27/17 0621  TempSrc: Oral  PainSc:    Pain Goal: Patients Stated Pain Goal: 0 (07/27/17 0153)               [name],Derick Seminara

## 2017-07-27 NOTE — MAU Note (Signed)
Pt presents to MAU c/o ctx every . Pt denies bleeding or LOF.+FM.

## 2017-07-27 NOTE — Lactation Note (Signed)
This note was copied from a baby's chart. Lactation Consultation Note  Patient Name: Courtney Colon ZOXWR'UToday's Date: 07/27/2017 Reason for consult: Initial assessment;Term This is mom's third baby and she states she breastfed her previous babies without difficulty.  Mom chooses to do both breast and formula feed.  Stressed importance of putting baby to breast first before offering formula.  Mom states she understands.  She knows how to hand express.  Breastfeeding consultation services and support information given and reviewed.  Maternal Data Has patient been taught Hand Expression?: Yes Does the patient have breastfeeding experience prior to this delivery?: Yes  Feeding Feeding Type: Bottle Fed - Formula Nipple Type: Slow - flow  LATCH Score                   Interventions    Lactation Tools Discussed/Used     Consult Status Consult Status: Follow-up Date: 07/28/17 Follow-up type: In-patient    Huston FoleyMOULDEN, Jourdin Connors S 07/27/2017, 10:10 AM

## 2017-07-27 NOTE — Anesthesia Procedure Notes (Signed)
Epidural Patient location during procedure: OB Start time: 07/27/2017 2:59 AM End time: 07/27/2017 3:06 AM  Staffing Anesthesiologist: Achille RichHodierne, Lakela Kuba, MD Performed: anesthesiologist   Preanesthetic Checklist Completed: patient identified, site marked, pre-op evaluation, timeout performed, IV checked, risks and benefits discussed and monitors and equipment checked  Epidural Patient position: sitting Prep: DuraPrep Patient monitoring: heart rate, cardiac monitor, continuous pulse ox and blood pressure Approach: midline Location: L2-L3 Injection technique: LOR saline  Needle:  Needle type: Tuohy  Needle gauge: 17 G Needle length: 9 cm Needle insertion depth: 5 cm Catheter type: closed end flexible Catheter size: 19 Gauge Catheter at skin depth: 12 cm Test dose: negative and Other  Assessment Events: blood not aspirated, injection not painful, no injection resistance and negative IV test  Additional Notes Informed consent obtained prior to proceeding including risk of failure, 1% risk of PDPH, risk of minor discomfort and bruising.  Discussed rare but serious complications including epidural abscess, permanent nerve injury, epidural hematoma.  Discussed alternatives to epidural analgesia and patient desires to proceed.  Timeout performed pre-procedure verifying patient name, procedure, and platelet count.  Patient tolerated procedure well. Reason for block:procedure for pain

## 2017-07-28 LAB — CBC
HCT: 27.7 % — ABNORMAL LOW (ref 36.0–46.0)
Hemoglobin: 8.9 g/dL — ABNORMAL LOW (ref 12.0–15.0)
MCH: 26.6 pg (ref 26.0–34.0)
MCHC: 32.1 g/dL (ref 30.0–36.0)
MCV: 82.7 fL (ref 78.0–100.0)
Platelets: 160 10*3/uL (ref 150–400)
RBC: 3.35 MIL/uL — ABNORMAL LOW (ref 3.87–5.11)
RDW: 15.1 % (ref 11.5–15.5)
WBC: 6.7 10*3/uL (ref 4.0–10.5)

## 2017-07-28 LAB — BIRTH TISSUE RECOVERY COLLECTION (PLACENTA DONATION)

## 2017-07-28 MED ORDER — MAGNESIUM OXIDE 400 (241.3 MG) MG PO TABS
400.0000 mg | ORAL_TABLET | Freq: Every day | ORAL | Status: DC
  Administered 2017-07-28 – 2017-07-29 (×2): 400 mg via ORAL
  Filled 2017-07-28 (×2): qty 1

## 2017-07-28 MED ORDER — POLYSACCHARIDE IRON COMPLEX 150 MG PO CAPS
150.0000 mg | ORAL_CAPSULE | Freq: Every day | ORAL | Status: DC
  Administered 2017-07-28 – 2017-07-29 (×2): 150 mg via ORAL
  Filled 2017-07-28 (×2): qty 1

## 2017-07-28 NOTE — Lactation Note (Signed)
This note was copied from a baby's chart. Lactation Consultation Note  Patient Name: Courtney Colon ZOXWR'UToday's Date: 07/28/2017 Reason for consult: Follow-up assessment  Visited with P3 Mom of 33 hr old term baby, at 5% weight loss.  Mom choosing to offer formula (10-6725ml) until her milk volume comes in.  Mom states that baby latches well, and she isn't having any difficulty.  Latch scores of 9 noted. Encouraged STS as much as possible, and always to offer breast first.  Mom agrees. To ask for help prn.   Consult Status Consult Status: Follow-up Date: 07/29/17 Follow-up type: In-patient    Judee ClaraSmith, Brinley Rosete E 07/28/2017, 2:46 PM

## 2017-07-28 NOTE — Discharge Instructions (Signed)
Breastfeeding Challenges and Solutions Even though breastfeeding is natural, it can be challenging, especially in the first few weeks after childbirth. It is normal for problems to arise when starting to breastfeed your new baby, even if you have breastfed before. This document provides some solutions to the most common breastfeeding challenges. Challenges and solutions Challenge--Cracked or Sore Nipples Cracked or sore nipples are commonly experienced by breastfeeding mothers. Cracked or sore nipples often are caused by inadequate latching (when your baby's mouth attaches to your breast to breastfeed). Soreness can also happen if your baby is not positioned properly at your breast. Although nipple cracking and soreness are common during the first week after birth, nipple pain is never normal. If you experience nipple cracking or soreness that lasts longer than 1 week or nipple pain, call your health care provider or lactation consultant. Solution Ensure proper latching and positioning of your baby by following the steps below:  Find a comfortable place to sit or lie down, with your neck and back well supported.  Place a pillow or rolled up blanket under your baby to bring him or her to the level of your breast (if you are seated).  Make sure that your baby's abdomen is facing your abdomen.  Gently massage your breast. With your fingertips, massage from your chest wall toward your nipple in a circular motion. This encourages milk flow. You may need to continue this action during the feeding if your milk flows slowly.  Support your breast with 4 fingers underneath and your thumb above your nipple. Make sure your fingers are well away from your nipple and your babys mouth.  Stroke your baby's lips gently with your finger or nipple.  When your baby's mouth is open wide enough, quickly bring your baby to your breast, placing your entire nipple and as much of the colored area around your nipple  (areola) as possible into your baby's mouth. ? More areola should be visible above your baby's upper lip than below the lower lip. ? Your baby's tongue should be between his or her lower gum and your breast.  Ensure that your baby's mouth is correctly positioned around your nipple (latched). Your baby's lips should create a seal on your breast and be turned out (everted).  It is common for your baby to suck for about 2-3 minutes in order to start the flow of breast milk.  Signs that your baby has successfully latched on to your nipple include:  Quietly tugging or quietly sucking without causing you pain.  Swallowing heard between every 3-4 sucks.  Muscle movement above and in front of his or her ears with sucking.  Signs that your baby has not successfully latched on to nipple include:  Sucking sounds or smacking sounds from your baby while nursing.  Nipple pain.  Ensure that your breasts stay moisturized and healthy by:  Avoiding the use of soap on your nipples.  Wearing a supportive bra. Avoid wearing underwire-style bras or tight bras.  Air drying your nipples for 3-4 minutes after each feeding.  Using only cotton bra pads to absorb breast milk leakage. Leaking of breast milk between feedings is normal. Be sure to change the pads if they become soaked with milk.  Using lanolin on your nipples after nursing. Lanolin helps to maintain your skin's normal moisture barrier. If you use pure lanolin you do not need to wash it off before feeding your baby again. Pure lanolin is not toxic to your baby. You may also  hand express a few drops of breast milk and gently massage that milk into your nipples, allowing it to air dry.  Challenge--Breast Engorgement Breast engorgement is the overfilling of your breasts with breast milk. In the first few weeks after giving birth, you may experience breast engorgement. Breast engorgement can make your breasts throb and feel hard, tightly stretched,  warm, and tender. Engorgement peaks about the fifth day after you give birth. Having breast engorgement does not mean you have to stop breastfeeding your baby. Solution  Breastfeed when you feel the need to reduce the fullness of your breasts or when your baby shows signs of hunger. This is called "breastfeeding on demand."  Newborns (babies younger than 4 weeks) often breastfeed every 1-3 hours during the day. You may need to awaken your baby to feed if he or she is asleep at a feeding time.  Do not allow your baby to sleep longer than 5 hours during the night without a feeding.  Pump or hand express breast milk before breastfeeding to soften your breast, areola, and nipple.  Apply warm, moist heat (in the shower or with warm water-soaked hand towels) just before feeding or pumping, or massage your breast before or during breastfeeding. This increases circulation and helps your milk to flow.  Completely empty your breasts when breastfeeding or pumping. Afterward, wear a snug bra (nursing or regular) or tank top for 1-2 days to signal your body to slightly decrease milk production. Only wear snug bras or tank tops to treat engorgement. Tight bras typically should be avoided by breastfeeding mothers. Once engorgement is relieved, return to wearing regular, loose-fitting clothes.  Apply ice packs to your breasts to lessen the pain from engorgement and relieve swelling, unless the ice is uncomfortable for you.  Do not delay feedings. Try to relax when it is time to feed your baby. This helps to trigger your "let-down reflex," which releases milk from your breast.  Ensure your baby is latched on to your breast and positioned properly while breastfeeding.  Allow your baby to remain at your breast as long as he or she is latched on well and actively sucking. Your baby will let you know when he or she is done breastfeeding by pulling away from your breast or falling asleep.  Avoid introducing bottles  or pacifiers to your baby in the early weeks of breastfeeding. Wait to introduce these things until after resolving any breastfeeding challenges.  Try to pump your milk on the same schedule as when your baby would breastfeed if you are returning to work or away from home for an extended period.  Drink plenty of fluids to avoid dehydration, which can eventually put you at greater risk of breast engorgement.  If you follow these suggestions, your engorgement should improve in 24-48 hours. If you are still experiencing difficulty, call your lactation consultant or health care provider. Challenge--Plugged Milk Ducts Plugged milk ducts occur when the duct does not drain milk effectively and becomes swollen. Wearing a tight-fitting nursing bra or having difficulty with latching may cause plugged milk ducts. Not drinking enough water (8-10 c [1.9-2.4 L] per day) can contribute to plugged milk ducts. Once a duct has become plugged, hard lumps, soreness, and redness may develop in your breast. Solution Do not delay feedings. Feed your baby frequently and try to empty your breasts of milk at each feeding. Try breastfeeding from the affected side first so there is a better chance that the milk will drain completely from  that breast. Apply warm, moist towels to your breasts for 5-10 minutes before feeding. Alternatively, a hot shower right before breastfeeding can provide the moist heat that can encourage milk flow. Gentle massage of the sore area before and during a feeding may also help. Avoid wearing tight clothing or bras that put pressure on your breasts. Wear bras that offer good support to your breasts, but avoid underwire bras. If you have a plugged milk duct and develop a fever, you need to see your health care provider. Challenge--Mastitis Mastitis is inflammation of your breast. It usually is caused by a bacterial infection and can cause flu-like symptoms. You may develop redness in your breast and a  fever. Often when mastitis occurs, your breast becomes firm, warm, and very painful. The most common causes of mastitis are poor latching, ineffective sucking from your baby, consistent pressure on your breast (possibly from wearing a tight-fitting bra or shirt that restricts the milk flow), unusual stress or fatigue, or missed feedings. Solution You will be given antibiotic medicine to treat the infection. It is still important to breastfeed frequently to empty your breasts. Continuing to breastfeed while you recover from mastitis will not harm your baby. Make sure your baby is positioned properly during every feeding. Apply moist heat to your breasts for a few minutes before feeding to help the milk flow and to help your breasts empty more easily. Challenge--Thrush Ritta Slot is a yeast infection that can form on your nipples, in your breast, or in your baby's mouth. It causes itching, soreness, burning or stabbing pain, and sometimes a rash. Solution You will be given a medicated ointment for your nipples, and your baby will be given a liquid medicine for his or her mouth. It is important that you and your baby are treated at the same time because thrush can be passed between you and your baby. Change disposable nursing pads often. Any bras, towels, or clothing that come in contact with infected areas of your body or your baby's body need to be washed in very hot water every day. Wash your hands and your baby's hands often. All pacifiers, bottle nipples, or toys your baby puts in his or her mouth should be boiled once a day for 20 minutes. After 1 week of treatment, discard pacifiers and bottle nipples and buy new ones. All breast pump parts that touch the milk need to be boiled for 20 minutes every day. Challenge--Low Milk Supply You may not be producing enough milk if your baby is not gaining the proper amount of weight. Breast milk production is based on a supply-and-demand system. Your milk supply depends  on how frequently and effectively your baby empties your breast. Solution The more you breastfeed and pump, the more breast milk you will produce. It is important that your baby empties at least one of your breasts at each feeding. If this is not happening, then use a breast pump or hand express any milk that remains. This will help to drain as much milk as possible at each feeding. It will also signal your body to produce more milk. If your baby is not emptying your breasts, it may be due to latching, sucking, or positioning problems. If low milk supply continues after addressing these issues, contact your health care provider or a lactation specialist as soon as possible. Challenge--Inverted or Flat Nipples Some women have nipples that turn inward instead of protruding outward. Other women have nipples that are flat. Inverted or flat nipples can  sometimes make it more difficult for your baby to latch onto your breast. Solution You may be given a small device that pulls out inverted nipples. This device should be applied right before your baby is brought to your breast. You can also try using a breast pump for a short time before placing the baby at your breast. The pump can pull your nipple outwards to help your infant latch more easily. The baby's sucking motion will help the inverted nipple protrude as well. If you have flat nipples, encourage your baby to latch onto your breast and feed frequently in the early days after birth. This will give your baby practice latching on correctly while your breast is still soft. When your milk supply increases, between the second and fifth day after birth and your breasts become full, your baby will have an easier time latching. Contact a lactation consultant if you still have concerns. She or he can teach you additional techniques to address breastfeeding problems related to nipple shape and position. Where to find more information: [employer]:  www.llli.org This information is not intended to replace advice given to you by your health care provider. Make sure you discuss any questions you have with your health care provider. Document Released: 07/06/2005 Document Revised: 06/26/2015 Document Reviewed: 07/08/2012 Elsevier Interactive Patient Education  2017 [employer] Postpartum Care After Vaginal Delivery The period of time right after you deliver your newborn is called the postpartum period. What kind of medical care will I receive?  You may continue to receive fluids and medicines through an IV tube inserted into one of your veins.  If an incision was made near your vagina (episiotomy) or if you had some vaginal tearing during delivery, cold compresses may be placed on your episiotomy or your tear. This helps to reduce pain and swelling.  You may be given a squirt bottle to use when you go to the bathroom. You may use this until you are comfortable wiping as usual. To use the squirt bottle, follow these steps: ? Before you urinate, fill the squirt bottle with warm water. Do not use hot water. ? After you urinate, while you are sitting on the toilet, use the squirt bottle to rinse the area around your urethra and vaginal opening. This rinses away any urine and blood. ? You may do this instead of wiping. As you start healing, you may use the squirt bottle before wiping yourself. Make sure to wipe gently. ? Fill the squirt bottle with clean water every time you use the bathroom.  You will be given sanitary pads to wear. How can I expect to feel?  You may not feel the need to urinate for several hours after delivery.  You will have some soreness and pain in your abdomen and vagina.  If you are breastfeeding, you may have uterine contractions every time you breastfeed for up to several weeks postpartum. Uterine contractions help your uterus return to its normal size.  It is normal to have vaginal bleeding (lochia) after  delivery. The amount and appearance of lochia is often similar to a menstrual period in the first week after delivery. It will gradually decrease over the next few weeks to a dry, yellow-brown discharge. For most women, lochia stops completely by 6-8 weeks after delivery. Vaginal bleeding can vary from woman to woman.  Within the first few days after delivery, you may have breast engorgement. This is when your breasts feel heavy, full, and uncomfortable. Your breasts  may also throb and feel hard, tightly stretched, warm, and tender. After this occurs, you may have milk leaking from your breasts.Your health care provider can help you relieve discomfort due to breast engorgement. Breast engorgement should go away within a few days.  You may feel more sad or worried than normal due to hormonal changes after delivery. These feelings should not last more than a few days. If these feelings do not go away after several days, speak with your health care provider. How should I care for myself?  Tell your health care provider if you have pain or discomfort.  Drink enough water to keep your urine clear or pale yellow.  Wash your hands thoroughly with soap and water for at least 20 seconds after changing your sanitary pads, after using the toilet, and before holding or feeding your baby.  If you are not breastfeeding, avoid touching your breasts a lot. Doing this can make your breasts produce more milk.  If you become weak or lightheaded, or you feel like you might faint, ask for help before: ? Getting out of bed. ? Showering.  Change your sanitary pads frequently. Watch for any changes in your flow, such as a sudden increase in volume, a change in color, the passing of large blood clots. If you pass a blood clot from your vagina, save it to show to your health care provider. Do not flush blood clots down the toilet without having your health care provider look at them.  Make sure that all your  vaccinations are up to date. This can help protect you and your baby from getting certain diseases. You may need to have immunizations done before you leave the hospital.  If desired, talk with your health care provider about methods of family planning or birth control (contraception). How can I start bonding with my baby? Spending as much time as possible with your baby is very important. During this time, you and your baby can get to know each other and develop a bond. Having your baby stay with you in your room (rooming in) can give you time to get to know your baby. Rooming in can also help you become comfortable caring for your baby. Breastfeeding can also help you bond with your baby. How can I plan for returning home with my baby?  Make sure that you have a car seat installed in your vehicle. ? Your car seat should be checked by a certified car seat installer to make sure that it is installed safely. ? Make sure that your baby fits into the car seat safely.  Ask your health care provider any questions you have about caring for yourself or your baby. Make sure that you are able to contact your health care provider with any questions after leaving the hospital. This information is not intended to replace advice given to you by your health care provider. Make sure you discuss any questions you have with your health care provider. Document Released: 11/09/2006 Document Revised: 06/17/2015 Document Reviewed: 12/17/2014 Elsevier Interactive Patient Education  2018 [employer]. Postpartum Depression and Baby Blues The postpartum period begins right after the birth of a baby. During this time, there is often a great amount of joy and excitement. It is also a time of many changes in the life of the parents. Regardless of how many times a mother gives birth, each child brings new challenges and dynamics to the family. It is not unusual to have feelings of excitement along with  confusing shifts in  moods, emotions, and thoughts. All mothers are at risk of developing postpartum depression or the "baby blues." These mood changes can occur right after giving birth, or they may occur many months after giving birth. The baby blues or postpartum depression can be mild or severe. Additionally, postpartum depression can go away rather quickly, or it can be a long-term condition. What are the causes? Raised hormone levels and the rapid drop in those levels are thought to be a main cause of postpartum depression and the baby blues. A number of hormones change during and after pregnancy. Estrogen and progesterone usually decrease right after the delivery of your baby. The levels of thyroid hormone and various cortisol steroids also rapidly drop. Other factors that play a role in these mood changes include major life events and genetics. What increases the risk? If you have any of the following risks for the baby blues or postpartum depression, know what symptoms to watch out for during the postpartum period. Risk factors that may increase the likelihood of getting the baby blues or postpartum depression include:  Having a personal or family history of depression.  Having depression while being pregnant.  Having premenstrual mood issues or mood issues related to oral contraceptives.  Having a lot of life stress.  Having marital conflict.  Lacking a social support network.  Having a baby with special needs.  Having health problems, such as diabetes.  What are the signs or symptoms? Symptoms of baby blues include:  Brief changes in mood, such as going from extreme happiness to sadness.  Decreased concentration.  Difficulty sleeping.  Crying spells, tearfulness.  Irritability.  Anxiety.  Symptoms of postpartum depression typically begin within the first month after giving birth. These symptoms include:  Difficulty sleeping or excessive sleepiness.  Marked weight  loss.  Agitation.  Feelings of worthlessness.  Lack of interest in activity or food.  Postpartum psychosis is a very serious condition and can be dangerous. Fortunately, it is rare. Displaying any of the following symptoms is cause for immediate medical attention. Symptoms of postpartum psychosis include:  Hallucinations and delusions.  Bizarre or disorganized behavior.  Confusion or disorientation.  How is this diagnosed? A diagnosis is made by an evaluation of your symptoms. There are no medical or lab tests that lead to a diagnosis, but there are various questionnaires that a health care provider may use to identify those with the baby blues, postpartum depression, or psychosis. Often, a screening tool called the [place] Postnatal Depression Scale is used to diagnose depression in the postpartum period. How is this treated? The baby blues usually goes away on its own in 1-2 weeks. Social support is often all that is needed. You will be encouraged to get adequate sleep and rest. Occasionally, you may be given medicines to help you sleep. Postpartum depression requires treatment because it can last several months or longer if it is not treated. Treatment may include individual or group therapy, medicine, or both to address any social, physiological, and psychological factors that may play a role in the depression. Regular exercise, a healthy diet, rest, and social support may also be strongly recommended. Postpartum psychosis is more serious and needs treatment right away. Hospitalization is often needed. Follow these instructions at home:  Get as much rest as you can. Nap when the baby sleeps.  Exercise regularly. Some women find yoga and walking to be beneficial.  Eat a balanced and nourishing diet.  Do little things that you  enjoy. Have a cup of tea, take a bubble bath, read your favorite magazine, or listen to your favorite music.  Avoid alcohol.  Ask for help with household  chores, cooking, grocery shopping, or running errands as needed. Do not try to do everything.  Talk to people close to you about how you are feeling. Get support from your partner, family members, friends, or other new moms.  Try to stay positive in how you think. Think about the things you are grateful for.  Do not spend a lot of time alone.  Only take over-the-counter or prescription medicine as directed by your health care provider.  Keep all your postpartum appointments.  Let your health care provider know if you have any concerns. Contact a health care provider if: You are having a reaction to or problems with your medicine. Get help right away if:  You have suicidal feelings.  You think you may harm the baby or someone else. This information is not intended to replace advice given to you by your health care provider. Make sure you discuss any questions you have with your health care provider. Document Released: 10/17/2003 Document Revised: 06/20/2015 Document Reviewed: 10/24/2012 Elsevier Interactive Patient Education  2017 [employer]

## 2017-07-28 NOTE — Progress Notes (Signed)
Patient ID: Alver Sorrownita Mikles, female   DOB: 12/28/1995, [age] y.o.   MRN: [id number]  Post Partum Day # 1, s/p vaginal birth after cesarean section  Subjective:  Doing well, resting in bed with significant other. Infant sleeping in crib at bedside.   Denies difficulty breathing or respiratory distress, chest pain, abdominal pain, excessive vaginal bleeding, dysuria, and leg pain or swelling.   Objective:  Temp:  [98.2 F (36.8 C)-98.3 F (36.8 C)] 98.2 F (36.8 C) (07/03 1447) Pulse Rate:  [75-103] 103 (07/03 1447) Resp:  [17-20] 17 (07/03 1447) BP: (97-127)/(62-82) 127/74 (07/03 1447) SpO2:  [99 %-100 %] 100 % (07/03 1447)  Physical Exam:   General: alert and cooperative   Lungs: clear to auscultation bilaterally  Breasts: deferred, no complaints  Heart: normal apical impulse  Abdomen: soft, non-tender; bowel sounds normal; no masses,  no organomegaly  Pelvis: Lochia: appropriate, Uterine Fundus: firm  Extremities: DVT Evaluation: No evidence of DVT seen on physical exam.  Recent Labs    07/27/17 0208 07/28/17 0549  HGB 10.1* 8.9*  HCT 31.5* 27.7*    Assessment:  22 year old G3P3 PPD#1, s/p vaginal birth after cesarean section, Rh positive, anemia  Plan:  Iron supplementation, see orders.  Reviewed red flag symptoms and when to call.   Plan discharge tomorrow.    LOS: 1 day   Gunnar BullaLawhorn, Cashmere Dingley Michelle, CNM [facility] 07/28/2017 3:54 PM

## 2017-07-29 DIAGNOSIS — O9902 Anemia complicating childbirth: Secondary | ICD-10-CM | POA: Diagnosis present

## 2017-07-29 LAB — URINE CULTURE
Culture: NO GROWTH
Special Requests: NORMAL

## 2017-07-29 MED ORDER — COCONUT OIL OIL: 1.0000 "application " | TOPICAL_OIL | 0 refills | Status: DC | PRN

## 2017-07-29 MED ORDER — ACETAMINOPHEN 325 MG PO TABS: 650.0000 mg | ORAL_TABLET | ORAL | Status: DC | PRN

## 2017-07-29 MED ORDER — MAGNESIUM OXIDE 400 (241.3 MG) MG PO TABS: 400.0000 mg | ORAL_TABLET | Freq: Every day | ORAL | Status: DC

## 2017-07-29 MED ORDER — BENZOCAINE-MENTHOL 20-0.5 % EX AERO: 1.0000 "application " | INHALATION_SPRAY | CUTANEOUS | Status: DC | PRN

## 2017-07-29 MED ORDER — POLYSACCHARIDE IRON COMPLEX 150 MG PO CAPS: 150.0000 mg | ORAL_CAPSULE | Freq: Every day | ORAL | Status: DC

## 2017-07-29 MED ORDER — IBUPROFEN 800 MG PO TABS: 800.0000 mg | ORAL_TABLET | Freq: Four times a day (QID) | ORAL | 0 refills | Status: DC

## 2017-07-29 NOTE — Lactation Note (Signed)
This note was copied from a baby's chart. Lactation Consultation Note  Patient Name: Courtney Colon Today's Date: 07/29/2017    Visited with P3 Mom of term baby on day of discharge, baby 4754 hrs old and at 8% weight loss.  Output good.  Mom denies any pain or difficult with latching baby. Mom has been offering formula after breastfeeding baby first, but now that breasts are feeling fuller, is not giving baby formula as much.    Talked about importance of frequent breastfeeding, keeping baby STS as much as possible.  Hand pump given with instructions on use.  Engorgement prevention and treatment discussed.  Mom encouraged to call for guidance prn. Mom aware of OP lactation services.    Courtney Colon, Courtney Colon 07/29/2017, 11:01 AM

## 2017-07-29 NOTE — Discharge Summary (Signed)
Obstetric Discharge Summary Reason for Admission: onset of labor Prenatal Procedures: ultrasound Intrapartum Procedures: spontaneous vaginal delivery and epidural Postpartum Procedures: none Complications-Operative and Postpartum: none Hemoglobin  Date Value Ref Range Status  07/28/2017 8.9 (L) 12.0 - 15.0 g/dL Final   HCT  Date Value Ref Range Status  07/28/2017 27.7 (L) 36.0 - 46.0 % Final    Physical Exam:  General: alert, cooperative and no distress Lochia: appropriate Uterine Fundus: firm Incision: NA DVT Evaluation: No cords or calf tenderness. No significant calf/ankle edema.  Discharge Diagnoses:  Patient Active Problem List   Diagnosis Date Noted  . VBAC 7/2 07/27/2017    Priority: Medium  . Maternal anemia, with delivery 07/29/2017  . Postpartum care following VBAC (7/2) 07/27/2017  . Previous cesarean section complicating pregnancy 02/06/2016    Discharge Information: Date: 07/29/2017 Activity: pelvic rest Diet: routine Medications:  Allergies as of 07/29/2017   No Known Allergies     Medication List    TAKE these medications   acetaminophen 325 MG tablet Commonly known as:  TYLENOL Take 2 tablets (650 mg total) by mouth every 4 (four) hours as needed (for pain scale < 4).   benzocaine-Menthol 20-0.5 % Aero Commonly known as:  DERMOPLAST Apply 1 application topically as needed for irritation (perineal discomfort).   coconut oil Oil Apply 1 application topically as needed.   ibuprofen 800 MG tablet Commonly known as:  ADVIL,MOTRIN Take 1 tablet (800 mg total) by mouth every 6 (six) hours.   iron polysaccharides 150 MG capsule Commonly known as:  NIFEREX Take 1 capsule (150 mg total) by mouth daily.   magnesium oxide 400 (241.3 Mg) MG tablet Commonly known as:  MAG-OX Take 1 tablet (400 mg total) by mouth daily.   prenatal multivitamin Tabs tablet Take 1 tablet by mouth daily at 12 noon.            Discharge Care Instructions  (From  admission, onward)        Start     Ordered   07/29/17 0000  Discharge wound care:    Comments:  Sitz baths 2 times /day with warm water x 1 week   07/29/17 1102     Condition: stable Instructions: refer to practice specific booklet Discharge to: home Follow-up Information    Courtney Colon, Courtney Colon, Courtney Colon. Schedule an appointment as soon as possible for a visit in 6 week(s).   Specialty:  Obstetrics and Gynecology Contact information: [place] [address] [place] [address] [phone]           Newborn Data: Live born female  Birth Weight: 8 lb 13.1 oz (4000 g) APGAR: 9, 10  Newborn Delivery   Birth date/time:  07/27/2017 04:53:00 Delivery type:  Vaginal, Spontaneous     Home with mother.  Courtney Colon 07/29/2017, 11:03 AM

## 2017-08-02 NOTE — H&P (Signed)
OB ADMISSION/ HISTORY & PHYSICAL:  Admission Date: 07/27/2017  1:38 AM  Admit Diagnosis: active labor  Courtney Colon is a 22 y.o. female presenting for onset of labor.  Prenatal History: [id number]   EDC : 08/01/2017, by Other Basis  [facility]  Primary Ob Provider: Helayne SeminolePaul CNM Prenatal course complicated by hx CS with successful VBAC - desires repeat VBAC  Prenatal Labs: ABO, Rh: --/--/O POS (07/02 0208) Antibody: NEG (07/02 0208) Rubella: Immune (04/04 0000)  RPR: Non Reactive (07/02 0208)  HBsAg: Negative (04/04 0000)  HIV: Non-reactive (04/04 0000)  GBS: Negative (05/10 0000)   OB History  Gravida Para Term Preterm AB Living  3 3 3     3   SAB TAB Ectopic Multiple Live Births        0 3    # Outcome Date GA Lbr Len/2nd Weight Sex Delivery Anes PTL Lv  3 Term 07/27/17 4328w2d 04:20 / 00:32 4 kg (8 lb 13.1 oz) F Vag-Spont EPI  LIV  2 Term 02/25/16 7645w1d 07:16 / 01:46 3.83 kg (8 lb 7.1 oz) M VBAC EPI  LIV  1 Term 01/11/15 2328w2d 10:11 / 04:31 2.985 kg (6 lb 9.3 oz) F CS-LTranv EPI  LIV     Birth Comments: Newborn Screen ZD#[id number] Barcode: [id number] Date collected: 01/12/2015 Hgb-normal, FA    Medical / Surgical History :  Past medical history:  Past Medical History:  Diagnosis Date  . Anemia   . Hx of varicella      Past surgical history:  Past Surgical History:  Procedure Laterality Date  . CESAREAN SECTION N/A 01/11/2015   Procedure: CESAREAN SECTION;  Surgeon: Philip AspenSidney Callahan, DO;  Location: [facility];  Service: Obstetrics;  Laterality: N/A;  . MOUTH SURGERY  2011  . WISDOM TOOTH EXTRACTION      Family History:  Family History  Problem Relation Age of Onset  . Hypertension Mother      Social History:  reports that she has never smoked. She has never used smokeless tobacco. She reports that she does not drink alcohol or use drugs.  Allergies: Patient has no known allergies.    Current Medications at time of admission:  Prior  to Admission medications   Medication Sig Start Date End Date Taking? Authorizing Provider  Prenatal Vit-Fe Fumarate-FA (PRENATAL MULTIVITAMIN) TABS tablet Take 1 tablet by mouth daily at 12 noon.   Yes [provider]   Review of Systems: Active FM onset of ctx @ midnight every 2-4 minutes bloody show present  Physical Exam: VS: Blood pressure 102/70, pulse 80, temperature 98.4 F (36.9 C), temperature source Oral, resp. rate 16, height 5' (1.524 m), weight 65.8 kg (145 lb), SpO2 99 %, unknown if currently breastfeeding.  General: alert and oriented, appears uncomfortable Heart: RRR Lungs: Clear lung fields Abdomen: Gravid, soft and non-tender, non-distended / uterus: gravid Extremities: trace  edema  Genitalia / VE: Dilation: 10 Effacement (%): 100 Station: Plus 2 Exam by:: Marlinda Mikeanya Elias Bordner, CNM  IEP:PIRJJOACFHR:category 2 with occasional variable deceleration TOCO: ctx Q 2-4  Assessment: [redacted] weeks gestation active stage of labor FHR category 2   Plan:  Admit Epidural Anticipate repeat VBAC  Dr Billy Coastaavon notified of admission / plan of care   Marlinda Mikeanya Florence Yeung CNM, MSN, [facility] 08/02/2017, 9:02 AM

## 2018-01-25 ENCOUNTER — Emergency Department (HOSPITAL_COMMUNITY)
Admission: EM | Admit: 2018-01-25 | Discharge: 2018-01-25 | Disposition: A | Discharge status: Home / Self Care | Payer: PRIVATE HEALTH INSURANCE | Attending: Emergency Medicine | Admitting: Emergency Medicine

## 2018-01-25 ENCOUNTER — Encounter (HOSPITAL_COMMUNITY): Payer: Self-pay | Admitting: *Deleted

## 2018-01-25 DIAGNOSIS — R05 Cough: Secondary | ICD-10-CM | POA: Diagnosis not present

## 2018-01-25 DIAGNOSIS — R0981 Nasal congestion: Secondary | ICD-10-CM | POA: Insufficient documentation

## 2018-01-25 DIAGNOSIS — J029 Acute pharyngitis, unspecified: Secondary | ICD-10-CM | POA: Insufficient documentation

## 2018-01-25 DIAGNOSIS — Z79899 Other long term (current) drug therapy: Secondary | ICD-10-CM | POA: Insufficient documentation

## 2018-01-25 LAB — GROUP A STREP BY PCR: Group A Strep by PCR: NOT DETECTED

## 2018-01-25 NOTE — ED Provider Notes (Signed)
[facility] Provider Note   CSN: [id number] Arrival date & time: 01/25/18  1343     History   Chief Complaint Chief Complaint  Patient presents with  . Sore Throat    HPI Courtney Colon is a 22 y.o. female.  HPI   22 year old female presents today with complaints of sore throat.  Patient symptoms started 3 days ago with rhinorrhea nasal congestion nonproductive cough and sore throat.  Patient notes painful swallowing, no difficulty swallowing.  She reports fever yesterday, none today.  She notes her significant other is sick with similar symptoms.  No medications prior to arrival.  She notes using Robitussin last night.  Past Medical History:  Diagnosis Date  . Anemia   . Hx of varicella     Patient Active Problem List   Diagnosis Date Noted  . Maternal anemia, with delivery 07/29/2017  . VBAC 7/2 07/27/2017  . Postpartum care following VBAC (7/2) 07/27/2017  . Previous cesarean section complicating pregnancy 02/06/2016    Past Surgical History:  Procedure Laterality Date  . CESAREAN SECTION N/A 01/11/2015   Procedure: CESAREAN SECTION;  Surgeon: Philip AspenSidney Callahan, DO;  Location: [facility];  Service: Obstetrics;  Laterality: N/A;  . MOUTH SURGERY  2011  . WISDOM TOOTH EXTRACTION       OB History    Gravida  3   Para  3   Term  3   Preterm      AB      Living  3     SAB      TAB      Ectopic      Multiple  0   Live Births  3            Home Medications    Prior to Admission medications   Medication Sig Start Date End Date Taking? Authorizing Provider  acetaminophen (TYLENOL) 325 MG tablet Take 2 tablets (650 mg total) by mouth every 4 (four) hours as needed (for pain scale < 4). 07/29/17   Neta MendsPaul, Daniela C, CNM  benzocaine-Menthol (DERMOPLAST) 20-0.5 % AERO Apply 1 application topically as needed for irritation (perineal discomfort). 07/29/17   Neta MendsPaul, Daniela C, CNM  coconut oil OIL Apply 1 application topically  as needed. 07/29/17   Neta MendsPaul, Daniela C, CNM  ibuprofen (ADVIL,MOTRIN) 800 MG tablet Take 1 tablet (800 mg total) by mouth every 6 (six) hours. 07/29/17   Neta MendsPaul, Daniela C, CNM  iron polysaccharides (NIFEREX) 150 MG capsule Take 1 capsule (150 mg total) by mouth daily. 07/29/17   Neta MendsPaul, Daniela C, CNM  magnesium oxide (MAG-OX) 400 (241.3 Mg) MG tablet Take 1 tablet (400 mg total) by mouth daily. 07/29/17   Neta MendsPaul, Daniela C, CNM  Prenatal Vit-Fe Fumarate-FA (PRENATAL MULTIVITAMIN) TABS tablet Take 1 tablet by mouth daily at 12 noon.    [provider]    Family History Family History  Problem Relation Age of Onset  . Hypertension Mother     Social History Social History   Tobacco Use  . Smoking status: Never Smoker  . Smokeless tobacco: Never Used  Substance Use Topics  . Alcohol use: No  . Drug use: No     Allergies   Patient has no known allergies.   Review of Systems Review of Systems   Physical Exam Updated Vital Signs BP (!) 129/99 (BP Location: Right Arm)   Pulse 98   Temp 98.7 F (37.1 C) (Oral)   Resp 16  SpO2 99%   Physical Exam Vitals signs and nursing note reviewed.  Constitutional:      Appearance: She is well-developed.  HENT:     Head: Normocephalic and atraumatic.     Comments: Minimally erythematous oropharynx, no tonsillar swelling or exudate, no posterior oropharynx no swelling or edema, no pooling of secretions voice normal-bilateral TMs normal, no cervical lymphadenopathy Eyes:     General: No scleral icterus.       Right eye: No discharge.        Left eye: No discharge.     Conjunctiva/sclera: Conjunctivae normal.     Pupils: Pupils are equal, round, and reactive to light.  Neck:     Musculoskeletal: Normal range of motion.     Vascular: No JVD.     Trachea: No tracheal deviation.  Pulmonary:     Effort: Pulmonary effort is normal.     Breath sounds: No stridor.  Neurological:     Mental Status: She is alert and oriented to person,  place, and time.     Coordination: Coordination normal.  Psychiatric:        Behavior: Behavior normal.        Thought Content: Thought content normal.        Judgment: Judgment normal.      ED Treatments / Results  Labs (all labs ordered are listed, but only abnormal results are displayed) Labs Reviewed  GROUP A STREP BY PCR    EKG None  Radiology No results found.  Procedures Procedures (including critical care time)  Medications Ordered in ED Medications - No data to display   Initial Impression / Assessment and Plan / ED Course  I have reviewed the triage vital signs and the nursing notes.  Pertinent labs & imaging results that were available during my care of the patient were reviewed by me and considered in my medical decision making (see chart for details).     22 year old female presents today with sore throat.  No suspicion for acute strep labs pending.  Final Clinical Impressions(s) / ED Diagnoses   Final diagnoses:  Pharyngitis, unspecified etiology    ED Discharge Orders    None       Rosalio LoudHedges, Randie Bloodgood, PA-C 01/25/18 1535    Rolan BuccoBelfi, Melanie, MD 01/25/18 1845

## 2018-01-25 NOTE — ED Triage Notes (Signed)
Pt in c/o sore throat for the last three days, vomit x1

## 2018-01-25 NOTE — ED Notes (Signed)
ED Provider at bedside. 

## 2018-01-25 NOTE — Discharge Instructions (Addendum)
Please read attached information. If you experience any new or worsening signs or symptoms please return to the emergency room for evaluation. Please follow-up with your primary care provider or specialist as discussed.  °

## 2018-01-26 NOTE — L&D Delivery Note (Signed)
Delivery Note  Recent admission 10/28-11/2) for pyelonephritis (Ecoli) GBS negative (urine sample) - PCR pending Dr Garwin Brothers recommend continue Keflex course x 6 week PP  First Stage: labor mode: spontaneous labor (contractions) onset: 0300 AROM at 0757 analgesia /anesthesia intrapartum: epidural  Second Stage: complete dilation at 0812 onset of pushing at 0813 FHR second stage category 1  delivery of a viable female at [phone] by CNM in ROA position Single loose nuchal cord reduced down body at birth  cord double clamped after cessation of pulsation, cut by FOB cord blood sample collected   Third Stage: placenta delivered [facility] intact with 3 VC @ 0820 placenta disposition: hospital disposal uterine tone firm / bleeding small complications: none  no laceration identified  estimated blood loss (mL): 150ml  mom to postpartum / baby to Couplet care / Skin to Skin.  Newborn: Apgar Scores: 1-minute: 9                           5-minute: 10  feeding plan: breastfeeding birth weight: pending  Artelia Laroche CNM, MSN, [facility] 12/05/2018, 8:26 AM

## 2018-10-28 ENCOUNTER — Other Ambulatory Visit (HOSPITAL_COMMUNITY): Payer: Self-pay | Admitting: Obstetrics and Gynecology

## 2018-10-28 ENCOUNTER — Other Ambulatory Visit: Payer: Self-pay | Admitting: Obstetrics and Gynecology

## 2018-10-28 DIAGNOSIS — R1084 Generalized abdominal pain: Secondary | ICD-10-CM

## 2018-10-28 DIAGNOSIS — O26899 Other specified pregnancy related conditions, unspecified trimester: Secondary | ICD-10-CM

## 2018-10-28 DIAGNOSIS — R110 Nausea: Secondary | ICD-10-CM

## 2018-10-31 ENCOUNTER — Other Ambulatory Visit: Payer: Self-pay | Admitting: Obstetrics and Gynecology

## 2018-10-31 DIAGNOSIS — R1013 Epigastric pain: Secondary | ICD-10-CM

## 2018-10-31 DIAGNOSIS — O26899 Other specified pregnancy related conditions, unspecified trimester: Secondary | ICD-10-CM

## 2018-11-01 ENCOUNTER — Other Ambulatory Visit: Payer: Self-pay

## 2018-11-01 ENCOUNTER — Ambulatory Visit (HOSPITAL_COMMUNITY)
Admission: RE | Admit: 2018-11-01 | Discharge: 2018-11-01 | Disposition: A | Discharge status: Home / Self Care | Payer: PRIVATE HEALTH INSURANCE | Source: Ambulatory Visit | Attending: Obstetrics and Gynecology | Admitting: Obstetrics and Gynecology

## 2018-11-01 ENCOUNTER — Ambulatory Visit (HOSPITAL_COMMUNITY): Payer: Medicaid Other

## 2018-11-01 DIAGNOSIS — O26899 Other specified pregnancy related conditions, unspecified trimester: Secondary | ICD-10-CM | POA: Diagnosis present

## 2018-11-01 DIAGNOSIS — R1084 Generalized abdominal pain: Secondary | ICD-10-CM | POA: Diagnosis present

## 2018-11-01 DIAGNOSIS — R110 Nausea: Secondary | ICD-10-CM

## 2018-11-04 ENCOUNTER — Other Ambulatory Visit: Payer: Medicaid Other

## 2018-11-23 ENCOUNTER — Other Ambulatory Visit: Payer: Self-pay

## 2018-11-23 ENCOUNTER — Encounter (HOSPITAL_COMMUNITY): Payer: Self-pay | Admitting: *Deleted

## 2018-11-23 ENCOUNTER — Inpatient Hospital Stay (HOSPITAL_COMMUNITY)
Admission: AD | Admit: 2018-11-23 | Discharge: 2018-11-28 | DRG: 831 | Disposition: A | Discharge status: Home / Self Care | Payer: No Typology Code available for payment source | Attending: Obstetrics | Admitting: Obstetrics

## 2018-11-23 DIAGNOSIS — O2343 Unspecified infection of urinary tract in pregnancy, third trimester: Secondary | ICD-10-CM | POA: Diagnosis present

## 2018-11-23 DIAGNOSIS — N1330 Unspecified hydronephrosis: Secondary | ICD-10-CM | POA: Diagnosis present

## 2018-11-23 DIAGNOSIS — O368330 Maternal care for abnormalities of the fetal heart rate or rhythm, third trimester, not applicable or unspecified: Secondary | ICD-10-CM | POA: Diagnosis present

## 2018-11-23 DIAGNOSIS — O99013 Anemia complicating pregnancy, third trimester: Secondary | ICD-10-CM | POA: Diagnosis present

## 2018-11-23 DIAGNOSIS — R109 Unspecified abdominal pain: Secondary | ICD-10-CM | POA: Diagnosis present

## 2018-11-23 DIAGNOSIS — A419 Sepsis, unspecified organism: Principal | ICD-10-CM | POA: Diagnosis present

## 2018-11-23 DIAGNOSIS — R6521 Severe sepsis with septic shock: Secondary | ICD-10-CM | POA: Diagnosis present

## 2018-11-23 DIAGNOSIS — R000 Tachycardia, unspecified: Secondary | ICD-10-CM | POA: Diagnosis present

## 2018-11-23 DIAGNOSIS — O34219 Maternal care for unspecified type scar from previous cesarean delivery: Secondary | ICD-10-CM | POA: Diagnosis present

## 2018-11-23 DIAGNOSIS — D509 Iron deficiency anemia, unspecified: Secondary | ICD-10-CM | POA: Diagnosis present

## 2018-11-23 DIAGNOSIS — R52 Pain, unspecified: Secondary | ICD-10-CM | POA: Diagnosis not present

## 2018-11-23 DIAGNOSIS — A4151 Sepsis due to Escherichia coli [E. coli]: Secondary | ICD-10-CM | POA: Diagnosis present

## 2018-11-23 DIAGNOSIS — B9620 Unspecified Escherichia coli [E. coli] as the cause of diseases classified elsewhere: Secondary | ICD-10-CM | POA: Diagnosis present

## 2018-11-23 DIAGNOSIS — R0789 Other chest pain: Secondary | ICD-10-CM | POA: Diagnosis present

## 2018-11-23 DIAGNOSIS — Z3A35 35 weeks gestation of pregnancy: Secondary | ICD-10-CM | POA: Diagnosis not present

## 2018-11-23 DIAGNOSIS — O99891 Other specified diseases and conditions complicating pregnancy: Secondary | ICD-10-CM | POA: Diagnosis present

## 2018-11-23 DIAGNOSIS — Z20828 Contact with and (suspected) exposure to other viral communicable diseases: Secondary | ICD-10-CM | POA: Diagnosis present

## 2018-11-23 DIAGNOSIS — D649 Anemia, unspecified: Secondary | ICD-10-CM | POA: Diagnosis not present

## 2018-11-23 DIAGNOSIS — O411230 Chorioamnionitis, third trimester, not applicable or unspecified: Secondary | ICD-10-CM | POA: Diagnosis present

## 2018-11-23 DIAGNOSIS — O26893 Other specified pregnancy related conditions, third trimester: Secondary | ICD-10-CM | POA: Diagnosis present

## 2018-11-23 DIAGNOSIS — O98813 Other maternal infectious and parasitic diseases complicating pregnancy, third trimester: Principal | ICD-10-CM | POA: Diagnosis present

## 2018-11-23 DIAGNOSIS — O2303 Infections of kidney in pregnancy, third trimester: Secondary | ICD-10-CM | POA: Diagnosis present

## 2018-11-23 DIAGNOSIS — O2300 Infections of kidney in pregnancy, unspecified trimester: Secondary | ICD-10-CM

## 2018-11-23 DIAGNOSIS — M7989 Other specified soft tissue disorders: Secondary | ICD-10-CM | POA: Diagnosis not present

## 2018-11-23 LAB — COMPREHENSIVE METABOLIC PANEL
ALT: 10 U/L (ref 0–44)
AST: 15 U/L (ref 15–41)
Albumin: 2.6 g/dL — ABNORMAL LOW (ref 3.5–5.0)
Alkaline Phosphatase: 142 U/L — ABNORMAL HIGH (ref 38–126)
Anion gap: 10 (ref 5–15)
BUN: 5 mg/dL — ABNORMAL LOW (ref 6–20)
CO2: 17 mmol/L — ABNORMAL LOW (ref 22–32)
Calcium: 8.4 mg/dL — ABNORMAL LOW (ref 8.9–10.3)
Chloride: 104 mmol/L (ref 98–111)
Creatinine, Ser: 0.53 mg/dL (ref 0.44–1.00)
GFR calc Af Amer: >60 mL/min (ref >60)
GFR calc non Af Amer: >60 mL/min (ref >60)
Glucose, Bld: 83 mg/dL (ref 70–99)
Potassium: 3.5 mmol/L (ref 3.5–5.1)
Sodium: 131 mmol/L — ABNORMAL LOW (ref 135–145)
Total Bilirubin: 1.1 mg/dL (ref 0.3–1.2)
Total Protein: 6.9 g/dL (ref 6.5–8.1)

## 2018-11-23 LAB — URINALYSIS, ROUTINE W REFLEX MICROSCOPIC
Bilirubin Urine: NEGATIVE
Glucose, UA: NEGATIVE mg/dL
Ketones, ur: 5 mg/dL — AB
Nitrite: POSITIVE — AB
Protein, ur: NEGATIVE mg/dL
Specific Gravity, Urine: 1.003 — ABNORMAL LOW (ref 1.005–1.030)
WBC, UA: >50 WBC/hpf — ABNORMAL HIGH (ref 0–5)
pH: 6 (ref 5.0–8.0)

## 2018-11-23 LAB — CBC
HCT: 33.4 % — ABNORMAL LOW (ref 36.0–46.0)
Hemoglobin: 10.6 g/dL — ABNORMAL LOW (ref 12.0–15.0)
MCH: 26.6 pg (ref 26.0–34.0)
MCHC: 31.7 g/dL (ref 30.0–36.0)
MCV: 83.9 fL (ref 80.0–100.0)
Platelets: 220 10*3/uL (ref 150–400)
RBC: 3.98 MIL/uL (ref 3.87–5.11)
RDW: 15.3 % (ref 11.5–15.5)
WBC: 20.1 10*3/uL — ABNORMAL HIGH (ref 4.0–10.5)
nRBC: 0 % (ref 0.0–0.2)

## 2018-11-23 LAB — ABO/RH: ABO/RH(D): O POS

## 2018-11-23 LAB — TYPE AND SCREEN
ABO/RH(D): O POS
Antibody Screen: NEGATIVE

## 2018-11-23 LAB — SARS CORONAVIRUS 2 (TAT 6-24 HRS): SARS Coronavirus 2: NEGATIVE

## 2018-11-23 MED ORDER — PRENATAL MULTIVITAMIN CH
1.0000 | ORAL_TABLET | Freq: Every day | ORAL | Status: DC
  Administered 2018-11-24 – 2018-11-28 (×5): 1 via ORAL
  Filled 2018-11-23 (×5): qty 1

## 2018-11-23 MED ORDER — DOCUSATE SODIUM 100 MG PO CAPS
100.0000 mg | ORAL_CAPSULE | Freq: Every day | ORAL | Status: DC
  Administered 2018-11-25 – 2018-11-28 (×4): 100 mg via ORAL
  Filled 2018-11-23 (×4): qty 1

## 2018-11-23 MED ORDER — SODIUM CHLORIDE 0.9 % IV SOLN
2.0000 g | INTRAVENOUS | Status: AC
  Administered 2018-11-23 – 2018-11-27 (×5): 2 g via INTRAVENOUS
  Filled 2018-11-23: qty 20
  Filled 2018-11-23 (×2): qty 2
  Filled 2018-11-23: qty 20
  Filled 2018-11-23: qty 2
  Filled 2018-11-23: qty 20

## 2018-11-23 MED ORDER — PHENAZOPYRIDINE HCL 100 MG PO TABS
200.0000 mg | ORAL_TABLET | Freq: Once | ORAL | Status: AC
  Administered 2018-11-23: 200 mg via ORAL
  Filled 2018-11-23: qty 2

## 2018-11-23 MED ORDER — ACETAMINOPHEN 325 MG PO TABS
650.0000 mg | ORAL_TABLET | ORAL | Status: DC | PRN
  Administered 2018-11-24 – 2018-11-28 (×11): 650 mg via ORAL
  Filled 2018-11-23 (×12): qty 2

## 2018-11-23 MED ORDER — SODIUM CHLORIDE 0.9 % IV SOLN
INTRAVENOUS | Status: DC
  Administered 2018-11-23 – 2018-11-24 (×3): via INTRAVENOUS

## 2018-11-23 MED ORDER — CALCIUM CARBONATE ANTACID 500 MG PO CHEW
2.0000 | CHEWABLE_TABLET | ORAL | Status: DC | PRN
  Administered 2018-11-24: 400 mg via ORAL
  Filled 2018-11-23 (×2): qty 2

## 2018-11-23 MED ORDER — ZOLPIDEM TARTRATE 5 MG PO TABS: 5.0000 mg | ORAL_TABLET | Freq: Every evening | ORAL | Status: DC | PRN

## 2018-11-23 MED ORDER — ACETAMINOPHEN 325 MG PO TABS
650.0000 mg | ORAL_TABLET | Freq: Once | ORAL | Status: AC
  Administered 2018-11-23: 650 mg via ORAL
  Filled 2018-11-23: qty 2

## 2018-11-23 NOTE — MAU Note (Signed)
Been running a fever for 3 days, the last time she checked it was 102(yesterday, did not check today). Having headaches, muscle aches and nausea. Experiencing loss of taste and sense of smell. Called dr, was told to come here. Having UTI symptoms again, frequency, urgency and pain with urination- started about 3 days ago also.  Denies abd pain, vag bleeding or leaking, reports +fm.

## 2018-11-23 NOTE — H&P (Signed)
Chief complaint: Abdominal pain, fever, chills  History of present illness: 23 year old G4 P3-0-0-3 at 35 weeks and 5 days by LMP dating presents with 3 days of fever and chills.  Patient states no cough, no diarrhea but occasional shortness of breath.  Patient states no chest pain.  Patient notes loss of sense of taste and smell.  Patient reports slight increase in [facility] contractions.  No vaginal bleeding, no leakage of fluid.  Normal fetal movement.  Patient notes upper abdominal pain that she has had for the last several weeks.  Patient reports painful urination over the past several days  Prenatal issues: -Dated by last menstrual period -Late to care.  Started care at 20 weeks -Declined genetic screening -C-section for CPD then VBAC x2.  Planning another VBAC -Anemia.  On iron -UTI at 23 weeks.  Pansensitive E. coli.  Test of cure was negative.  No further urine cultures were done -Abdominal pain for the last 4 weeks.  Patient with normal CMP and normal lipase.  Known hernia but no evidence of incarceration or worsening.    Prenatal labs.  Sickle cell screen negative, diabetic screen 86, blood type O+.  Past Medical History:  Diagnosis Date  . Anemia   . Hx of varicella     Past Surgical History:  Procedure Laterality Date  . CESAREAN SECTION N/A 01/11/2015   Procedure: CESAREAN SECTION;  Surgeon: Allyn Kenner, DO;  Location: [facility];  Service: Obstetrics;  Laterality: N/A;  . MOUTH SURGERY  2011  . WISDOM TOOTH EXTRACTION      No known drug allergies Meds: Prenatal vitamins, iron  Physical exam: General: Uncomfortable, no distress CV: Regular rate and rhythm Pulmonary clear to auscultation per CNM Back: Mildly tender bilaterally, unclear if this is true costovertebral angle tenderness Abdomen: Mild fundal tenderness per her usual per patient, no right upper quadrant pain, no lateral fundal pain, no rebound, no guarding, no distention GU: Cervix 1 cm, 20% effaced,  soft, mid position, vertex palpated, membranes intact.  No cervical motion tenderness.  No lower segment tenderness.  Bladder tenderness present  Toco: No contractions FH: 140s, positive accelerations no decelerations, 10 beat variability  CBC    Component Value Date/Time   WBC 20.1 (H) 11/23/2018 1813   RBC 3.98 11/23/2018 1813   HGB 10.6 (L) 11/23/2018 1813   HCT 33.4 (L) 11/23/2018 1813   PLT 220 11/23/2018 1813   MCV 83.9 11/23/2018 1813   MCH 26.6 11/23/2018 1813   MCHC 31.7 11/23/2018 1813   RDW 15.3 11/23/2018 1813   LYMPHSABS 1.7 02/06/2016 1011   MONOABS 0.5 02/06/2016 1011   EOSABS 0.1 02/06/2016 1011   BASOSABS 0.0 02/06/2016 1011   Urinalysis    Component Value Date/Time   COLORURINE YELLOW 11/23/2018 1737   APPEARANCEUR CLOUDY (A) 11/23/2018 1737   LABSPEC 1.003 (L) 11/23/2018 1737   PHURINE 6.0 11/23/2018 1737   GLUCOSEU NEGATIVE 11/23/2018 1737   HGBUR SMALL (A) 11/23/2018 1737   BILIRUBINUR NEGATIVE 11/23/2018 1737   KETONESUR 5 (A) 11/23/2018 1737   PROTEINUR NEGATIVE 11/23/2018 1737   NITRITE POSITIVE (A) 11/23/2018 1737   LEUKOCYTESUR LARGE (A) 11/23/2018 1737    Assessment plan: 23 year old G4, P3 at 35 weeks and 5 days with fever chills, dysuria, leukocytosis and abnormal urinalysis most consistent with pyelonephritis.  While costovertebral vertebral angle tenderness is not overly convincing patient most likely has developing pyelonephritis given her systemic symptoms.  Patient did have E. coli UTI earlier in the pregnancy.  She has been complaining about abdominal pain over the past several weeks without repeat urine culture.  She does not appear to be in labor.  She does have some symptoms that could be consistent with coronavirus and that test is in progress.  Will admit for IV fluids and IV antibiotics.  Will plan intermittent monitoring.  While chorioamnionitis is in the differential diagnosis given her fundal tenderness this appears to be a more  chronic pain during her pregnancy.  Furthermore no fetal tachycardia or contractions are noted.  She is not ruptured.  Umbilical hernia known but reduces and without pain on exam.  Lendon Colonel 11/23/2018 7:43 PM

## 2018-11-23 NOTE — MAU Provider Note (Signed)
Patient Courtney Colon is a 23 y.o. [phone] At [redacted]w[redacted]d here with complaints of difficulty urinating, feeling that she has a urinary tract infection. She states that it occurs after she is napping and then she gets a full bladder. The urination after her nap is painful, but the pain resolves immediately upon voiding.   She denies vaginal bleeding, vaginal discharge, decreased fetal movement, contractions.  She reports some pressure like in her chest, muscle aches, loss and taste and smell.   She denies any sick contacts. 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   She reports that she is anemic, but otherwise has no complications in this pregnancy.  History     CSN: [id number]  Arrival date and time: 11/23/18 1658   First Provider Initiated Contact with Patient 11/23/18 1754      No chief complaint on file.  Fever  This is a new problem. The current episode started in the past 7 days. The problem occurs constantly. The problem has been unchanged. Associated symptoms include headaches, muscle aches, urinary pain and vomiting. Pertinent negatives include no chest pain, congestion, coughing, diarrhea or sore throat.  Dysuria  This is a new problem. The current episode started in the past 7 days. The problem occurs intermittently. The pain is at a severity of 5/10. The maximum temperature recorded prior to her arrival was 102 - 102.9 F. Associated symptoms include chills and vomiting.    OB History    Gravida  4   Para  3   Term  3   Preterm      AB      Living  3     SAB      TAB      Ectopic      Multiple  0   Live Births  3           Past Medical History:  Diagnosis Date  . Anemia   . Hx of varicella     Past Surgical History:  Procedure Laterality Date  . CESAREAN SECTION N/A 01/11/2015   Procedure: CESAREAN SECTION;  Surgeon: Philip Aspen, DO;  Location: [facility];  Service: Obstetrics;  Laterality: N/A;  . MOUTH SURGERY  2011  . WISDOM TOOTH  EXTRACTION      Family History  Problem Relation Age of Onset  . Hypertension Mother     Social History   Tobacco Use  . Smoking status: Never Smoker  . Smokeless tobacco: Never Used  Substance Use Topics  . Alcohol use: No  . Drug use: No    Allergies: No Known Allergies  Medications Prior to Admission  Medication Sig Dispense Refill Last Dose  . acetaminophen (TYLENOL) 325 MG tablet Take 2 tablets (650 mg total) by mouth every 4 (four) hours as needed (for pain scale < 4).   11/23/2018 at Unknown time  . iron polysaccharides (NIFEREX) 150 MG capsule Take 1 capsule (150 mg total) by mouth daily.   Past Week at Unknown time  . Prenatal Vit-Fe Fumarate-FA (PRENATAL MULTIVITAMIN) TABS tablet Take 1 tablet by mouth daily at 12 noon.   Past Week at Unknown time  . benzocaine-Menthol (DERMOPLAST) 20-0.5 % AERO Apply 1 application topically as needed for irritation (perineal discomfort).     . coconut oil OIL Apply 1 application topically as needed.  0   . ibuprofen (ADVIL,MOTRIN) 800 MG tablet Take 1 tablet (800 mg total) by mouth every 6 (six) hours. 30 tablet 0   .  magnesium oxide (MAG-OX) 400 (241.3 Mg) MG tablet Take 1 tablet (400 mg total) by mouth daily.       Review of Systems  Constitutional: Positive for chills and fever.  HENT: Negative for congestion and sore throat.   Respiratory: Negative for cough.   Cardiovascular: Negative for chest pain.  Gastrointestinal: Positive for vomiting. Negative for diarrhea.  Genitourinary: Positive for dysuria.  Neurological: Positive for headaches.   Physical Exam   Blood pressure 116/73, pulse (!) 119, temperature 98 F (36.7 C), resp. rate 17, weight 69.2 kg, SpO2 100 %, unknown if currently breastfeeding.  Physical Exam  Constitutional: She is oriented to person, place, and time. She appears well-developed.  HENT:  Head: Normocephalic.  Neck: Normal range of motion.  Cardiovascular: Normal rate.  Respiratory: Effort  normal. No respiratory distress. She has no wheezes. She has no rales. She exhibits no tenderness.  GI: Soft.  Musculoskeletal: Normal range of motion.  Neurological: She is alert and oriented to person, place, and time.  Skin: Skin is warm and dry.    MAU Course  Procedures  MDM NST: 125 bpm, mod var, present acel, neg decels, no contractions.  -Covid test pending. -will draw CBC and CMP. -No fever in MAU but patient took Tylenol at 2 pm.  Assessment and Plan  -Patient to be admitted to [facility] with Dr. Valentino Saxon at the bedside.   Mervyn Skeeters Kooistra 11/23/2018, 6:16 PM

## 2018-11-24 ENCOUNTER — Inpatient Hospital Stay (HOSPITAL_COMMUNITY): Payer: No Typology Code available for payment source

## 2018-11-24 DIAGNOSIS — O2303 Infections of kidney in pregnancy, third trimester: Secondary | ICD-10-CM

## 2018-11-24 DIAGNOSIS — A419 Sepsis, unspecified organism: Secondary | ICD-10-CM | POA: Diagnosis not present

## 2018-11-24 LAB — URINALYSIS, ROUTINE W REFLEX MICROSCOPIC
Bilirubin Urine: NEGATIVE
Glucose, UA: NEGATIVE mg/dL
Ketones, ur: 20 mg/dL — AB
Nitrite: POSITIVE — AB
Protein, ur: 30 mg/dL — AB
Specific Gravity, Urine: 1.014 (ref 1.005–1.030)
WBC, UA: >50 WBC/hpf — ABNORMAL HIGH (ref 0–5)
pH: 6 (ref 5.0–8.0)

## 2018-11-24 LAB — PROTIME-INR
INR: 1.1 (ref 0.8–1.2)
Prothrombin Time: 14.1 seconds (ref 11.4–15.2)

## 2018-11-24 LAB — CBC WITH DIFFERENTIAL/PLATELET
Abs Immature Granulocytes: 0.13 10*3/uL — ABNORMAL HIGH (ref 0.00–0.07)
Basophils Absolute: 0 10*3/uL (ref 0.0–0.1)
Basophils Relative: 0 %
Eosinophils Absolute: 0 10*3/uL (ref 0.0–0.5)
Eosinophils Relative: 0 %
HCT: 28.6 % — ABNORMAL LOW (ref 36.0–46.0)
Hemoglobin: 9.1 g/dL — ABNORMAL LOW (ref 12.0–15.0)
Immature Granulocytes: 1 %
Lymphocytes Relative: 6 %
Lymphs Abs: 0.8 10*3/uL (ref 0.7–4.0)
MCH: 26.8 pg (ref 26.0–34.0)
MCHC: 31.8 g/dL (ref 30.0–36.0)
MCV: 84.1 fL (ref 80.0–100.0)
Monocytes Absolute: 1.7 10*3/uL — ABNORMAL HIGH (ref 0.1–1.0)
Monocytes Relative: 12 %
Neutro Abs: 12.1 10*3/uL — ABNORMAL HIGH (ref 1.7–7.7)
Neutrophils Relative %: 81 %
Platelets: 177 10*3/uL (ref 150–400)
RBC: 3.4 MIL/uL — ABNORMAL LOW (ref 3.87–5.11)
RDW: 15.6 % — ABNORMAL HIGH (ref 11.5–15.5)
WBC: 14.8 10*3/uL — ABNORMAL HIGH (ref 4.0–10.5)
nRBC: 0 % (ref 0.0–0.2)

## 2018-11-24 LAB — COMPREHENSIVE METABOLIC PANEL
ALT: 9 U/L (ref 0–44)
AST: 16 U/L (ref 15–41)
Albumin: 2.2 g/dL — ABNORMAL LOW (ref 3.5–5.0)
Alkaline Phosphatase: 124 U/L (ref 38–126)
Anion gap: 11 (ref 5–15)
BUN: <5 mg/dL — ABNORMAL LOW (ref 6–20)
CO2: 16 mmol/L — ABNORMAL LOW (ref 22–32)
Calcium: 7.8 mg/dL — ABNORMAL LOW (ref 8.9–10.3)
Chloride: 105 mmol/L (ref 98–111)
Creatinine, Ser: 0.56 mg/dL (ref 0.44–1.00)
GFR calc Af Amer: >60 mL/min (ref >60)
GFR calc non Af Amer: >60 mL/min (ref >60)
Glucose, Bld: 94 mg/dL (ref 70–99)
Potassium: 3.1 mmol/L — ABNORMAL LOW (ref 3.5–5.1)
Sodium: 132 mmol/L — ABNORMAL LOW (ref 135–145)
Total Bilirubin: 0.7 mg/dL (ref 0.3–1.2)
Total Protein: 6 g/dL — ABNORMAL LOW (ref 6.5–8.1)

## 2018-11-24 LAB — MRSA PCR SCREENING: MRSA by PCR: NEGATIVE

## 2018-11-24 LAB — LACTIC ACID, PLASMA
Lactic Acid, Venous: 1.7 mmol/L (ref 0.5–1.9)
Lactic Acid, Venous: 1.2 mmol/L (ref 0.5–1.9)
Lactic Acid, Venous: 1 mmol/L (ref 0.5–1.9)

## 2018-11-24 LAB — APTT: aPTT: 35 seconds (ref 24–36)

## 2018-11-24 LAB — GLUCOSE, CAPILLARY: Glucose-Capillary: 74 mg/dL (ref 70–99)

## 2018-11-24 LAB — FIBRINOGEN: Fibrinogen: 559 mg/dL — ABNORMAL HIGH (ref 210–475)

## 2018-11-24 MED ORDER — FAMOTIDINE IN NACL 20-0.9 MG/50ML-% IV SOLN
20.0000 mg | Freq: Once | INTRAVENOUS | Status: AC
  Administered 2018-11-24: 20 mg via INTRAVENOUS
  Filled 2018-11-24: qty 50

## 2018-11-24 MED ORDER — LACTATED RINGERS IV SOLN
INTRAVENOUS | Status: DC
  Administered 2018-11-24 – 2018-11-27 (×9): via INTRAVENOUS

## 2018-11-24 MED ORDER — SODIUM CHLORIDE 0.9 % IV BOLUS (SEPSIS)
250.0000 mL | Freq: Once | INTRAVENOUS | Status: AC
  Administered 2018-11-24: 250 mL via INTRAVENOUS

## 2018-11-24 MED ORDER — SODIUM CHLORIDE 0.9 % IV BOLUS (SEPSIS)
1000.0000 mL | Freq: Once | INTRAVENOUS | Status: AC
  Administered 2018-11-24: 1000 mL via INTRAVENOUS

## 2018-11-24 MED ORDER — PHENYLEPHRINE HCL-NACL 10-0.9 MG/250ML-% IV SOLN: 25.0000 ug/min | INTRAVENOUS | Status: DC

## 2018-11-24 MED ORDER — SODIUM CHLORIDE 0.9 % IV SOLN: 250.0000 mL | INTRAVENOUS | Status: DC

## 2018-11-24 MED ORDER — SODIUM CHLORIDE 0.9 % IV BOLUS
1000.0000 mL | Freq: Once | INTRAVENOUS | Status: AC
  Administered 2018-11-24: 1000 mL via INTRAVENOUS

## 2018-11-24 MED ORDER — FAMOTIDINE IN NACL 20-0.9 MG/50ML-% IV SOLN
20.0000 mg | INTRAVENOUS | Status: DC
  Administered 2018-11-24 – 2018-11-26 (×3): 20 mg via INTRAVENOUS
  Filled 2018-11-24 (×3): qty 50

## 2018-11-24 MED ORDER — POTASSIUM CHLORIDE CRYS ER 20 MEQ PO TBCR
40.0000 meq | EXTENDED_RELEASE_TABLET | ORAL | Status: AC
  Administered 2018-11-24: 40 meq via ORAL
  Filled 2018-11-24 (×2): qty 2

## 2018-11-24 NOTE — Significant Event (Addendum)
Rapid Response Event Note  Overview: Follow-Up Sepsis   Initial Focused Assessment: I came by to see [name] at 43. Patient was awake, she appeared somewhat uncomfortable, skin very warm to touch, + rigors/chills, she endorsing that she has been shaking and feels cold. HR 120-125 ST, + 2 pulses, clear lung sounds, BP 86/51 - these monitors do not read MAP pressures, but calculated it is 62. No respiratory distress or issues, she just appears fatigued and uncomfortable.   Interventions: -- 3rd L NS infusing now and 4th L NS ordered -- total after this will 4250 cc of fluid resuscitation.  -- Repeat LA  Plan of Care: -- I am concerned overall given that the despite IVF resuscitation, BPs have not improved and patient is still exhibiting signs of fever (rigor/chills). I spoke with the PCCM team in person and updated them, PCCM team will come see patient -- Monitor VS closely -- Strict I/Os  Event Summary:  Start Time 0745  End Time 0815   Courtney Colon, [place]

## 2018-11-24 NOTE — Significant Event (Signed)
Rapid Response Event Note  Overview:  Called about pt [redacted]wk pregnant admitted with pyelonephritis. Code Sepsis initiated for hypotension, tachycardia and febrile.     Initial Focused Assessment: On arrival, pt laying in bed c/o chest pain described as burning from chest to throat. Pt diaphoretic, temp 99.1, HR 154, BP 123/69, spO2 96% on RA.   Interventions: Bolus per sepsis protocol Labs- LA, Blood cultures EKG- ST OB Md aware and at bedside- cervical exam as well as I&O cath done.  CCM consulted Remote pharmacy notified of sepsis activation  Plan of Care (if not transferred): Will continue to monitor pt status. BP seems to be improved at the moment, continue to monitor for hypotension after fluids completed. Will keep pt on 1S for now unless need for pressors or status worsens. Awaiting LA results and possible need for abx change. RN instructed to call with any changes or concerns. Will follow up  Event Summary:  Called at 0441      Event ended at  0548  0605 RN called d/t BP drop to 83/46. Fluids still infusing and pt not symptomatic. Instructed to wait until fluids completed or if pt become symptomatic with hypotension. Continue to monitor vitals closely.         Sherilyn Dacosta

## 2018-11-24 NOTE — Consult Note (Signed)
NAME:  Courtney Colon, MRN:  [id number], DOB:  Feb 21, 1995, LOS: 1 ADMISSION DATE:  11/23/2018, CONSULTATION DATE:  11/24/18 REFERRING MD: Dr. Ernestina Penna , CHIEF COMPLAINT:  Code Sepsis  Brief History   23 year old female currently [redacted] weeks pregnant who presented to the emergency department yesterday with fever and abdominal pain.  UA consistent with UTI, she was admitted by OB and started on ceftriaxone, not in active labor.  Overnight, patient became increasingly tachycardic and blood pressure dropped to 79/57.  A code sepsis was called and PCCM consulted.  History of present illness   23 year old G30, P3 female [redacted] weeks pregnant who had been experiencing intermittent abdominal pain for several weeks who presented with 3 days of fever and chills.  She had a pansensitive E.  Coli UTI earlier in her pregnancy.  UA with leukocytes and nitrite positive, no nausea vomiting or CVA tenderness.  She had a WBC of 20k, normal renal function.  She was started on ceftriaxone and 125 cc/hour normal saline.   Past Medical History   has a past medical history of Anemia and varicella.  Significant Hospital Events   10/28 Admit to [facility] 10/29 Code Sepsis  Consults:  PCCM  Procedures:    Significant Diagnostic Tests:    Micro Data:  BCx2>> Sars-Cov-2>>neg   Antimicrobials:  Ceftriaxone 10/28-  Interim history/subjective:  Code sepsis called and sepsis protocol IVF started 2,225L.  BP improved quickly, HR 130-150.   Objective   Blood pressure (!) 96/43, pulse (!) 129, temperature 98.2 F (36.8 C), temperature source Oral, resp. rate 16, weight 69.2 kg, SpO2 98 %, unknown if currently breastfeeding.       No intake or output data in the 24 hours ending 11/24/18 0555 Filed Weights   11/23/18 1712  Weight: 69.2 kg   General:  Pregnant F, awake and alert and in no distress HEENT: MM pink/moist Neuro: awake and alert, no focal deficits CV: s1s2 tachycardic, regular rate and rhythm, no m/r/g PULM:   CTAB, on room air  GI: soft, bsx4 active, 35 week fundus, non-tender, no CVA TTP  Extremities: warm/dry, no edema  Skin: no rashes or lesions   Resolved Hospital Problem list   Abdominal pain  Assessment & Plan:   Severe Sepsis likely secondary to UTI -Lactic acid pending, WBC down-trending, normal renal function and mentating well P: -Continue IVF boluses, expect her hypotension and tachycardia will improve with increased volume resuscitation -consider broadening coverage, had Enterococcus UTI last year, for now would continue Ceftriaxone, follow lactic and cultures and monitor for worsening sepsis physiology  -if BP down-trends despite IVF, patient becomes altered or volume overloaded,  may need ICU txfr for pressors and closer monitoring.  -For now, continue current treatment plan and please notify the ICU team for any worsening of clinical status      Labs   CBC: Recent Labs  Lab 11/23/18 1813 11/24/18 0448  WBC 20.1* 14.8*  NEUTROABS  --  12.1*  HGB 10.6* 9.1*  HCT 33.4* 28.6*  MCV 83.9 84.1  PLT 220 177    Basic Metabolic Panel: Recent Labs  Lab 11/23/18 1813  NA 131*  K 3.5  CL 104  CO2 17*  GLUCOSE 83  BUN 5*  CREATININE 0.53  CALCIUM 8.4*   GFR: CrCl cannot be calculated (Unknown ideal weight.). Recent Labs  Lab 11/23/18 1813 11/24/18 0448  WBC 20.1* 14.8*    Liver Function Tests: Recent Labs  Lab 11/23/18 1813  AST 15  ALT  10  ALKPHOS 142*  BILITOT 1.1  PROT 6.9  ALBUMIN 2.6*   No results for input(s): LIPASE, AMYLASE in the last 168 hours. No results for input(s): AMMONIA in the last 168 hours.  ABG No results found for: PHART, PCO2ART, PO2ART, HCO3, TCO2, ACIDBASEDEF, O2SAT   Coagulation Profile: No results for input(s): INR, PROTIME in the last 168 hours.  Cardiac Enzymes: No results for input(s): CKTOTAL, CKMB, CKMBINDEX, TROPONINI in the last 168 hours.  HbA1C: No results found for: HGBA1C  CBG: No results for  input(s): GLUCAP in the last 168 hours.  Review of Systems:   Negative except as noted in HPI  Past Medical History  She,  has a past medical history of Anemia and varicella.   Surgical History    Past Surgical History:  Procedure Laterality Date  . CESAREAN SECTION N/A 01/11/2015   Procedure: CESAREAN SECTION;  Surgeon: Allyn Kenner, DO;  Location: [facility];  Service: Obstetrics;  Laterality: N/A;  . MOUTH SURGERY  2011  . WISDOM TOOTH EXTRACTION       Social History   reports that she has never smoked. She has never used smokeless tobacco. She reports that she does not drink alcohol or use drugs.   Family History   Her family history includes Hypertension in her mother.   Allergies No Known Allergies   Home Medications  Prior to Admission medications   Medication Sig Start Date End Date Taking? Authorizing Provider  acetaminophen (TYLENOL) 325 MG tablet Take 2 tablets (650 mg total) by mouth every 4 (four) hours as needed (for pain scale < 4). 07/29/17  Yes [name], CNM  iron polysaccharides (NIFEREX) 150 MG capsule Take 1 capsule (150 mg total) by mouth daily. 07/29/17  Yes [name], CNM  Prenatal Vit-Fe Fumarate-FA (PRENATAL MULTIVITAMIN) TABS tablet Take 1 tablet by mouth daily at 12 noon.   Yes [provider]  benzocaine-Menthol (DERMOPLAST) 20-0.5 % AERO Apply 1 application topically as needed for irritation (perineal discomfort). 07/29/17   [name], CNM  coconut oil OIL Apply 1 application topically as needed. 07/29/17   [name], CNM  ibuprofen (ADVIL,MOTRIN) 800 MG tablet Take 1 tablet (800 mg total) by mouth every 6 (six) hours. 07/29/17   [name], CNM  magnesium oxide (MAG-OX) 400 (241.3 Mg) MG tablet Take 1 tablet (400 mg total) by mouth daily. 07/29/17   [name], CNM     Critical care time: 35 minutes     CRITICAL CARE Performed by: Otilio Carpen Salisa Broz   Total critical care time: 35 minutes  Critical care time was  exclusive of separately billable procedures and treating other patients.  Critical care was necessary to treat or prevent imminent or life-threatening deterioration.  Critical care was time spent personally by me on the following activities: development of treatment plan with patient and/or surrogate as well as nursing, discussions with consultants, evaluation of patient's response to treatment, examination of patient, obtaining history from patient or surrogate, ordering and performing treatments and interventions, ordering and review of laboratory studies, ordering and review of radiographic studies, pulse oximetry and re-evaluation of patient's condition.  Otilio Carpen Elden Brucato, PA-C [facility]  Pager# [phone], if no answer [phone]

## 2018-11-24 NOTE — Progress Notes (Addendum)
Relief of nurse A. Pugh at this time. Pt. In 2M07, pt. Attached to fetal monitoring. Digital reading on Philips monitor showed FHR in the 130s to 140s. Pt's HR in the 120s. Pt. In room capable of FHR monitoring. Call to IT and ROB to figure out how to have strip display on screen.  Patient reports good fetal movement. Avalon FM50 monitor displays FHR as varying between 135 to 140.

## 2018-11-24 NOTE — Consult Note (Addendum)
Consultation: Right hydronephrosis, sepsis Requested by: NP Zenia Resides  History of Present Illness: Courtney Colon is a 23 year old female who is [redacted] weeks pregnant.  She developed fevers chills and right flank pain a few days ago.  She developed dysuria.  UA with many bacteria, positive leukocyte esterase, positive nitrite.  White count 20, creatinine 0.5.  She was hypotensive and required aggressive fluid resuscitation.  She has responded but due to her illness she underwent renal ultrasound.  This showed mild right hydronephrosis with some dilation of the renal pelvis and proximal ureter but no findings consistent with a stone.  The hydronephrosis was thought to be a little worse compared to a prior ultrasound per the report.  I reviewed the images of the current ultrasound.  The patient denies any history of kidney stones.  She is voiding without difficulty and the dysuria has improved.  She reports she had some bacteriuria and may be a UTI earlier this month.  Past Medical History:  Diagnosis Date  . Anemia   . Hx of varicella    Past Surgical History:  Procedure Laterality Date  . CESAREAN SECTION N/A 01/11/2015   Procedure: CESAREAN SECTION;  Surgeon: Philip Aspen, DO;  Location: [facility];  Service: Obstetrics;  Laterality: N/A;  . MOUTH SURGERY  2011  . WISDOM TOOTH EXTRACTION      Home Medications:  Medications Prior to Admission  Medication Sig Dispense Refill Last Dose  . acetaminophen (TYLENOL) 325 MG tablet Take 2 tablets (650 mg total) by mouth every 4 (four) hours as needed (for pain scale < 4).   11/23/2018 at Unknown time  . iron polysaccharides (NIFEREX) 150 MG capsule Take 1 capsule (150 mg total) by mouth daily.   11/20/2018  . magnesium hydroxide (MILK OF MAGNESIA) 400 MG/5ML suspension Take 30 mLs by mouth daily as needed for mild constipation.   11/21/2018  . Prenatal Vit-Fe Fumarate-FA (PRENATAL MULTIVITAMIN) TABS tablet Take 1 tablet by mouth daily at 12 noon.   11/20/2018   . benzocaine-Menthol (DERMOPLAST) 20-0.5 % AERO Apply 1 application topically as needed for irritation (perineal discomfort).     . coconut oil OIL Apply 1 application topically as needed.  0   . ibuprofen (ADVIL,MOTRIN) 800 MG tablet Take 1 tablet (800 mg total) by mouth every 6 (six) hours. 30 tablet 0   . magnesium oxide (MAG-OX) 400 (241.3 Mg) MG tablet Take 1 tablet (400 mg total) by mouth daily.      Allergies: No Known Allergies  Family History  Problem Relation Age of Onset  . Hypertension Mother    Social History:  reports that she has never smoked. She has never used smokeless tobacco. She reports that she does not drink alcohol or use drugs.  ROS: A complete review of systems was performed.  All systems are negative except for pertinent findings as noted. Review of Systems  Constitutional: Positive for malaise/fatigue.  All other systems reviewed and are negative.    Physical Exam:  Vital signs in last 24 hours: Temp:  [97.9 F (36.6 C)-101.8 F (38.8 C)] 98.5 F (36.9 C) (10/29 1600) Pulse Rate:  [113-157] 121 (10/29 1700) Resp:  [16-24] 22 (10/29 1600) BP: (79-136)/(35-117) 98/60 (10/29 1700) SpO2:  [91 %-99 %] 93 % (10/29 1700) General:  Alert and oriented, No acute distress HEENT: Normocephalic, atraumatic Neck: No JVD or lymphadenopathy Cardiovascular: Regular rate and rhythm Lungs: Regular rate and effort Abdomen: Soft, nontender, nondistended, no abdominal masses Back: No CVA tenderness Extremities: No  edema Neurologic: Grossly intact Nurses were chaperone-I examined her outside of the CT scanner in radiology.  Laboratory Data:  Results for orders placed or performed during the hospital encounter of 11/23/18 (from the past 24 hour(s))  CBC     Status: Abnormal   Collection Time: 11/23/18  6:13 PM  Result Value Ref Range   WBC 20.1 (H) 4.0 - 10.5 K/uL   RBC 3.98 3.87 - 5.11 MIL/uL   Hemoglobin 10.6 (L) 12.0 - 15.0 g/dL   HCT 16.133.4 (L) 09.636.0 - 04.546.0 %    MCV 83.9 80.0 - 100.0 fL   MCH 26.6 26.0 - 34.0 pg   MCHC 31.7 30.0 - 36.0 g/dL   RDW 40.915.3 81.111.5 - 91.415.5 %   Platelets 220 150 - 400 K/uL   nRBC 0.0 0.0 - 0.2 %  Comprehensive metabolic panel     Status: Abnormal   Collection Time: 11/23/18  6:13 PM  Result Value Ref Range   Sodium 131 (L) 135 - 145 mmol/L   Potassium 3.5 3.5 - 5.1 mmol/L   Chloride 104 98 - 111 mmol/L   CO2 17 (L) 22 - 32 mmol/L   Glucose, Bld 83 70 - 99 mg/dL   BUN 5 (L) 6 - 20 mg/dL   Creatinine, Ser 7.820.53 0.44 - 1.00 mg/dL   Calcium 8.4 (L) 8.9 - 10.3 mg/dL   Total Protein 6.9 6.5 - 8.1 g/dL   Albumin 2.6 (L) 3.5 - 5.0 g/dL   AST 15 15 - 41 U/L   ALT 10 0 - 44 U/L   Alkaline Phosphatase 142 (H) 38 - 126 U/L   Total Bilirubin 1.1 0.3 - 1.2 mg/dL   GFR calc non Af Amer >60 >60 mL/min   GFR calc Af Amer >60 >60 mL/min   Anion gap 10 5 - 15  Type and screen [facility]     Status: None   Collection Time: 11/23/18  8:42 PM  Result Value Ref Range   ABO/RH(D) O POS    Antibody Screen NEG    Sample Expiration      11/26/2018,2359 Performed at [facility], [address] [place], [place] [address]   ABO/Rh     Status: None   Collection Time: 11/23/18  8:42 PM  Result Value Ref Range   ABO/RH(D)      O POS Performed at [facility], [address] [place], [place] [address]   CBC with Differential     Status: Abnormal   Collection Time: 11/24/18  4:48 AM  Result Value Ref Range   WBC 14.8 (H) 4.0 - 10.5 K/uL   RBC 3.40 (L) 3.87 - 5.11 MIL/uL   Hemoglobin 9.1 (L) 12.0 - 15.0 g/dL   HCT 78.428.6 (L) 69.636.0 - 29.546.0 %   MCV 84.1 80.0 - 100.0 fL   MCH 26.8 26.0 - 34.0 pg   MCHC 31.8 30.0 - 36.0 g/dL   RDW 28.415.6 (H) 13.211.5 - 44.015.5 %   Platelets 177 150 - 400 K/uL   nRBC 0.0 0.0 - 0.2 %   Neutrophils Relative % 81 %   Neutro Abs 12.1 (H) 1.7 - 7.7 K/uL   Lymphocytes Relative 6 %   Lymphs Abs 0.8 0.7 - 4.0 K/uL   Monocytes Relative 12 %   Monocytes Absolute 1.7 (H) 0.1 - 1.0 K/uL    Eosinophils Relative 0 %   Eosinophils Absolute 0.0 0.0 - 0.5 K/uL   Basophils Relative 0 %  Basophils Absolute 0.0 0.0 - 0.1 K/uL   Immature Granulocytes 1 %   Abs Immature Granulocytes 0.13 (H) 0.00 - 0.07 K/uL  Lactic acid, plasma     Status: None   Collection Time: 11/24/18  4:48 AM  Result Value Ref Range   Lactic Acid, Venous 1.7 0.5 - 1.9 mmol/L  Culture, blood (x 2)     Status: None (Preliminary result)   Collection Time: 11/24/18  4:48 AM   Specimen: BLOOD  Result Value Ref Range   Specimen Description BLOOD RIGHT ANTECUBITAL    Special Requests      BOTTLES DRAWN AEROBIC AND ANAEROBIC Blood Culture adequate volume Performed at [facility], [address], [place], [place] [address]    Culture PENDING    Report Status PENDING   Culture, blood (x 2)     Status: None (Preliminary result)   Collection Time: 11/24/18  4:48 AM   Specimen: BLOOD  Result Value Ref Range   Specimen Description BLOOD RIGHT ANTECUBITAL    Special Requests      BOTTLES DRAWN AEROBIC AND ANAEROBIC Blood Culture adequate volume Performed at [facility] [address] [place], [place] [address]    Culture PENDING    Report Status PENDING   Comprehensive metabolic panel     Status: Abnormal   Collection Time: 11/24/18  4:48 AM  Result Value Ref Range   Sodium 132 (L) 135 - 145 mmol/L   Potassium 3.1 (L) 3.5 - 5.1 mmol/L   Chloride 105 98 - 111 mmol/L   CO2 16 (L) 22 - 32 mmol/L   Glucose, Bld 94 70 - 99 mg/dL   BUN <5 (L) 6 - 20 mg/dL   Creatinine, Ser 0.56 0.44 - 1.00 mg/dL   Calcium 7.8 (L) 8.9 - 10.3 mg/dL   Total Protein 6.0 (L) 6.5 - 8.1 g/dL   Albumin 2.2 (L) 3.5 - 5.0 g/dL   AST 16 15 - 41 U/L   ALT 9 0 - 44 U/L   Alkaline Phosphatase 124 38 - 126 U/L   Total Bilirubin 0.7 0.3 - 1.2 mg/dL   GFR calc non Af Amer >60 >60 mL/min   GFR calc Af Amer >60 >60 mL/min   Anion gap 11 5 - 15  Protime-INR     Status: None   Collection Time: 11/24/18  4:48 AM  Result Value  Ref Range   Prothrombin Time 14.1 11.4 - 15.2 seconds   INR 1.1 0.8 - 1.2  APTT     Status: None   Collection Time: 11/24/18  4:48 AM  Result Value Ref Range   aPTT 35 24 - 36 seconds  Fibrinogen     Status: Abnormal   Collection Time: 11/24/18  4:48 AM  Result Value Ref Range   Fibrinogen 559 (H) 210 - 475 mg/dL  Urinalysis, Routine w reflex microscopic     Status: Abnormal   Collection Time: 11/24/18  5:25 AM  Result Value Ref Range   Color, Urine AMBER (A) YELLOW   APPearance CLOUDY (A) CLEAR   Specific Gravity, Urine 1.014 1.005 - 1.030   pH 6.0 5.0 - 8.0   Glucose, UA NEGATIVE NEGATIVE mg/dL   Hgb urine dipstick SMALL (A) NEGATIVE   Bilirubin Urine NEGATIVE NEGATIVE   Ketones, ur 20 (A) NEGATIVE mg/dL   Protein, ur 30 (A) NEGATIVE mg/dL   Nitrite POSITIVE (A) NEGATIVE   Leukocytes,Ua MODERATE (A) NEGATIVE   RBC / HPF 0-5 0 -  5 RBC/hpf   WBC, UA >50 (H) 0 - 5 WBC/hpf   Bacteria, UA FEW (A) NONE SEEN   Squamous Epithelial / LPF 0-5 0 - 5   WBC Clumps PRESENT    Mucus PRESENT    Hyaline Casts, UA PRESENT   Lactic acid, plasma     Status: None   Collection Time: 11/24/18  8:15 AM  Result Value Ref Range   Lactic Acid, Venous 1.2 0.5 - 1.9 mmol/L  MRSA PCR Screening     Status: None   Collection Time: 11/24/18 11:26 AM   Specimen: Nasal Mucosa; Nasopharyngeal  Result Value Ref Range   MRSA by PCR NEGATIVE NEGATIVE  Glucose, capillary     Status: None   Collection Time: 11/24/18 11:47 AM  Result Value Ref Range   Glucose-Capillary 74 70 - 99 mg/dL  Lactic acid, plasma     Status: None   Collection Time: 11/24/18 12:01 PM  Result Value Ref Range   Lactic Acid, Venous 1.0 0.5 - 1.9 mmol/L   Recent Results (from the past 240 hour(s))  SARS CORONAVIRUS 2 (TAT 6-24 HRS) Nasopharyngeal Nasopharyngeal Swab     Status: None   Collection Time: 11/23/18  5:37 PM   Specimen: Nasopharyngeal Swab  Result Value Ref Range Status   SARS Coronavirus 2 NEGATIVE NEGATIVE Final     Comment: (NOTE) SARS-CoV-2 target nucleic acids are NOT DETECTED. The SARS-CoV-2 RNA is generally detectable in upper and lower respiratory specimens during the acute phase of infection. Negative results do not preclude SARS-CoV-2 infection, do not rule out co-infections with other pathogens, and should not be used as the sole basis for treatment or other patient management decisions. Negative results must be combined with clinical observations, patient history, and epidemiological information. The expected result is Negative. Fact Sheet for Patients: HairSlick.no Fact Sheet for Healthcare Providers: quierodirigir.com This test is not yet approved or cleared by the [place] FDA and  has been authorized for detection and/or diagnosis of SARS-CoV-2 by FDA under an Emergency Use Authorization (EUA). This EUA will remain  in effect (meaning this test can be used) for the duration of the COVID-19 declaration under Section 56 4(b)(1) of the Act, 21 U.S.C. section 360bbb-3(b)(1), unless the authorization is terminated or revoked sooner. Performed at [facility], [address], [place], [place] [address]   Culture, [place] Urine     Status: Abnormal (Preliminary result)   Collection Time: 11/23/18  5:38 PM   Specimen: Urine, Random  Result Value Ref Range Status   Specimen Description URINE, RANDOM  Final   Special Requests NONE  Final   Culture (A)  Final    >=100,000 COLONIES/mL ESCHERICHIA COLI SUSCEPTIBILITIES TO FOLLOW NO GROUP B STREP (S.AGALACTIAE) ISOLATED Performed at [facility], [address], [place], [place] [address]    Report Status PENDING  Incomplete  Culture, blood (x 2)     Status: None (Preliminary result)   Collection Time: 11/24/18  4:48 AM   Specimen: BLOOD  Result Value Ref Range Status   Specimen Description BLOOD RIGHT ANTECUBITAL  Final   Special Requests   Final    BOTTLES DRAWN  AEROBIC AND ANAEROBIC Blood Culture adequate volume Performed at [facility], [address], [place], [place] [address]    Culture PENDING  Incomplete   Report Status PENDING  Incomplete  Culture, blood (x 2)     Status: None (Preliminary result)   Collection Time: 11/24/18  4:48 AM   Specimen: BLOOD  Result Value Ref Range Status   Specimen Description BLOOD RIGHT ANTECUBITAL  Final   Special Requests   Final    BOTTLES DRAWN AEROBIC AND ANAEROBIC Blood Culture adequate volume Performed at [facility], [address], [place], [place] [address]    Culture PENDING  Incomplete   Report Status PENDING  Incomplete  MRSA PCR Screening     Status: None   Collection Time: 11/24/18 11:26 AM   Specimen: Nasal Mucosa; Nasopharyngeal  Result Value Ref Range Status   MRSA by PCR NEGATIVE NEGATIVE Final    Comment:        The GeneXpert MRSA Assay (FDA approved for NASAL specimens only), is one component of a comprehensive MRSA colonization surveillance program. It is not intended to diagnose MRSA infection nor to guide or monitor treatment for MRSA infections. Performed at [facility], [address] [place], [place] [address]    Creatinine: Recent Labs    11/23/18 1813 11/24/18 0448  CREATININE 0.53 0.56    Impression/Assessment/plan:  UTI/sepsis-she has been treated and resuscitated now by critical care.  Culture pending.  Right hydronephrosis-we discussed this could be physiologic, but the ultrasound only shows the hydronephrosis and not necessarily what is causing it.  We went over the nature risk and benefits of continued surveillance and medical treatment versus getting better imaging with CT scan.  We discussed radiation exposure to her and the baby.  According to ACOG guidelines on imaging CT scan should not be withheld and is safe to perform.  Patient does elected to proceed with CT.  She asked what we would do if we did find an obstructing stone and  we went over the nature risk and benefits of continued surveillance, right nephrostomy tube or right ureteral stent placement.  She would like to proceed with right nephrostomy tube.  She would not want an internalized stent with risk of stent pain and infection.  Discussed with NP Babcock.  Addendum: CT without stone, spoke with nurse. Patient should favor her left side to take pressure off the right. I don't feel she needs a nephrostomy tube if she is responding to medial treatment. I will sign off but please page GU with any questions, concerns, changes in pt status. Also, since she had such an infection a single nightly prophylactic abx dose could be considered for the last few weeks of her pregnancy once her treatment course is complete.  Jerilee Field 11/24/2018, 6:08 PM

## 2018-11-24 NOTE — Progress Notes (Addendum)
   NAME:  Courtney Colon, MRN:  [id number], DOB:  1995/02/16, LOS: 1 ADMISSION DATE:  11/23/2018, CONSULTATION DATE:  11/24/18 REFERRING MD: Dr. Pamala Hurry , CHIEF COMPLAINT:  Code Sepsis  Brief History   23 year old female currently [redacted] weeks pregnant who presented to the emergency department yesterday with fever and abdominal pain.  UA consistent with UTI, she was admitted by OB and started on ceftriaxone, not in active labor.  Overnight, patient became increasingly tachycardic and blood pressure dropped to 79/57.  A code sepsis was called and PCCM consulted.    Past Medical History   has a past medical history of Anemia and varicella.  Significant Hospital Events   10/28 Admit to [facility] 10/29 Code Sepsis; remains hypotensive and tachycardic after 5 liters of crystalloid. Moved to ICU.   Consults:  PCCM  Procedures:    Significant Diagnostic Tests:   Renal US 10/29>>>  Micro Data:  BCx2>> Sars-Cov-2>>neg UC 10/28>>>  Antimicrobials:  Ceftriaxone 10/28-  Interim history/subjective:  Feels "hot"  Right flank pain improved.   Objective   Blood pressure (Abnormal) 82/35, pulse (Abnormal) 144, temperature (Abnormal) 101.8 F (38.8 C), resp. rate 18, weight 69.2 kg, SpO2 99 %, unknown if currently breastfeeding.        Intake/Output Summary (Last 24 hours) at 11/24/2018 1033 Last data filed at 11/24/2018 1000 Gross per 24 hour  Intake 4101.64 ml  Output 1250 ml  Net 2851.64 ml   Filed Weights   11/23/18 1712  Weight: 69.2 kg   General this is a otherwise healthy 23 year old female currently febrile w/ intermittent rigors. Currently not in acute distress.  HENT NCAT no JVD. MMM. Sclera not icteric  Pulm Clear to ausc no accessory muscle use. Mild increased resp efforts.  Card RRR no MRG, tachycardic  Ext brisk cap refill, no edema  abd soft large + bowel sounds Neuro awake no distress. No focal def   Resolved Hospital Problem list   Abdominal pain  Assessment & Plan:    Severe Sepsis/septic shock;  likely secondary to UT source/pyelonephritis  -her LA had actually improved.  -remains tachycardic; this may be both physiologic from pregnancy and on-going fever.  Plan Move to ICU Complete current fluid bolus and MIVFs Day 2 rocephin; will await cultures; may need to consider changing to unasyn Check renal US to r/o hydro Peripheral neo (given tachycardia) Pulse ox Tele   Fluid and electrolyte imbalance: hyponatremia and hypokalemia  Plan Replace K  Mild metabolic acidosis; initially was NAG. Has had nml Lactate; she is at risk for hyperchloremia from NaCl replacement  Plan Change IVFs to LR  Repeat chemistry   Pregnancy  Plan OB following    Best practice  Diet: as tolerated Code status: full Glycemic control: NA DVT prophylaxis scds Sedation: NA Vent: NA  dispo To ICU   CRITICAL CARE My cct 34 minutes.   Erick Colace ACNP-BC [place] Pager # [phone] OR # [phone] if no answer

## 2018-11-24 NOTE — Progress Notes (Signed)
Pt. Continues to report good fetal movement. No  Regular ctx. Her last contraction was over 10 mins ago. She will make me aware of her next contraction and we will time.   12:26 moderate contraction lasting 50 secs that pt. Breathed through.

## 2018-11-24 NOTE — Progress Notes (Signed)
Spoke to Urology re: worsening right hydro. Although she has improved somewhat she still is tachycardic.  We will go forward w/ CT renal  May need perc drain Urology has agreed to see in consult.   Erick Colace ACNP-BC [place] Pager # [phone] OR # [phone] if no answer

## 2018-11-24 NOTE — Progress Notes (Signed)
Call to Dr. Benjie Karvonen re: continued sustained tachycardia. Pt's temp is slowly increasing and she is shaking/reporting a feeling of chills. Tylenol 650 mg given by ICU RN Apolonio Schneiders. Blankets removed. Dr. Benjie Karvonen on way to review strip.

## 2018-11-24 NOTE — Progress Notes (Signed)
Dr Pamala Hurry notified of patient's deteriorating status.  T101.8, pt complaining of ctx, EFM applied baseline 160.  RN to admin additional liter of NS.  650mg  tylenol given.   Puja, RR RN notified.

## 2018-11-24 NOTE — Progress Notes (Signed)
Called to see patient for concern for sepsis  Subjective: Patient notes no fever, no emesis but decreased appetite.  Patient notes intermittent chest pressure that lasts for a few seconds.  Denies history of reflux.  Patient states abdominal pain is similar to admission.  No fevers.  Good fetal movement.  No leakage of fluid or vaginal bleeding.  Vitals:   11/24/18 0519 11/24/18 0530 11/24/18 0550 11/24/18 0600  BP:  (!) 106/44 (!) 96/43 (!) 83/46  Pulse:  (!) 138 (!) 129 (!) 121  Temp: 98.2 F (36.8 C)     Resp:      Weight:      SpO2:  98%    TempSrc: Oral      General: Appears ill, no acute distress Cardiovascular: Tachycardic Pulmonary: Clear to auscultation bilaterally, no crackles Abdomen: No right upper quadrant pain.  Reducible nontender umbilical hernia.  Mild fundal tenderness but no lateral uterine tenderness.  Mild suprapubic tenderness.  Right sided costovertebral angle tenderness present. GU: Cervix 1 cm, 50% effaced, vertex, -2.  No lower segment tenderness.  Bladder tenderness present Lower extremity: No edema  Toco: No contractions FH: 125's, positive accelerations, no decelerations, 10 beat variability  In and out urine catheterization: Consent from patient.  Sterile gloves.  Betadine x3.  Small catheter inserted through the urethra into the bladder to obtain a sample of dark orange urine.  Patient tolerated well  Catheterized urine micro: Greater than 50 WBCs  CBC Latest Ref Rng & Units 11/24/2018 11/23/2018 07/28/2017  WBC 4.0 - 10.5 K/uL 14.8(H) 20.1(H) 6.7  Hemoglobin 12.0 - 15.0 g/dL 9.1(L) 10.6(L) 8.9(L)  Hematocrit 36.0 - 46.0 % 28.6(L) 33.4(L) 27.7(L)  Platelets 150 - 400 K/uL 177 220 160   Chest x-ray: Normal Blood cultures: Sent and pending.  Sent after patient received 2 g IV Rocephin Urine culture: Pending Coags fibrinogen normal for pregnancy, INR 1.1 Lactic acid: Pending  Assessment and plan 23 year old G4 P3 at 35 weeks and 6 days with  right-sided pyelonephritis and sepsis Pyelonephritis.  Rocephin 2 g IV every 24 hours.  Blood cultures pending but exam consistent with pyelonephritis.  Differential diagnosis includes appendicitis and incarcerated hernia however patient without anorexia or surgical abdomen.  Chorioamnionitis also in the differential diagnosis however no significant uterine tenderness, no contractions and reactive fetal testing.  Sepsis.  Critical care consulted.  IV fluids running.  Slight improvement in tachycardia and hypotension on aggressive IV fluids.  Concern for ARDS given pregnancy, large IV fluids bolus and chorioamnionitis.  We will continue to watch.  Anemia.  Chronic anemia and per primary provider patient has been noncompliant with iron.  Expect anemia to worsen given pyelonephritis.  We will plan IV iron prior to discharge  Fetal testing.  Reactive fetal testing at this time  90 minutes spent in critical care time on the floor with this patient and coordinating care.  Ala Dach 11/24/2018 6:33 AM

## 2018-11-24 NOTE — Progress Notes (Signed)
Per OB doctor okay with patient blood pressure systolic above 85 and MAP around 60.

## 2018-11-24 NOTE — Progress Notes (Signed)
FHR displaying.

## 2018-11-24 NOTE — Progress Notes (Signed)
Pt transferred to 2M07. EFM applied upon arrival at 2M07. OBIX unable to display current strip.  EFM 140 per monitor.  IT contacted for assistance for OBIX. Report given to Otila Back RN for [facility] monitoring and Lanette Hampshire for ICU care.

## 2018-11-24 NOTE — Progress Notes (Signed)
Call to Dr. Benjie Karvonen re: FHR tracing and sustained tachycardia. 2L  applied to pt. Due to her complaint that she had difficulty breathing. Sp02 99%. HR 120

## 2018-11-24 NOTE — Progress Notes (Signed)
Relief of nurse Otila Back at [phone].  Dr. Benjie Karvonen notified at 1645 of order for CT renal. RN confirmed with Dr. Benjie Karvonen that patient may come off of continuous EFM for CT. Continuous EFM will resume upon return to unit.

## 2018-11-24 NOTE — Progress Notes (Addendum)
Patient ID: Courtney Colon, female   DOB: 1995/05/12, 23 y.o.   MRN: [id number] 23 yo [id number] at 35 wks. pt assessed due to feta tachycardia. CT Stone study neg. Pyelonephritis.  Septicemia from E.coli UTI/ Pyelonephritis. Rocephin started yesterday, Zosyn added by ICU team.  Low BPs, tachycardia, O2 sats RA nl. Fever leading to maternal and fetal tachycardia, but resolved after Tylenol 650 mg. Shaking resolved but still not feeling too well. Will cool off with cold packs and tylenol 650mg  as needed after 4 hrs FHT from 180s now to 150s', mod variability, + accels,, no decels- reactive toco rare, not appreciated by pt Continuous fetal monitoring

## 2018-11-24 NOTE — Significant Event (Addendum)
Rapid Response Event Note  Overview: Follow Up - Sepsis   Initial Focused Assessment: RN called with concern of temperature of 101.8, increased tachycardia into the 150s , and patient overall not looking well. I asked the nurse to have the OB provider reach back out to [facility] team as well. I came by to [name], she was again, quite fatigued, + chills, she stated that she is not having respiratory distress but overall does not feel well. HR 140-150s, + 2 pulses, BP 82/35 (51), 99% on RA, good overall respiratory status. Per nurse, OB MD ordered 5th L of NS  Interventions: -- No RRT Interventions  Plan of Care: -- PCCM team can to the bedside, patient will transfer to ICU for close monitoring and vasopressor support.  -- Transferred to 2M07   Event Summary:  Start Time 0955 End Time 1115  Anthonymichael Munday R

## 2018-11-24 NOTE — Progress Notes (Addendum)
ANTEPARTUM NOTE - Hospital Day 1  Subjective:  Denies VB, LOF Fetal activity normal      Some BH contractions, but decreasing in intensity since earlier this morning   Objective: Vital signs: VS: Blood pressure (!) 95/47, pulse (!) 150, temperature (!) 101.8 F (38.8 C), resp. rate 18, weight 69.2 kg, SpO2 98 %, unknown if currently breastfeeding. 11/24/18 1300  -  127Abnormal   126Abnormal   20  101/65  -  91 %  -  - AG   11/24/18 1200  -  124Abnormal   123Abnormal   24Abnormal   99/52Abnormal   -  97 %  -  - RJ   11/24/18 1124  98.8 F (37.1 C)  -  -  -  -  -  -  -  - RJ   11/24/18 1112  -  -  137Abnormal   18  114/49Abnormal   Lying  -  -  - RJ   11/24/18 1030  -  150Abnormal   147Abnormal   -  95/47Abnormal   -  98 %  -  - RP   11/24/18 1028  -  150Abnormal   150Abnormal   -  101/47Abnormal   -  -  -  - RP   11/24/18 1015  -  137Abnormal   138Abnormal   -  84/41Abnormal   -  98 %  -  - PP   11/24/18 1007  -  144Abnormal   141Abnormal   -  82/35Abnormal   -  99 %  -  - PP   11/24/18 1000  -  -  147Abnormal   -  -  -  -  -  - RP   11/24/18 0944  101.8 F (38.8 C)Abnormal   -  142Abnormal   -  -  -  -  -  - RP    Labs: Results for orders placed or performed during the hospital encounter of 11/23/18 (from the past 24 hour(s))  Urinalysis, Routine w reflex microscopic     Status: Abnormal   Collection Time: 11/23/18  5:37 PM  Result Value Ref Range   Color, Urine YELLOW YELLOW   APPearance CLOUDY (A) CLEAR   Specific Gravity, Urine 1.003 (L) 1.005 - 1.030   pH 6.0 5.0 - 8.0   Glucose, UA NEGATIVE NEGATIVE mg/dL   Hgb urine dipstick SMALL (A) NEGATIVE   Bilirubin Urine NEGATIVE NEGATIVE   Ketones, ur 5 (A) NEGATIVE mg/dL   Protein, ur NEGATIVE NEGATIVE mg/dL   Nitrite POSITIVE (A) NEGATIVE   Leukocytes,Ua LARGE (A) NEGATIVE   RBC / HPF 0-5 0 - 5 RBC/hpf   WBC, UA >50 (H) 0 - 5 WBC/hpf   Bacteria, UA MANY (A) NONE SEEN   Squamous Epithelial / LPF 11-20 0 - 5  SARS  CORONAVIRUS 2 (TAT 6-24 HRS) Nasopharyngeal Nasopharyngeal Swab     Status: None   Collection Time: 11/23/18  5:37 PM   Specimen: Nasopharyngeal Swab  Result Value Ref Range   SARS Coronavirus 2 NEGATIVE NEGATIVE  CBC     Status: Abnormal   Collection Time: 11/23/18  6:13 PM  Result Value Ref Range   WBC 20.1 (H) 4.0 - 10.5 K/uL   RBC 3.98 3.87 - 5.11 MIL/uL   Hemoglobin 10.6 (L) 12.0 - 15.0 g/dL   HCT 33.4 (L) 36.0 - 46.0 %   MCV 83.9 80.0 - 100.0 fL   MCH 26.6 26.0 - 34.0 pg  MCHC 31.7 30.0 - 36.0 g/dL   RDW 70.3 50.0 - 93.8 %   Platelets 220 150 - 400 K/uL   nRBC 0.0 0.0 - 0.2 %  Comprehensive metabolic panel     Status: Abnormal   Collection Time: 11/23/18  6:13 PM  Result Value Ref Range   Sodium 131 (L) 135 - 145 mmol/L   Potassium 3.5 3.5 - 5.1 mmol/L   Chloride 104 98 - 111 mmol/L   CO2 17 (L) 22 - 32 mmol/L   Glucose, Bld 83 70 - 99 mg/dL   BUN 5 (L) 6 - 20 mg/dL   Creatinine, Ser 1.82 0.44 - 1.00 mg/dL   Calcium 8.4 (L) 8.9 - 10.3 mg/dL   Total Protein 6.9 6.5 - 8.1 g/dL   Albumin 2.6 (L) 3.5 - 5.0 g/dL   AST 15 15 - 41 U/L   ALT 10 0 - 44 U/L   Alkaline Phosphatase 142 (H) 38 - 126 U/L   Total Bilirubin 1.1 0.3 - 1.2 mg/dL   GFR calc non Af Amer >60 >60 mL/min   GFR calc Af Amer >60 >60 mL/min   Anion gap 10 5 - 15  Type and screen [facility]     Status: None   Collection Time: 11/23/18  8:42 PM  Result Value Ref Range   ABO/RH(D) O POS    Antibody Screen NEG    Sample Expiration      11/26/2018,2359 Performed at [facility], [address] [place], [place] [address]   ABO/Rh     Status: None   Collection Time: 11/23/18  8:42 PM  Result Value Ref Range   ABO/RH(D)      O POS Performed at [facility], [address] [place], [place] [address]   CBC with Differential     Status: Abnormal   Collection Time: 11/24/18  4:48 AM  Result Value Ref Range   WBC 14.8 (H) 4.0 - 10.5 K/uL   RBC 3.40 (L) 3.87 - 5.11 MIL/uL    Hemoglobin 9.1 (L) 12.0 - 15.0 g/dL   HCT 93.8 (L) 10.1 - 75.1 %   MCV 84.1 80.0 - 100.0 fL   MCH 26.8 26.0 - 34.0 pg   MCHC 31.8 30.0 - 36.0 g/dL   RDW 02.5 (H) 85.2 - 77.8 %   Platelets 177 150 - 400 K/uL   nRBC 0.0 0.0 - 0.2 %   Neutrophils Relative % 81 %   Neutro Abs 12.1 (H) 1.7 - 7.7 K/uL   Lymphocytes Relative 6 %   Lymphs Abs 0.8 0.7 - 4.0 K/uL   Monocytes Relative 12 %   Monocytes Absolute 1.7 (H) 0.1 - 1.0 K/uL   Eosinophils Relative 0 %   Eosinophils Absolute 0.0 0.0 - 0.5 K/uL   Basophils Relative 0 %   Basophils Absolute 0.0 0.0 - 0.1 K/uL   Immature Granulocytes 1 %   Abs Immature Granulocytes 0.13 (H) 0.00 - 0.07 K/uL  Lactic acid, plasma     Status: None   Collection Time: 11/24/18  4:48 AM  Result Value Ref Range   Lactic Acid, Venous 1.7 0.5 - 1.9 mmol/L  Culture, blood (x 2)     Status: None (Preliminary result)   Collection Time: 11/24/18  4:48 AM   Specimen: BLOOD  Result Value Ref Range   Specimen Description BLOOD RIGHT ANTECUBITAL    Special Requests      BOTTLES DRAWN AEROBIC AND ANAEROBIC  Blood Culture adequate volume Performed at [facility], [address] [place], [place] [address]    Culture PENDING    Report Status PENDING   Culture, blood (x 2)     Status: None (Preliminary result)   Collection Time: 11/24/18  4:48 AM   Specimen: BLOOD  Result Value Ref Range   Specimen Description BLOOD RIGHT ANTECUBITAL    Special Requests      BOTTLES DRAWN AEROBIC AND ANAEROBIC Blood Culture adequate volume Performed at [facility], [address] [place], [place] [address]    Culture PENDING    Report Status PENDING   Comprehensive metabolic panel     Status: Abnormal   Collection Time: 11/24/18  4:48 AM  Result Value Ref Range   Sodium 132 (L) 135 - 145 mmol/L   Potassium 3.1 (L) 3.5 - 5.1 mmol/L   Chloride 105 98 - 111 mmol/L   CO2 16 (L) 22 - 32 mmol/L   Glucose, Bld 94 70 - 99 mg/dL   BUN <5 (L) 6 - 20 mg/dL   Creatinine,  Ser 0.980.56 0.44 - 1.00 mg/dL   Calcium 7.8 (L) 8.9 - 10.3 mg/dL   Total Protein 6.0 (L) 6.5 - 8.1 g/dL   Albumin 2.2 (L) 3.5 - 5.0 g/dL   AST 16 15 - 41 U/L   ALT 9 0 - 44 U/L   Alkaline Phosphatase 124 38 - 126 U/L   Total Bilirubin 0.7 0.3 - 1.2 mg/dL   GFR calc non Af Amer >60 >60 mL/min   GFR calc Af Amer >60 >60 mL/min   Anion gap 11 5 - 15  Protime-INR     Status: None   Collection Time: 11/24/18  4:48 AM  Result Value Ref Range   Prothrombin Time 14.1 11.4 - 15.2 seconds   INR 1.1 0.8 - 1.2  APTT     Status: None   Collection Time: 11/24/18  4:48 AM  Result Value Ref Range   aPTT 35 24 - 36 seconds  Fibrinogen     Status: Abnormal   Collection Time: 11/24/18  4:48 AM  Result Value Ref Range   Fibrinogen 559 (H) 210 - 475 mg/dL  Urinalysis, Routine w reflex microscopic     Status: Abnormal   Collection Time: 11/24/18  5:25 AM  Result Value Ref Range   Color, Urine AMBER (A) YELLOW   APPearance CLOUDY (A) CLEAR   Specific Gravity, Urine 1.014 1.005 - 1.030   pH 6.0 5.0 - 8.0   Glucose, UA NEGATIVE NEGATIVE mg/dL   Hgb urine dipstick SMALL (A) NEGATIVE   Bilirubin Urine NEGATIVE NEGATIVE   Ketones, ur 20 (A) NEGATIVE mg/dL   Protein, ur 30 (A) NEGATIVE mg/dL   Nitrite POSITIVE (A) NEGATIVE   Leukocytes,Ua MODERATE (A) NEGATIVE   RBC / HPF 0-5 0 - 5 RBC/hpf   WBC, UA >50 (H) 0 - 5 WBC/hpf   Bacteria, UA FEW (A) NONE SEEN   Squamous Epithelial / LPF 0-5 0 - 5   WBC Clumps PRESENT    Mucus PRESENT    Hyaline Casts, UA PRESENT   Lactic acid, plasma     Status: None   Collection Time: 11/24/18  8:15 AM  Result Value Ref Range   Lactic Acid, Venous 1.2 0.5 - 1.9 mmol/L    Physical exam:        General appearance/behavior: anxious        Per MD assessment  Bimanual exam / VE: Dilation: 1 Effacement (%): 50, 60 Station: -3 Exam by:: M Sigmon CNM        Fetal Assessment:  FHR: baseline: 160bpm/ moderate varability/+15x15accels/ no decels  TOCO irregular  ctxs/ mild   Assessment: 35+[redacted] weeks gestation FHR category 1 Severe sepsis/septic shock secondary to UT source/pyleneophritis (per PCCM a/p)  Plan:  S/p PCCM Consult Transfer to 83M - MSICU room 7 for management  We appreciate the consult and co-management from PCCM and will continue to follow   Dr. Ernestina Penna updated with patient status / plan of care  Carlean Jews, [facility]

## 2018-11-24 NOTE — Progress Notes (Signed)
@  0400 pt c/o chest pressure and her HR was in the 140s. BP was 79/57. Temp was WNL.  @0406  this RN called Dr Pamala Hurry. Dr. Pamala Hurry ordered labs and would call RN back with plan of care. Code sepsis was then called. [place] AC, RRT and critical care MD were notified and came to bedside.

## 2018-11-24 NOTE — Progress Notes (Signed)
Biomedical to room to figure out monitoring capabilities.

## 2018-11-25 ENCOUNTER — Inpatient Hospital Stay (HOSPITAL_COMMUNITY): Payer: No Typology Code available for payment source

## 2018-11-25 DIAGNOSIS — N1330 Unspecified hydronephrosis: Secondary | ICD-10-CM

## 2018-11-25 DIAGNOSIS — D649 Anemia, unspecified: Secondary | ICD-10-CM

## 2018-11-25 DIAGNOSIS — A4151 Sepsis due to Escherichia coli [E. coli]: Secondary | ICD-10-CM

## 2018-11-25 DIAGNOSIS — R52 Pain, unspecified: Secondary | ICD-10-CM | POA: Diagnosis not present

## 2018-11-25 DIAGNOSIS — M7989 Other specified soft tissue disorders: Secondary | ICD-10-CM

## 2018-11-25 DIAGNOSIS — O2303 Infections of kidney in pregnancy, third trimester: Secondary | ICD-10-CM | POA: Diagnosis not present

## 2018-11-25 LAB — COMPREHENSIVE METABOLIC PANEL
ALT: 10 U/L (ref 0–44)
AST: 14 U/L — ABNORMAL LOW (ref 15–41)
Albumin: 1.7 g/dL — ABNORMAL LOW (ref 3.5–5.0)
Alkaline Phosphatase: 107 U/L (ref 38–126)
Anion gap: 6 (ref 5–15)
BUN: <5 mg/dL — ABNORMAL LOW (ref 6–20)
CO2: 16 mmol/L — ABNORMAL LOW (ref 22–32)
Calcium: 7.8 mg/dL — ABNORMAL LOW (ref 8.9–10.3)
Chloride: 114 mmol/L — ABNORMAL HIGH (ref 98–111)
Creatinine, Ser: 0.48 mg/dL (ref 0.44–1.00)
GFR calc Af Amer: >60 mL/min (ref >60)
GFR calc non Af Amer: >60 mL/min (ref >60)
Glucose, Bld: 79 mg/dL (ref 70–99)
Potassium: 3.4 mmol/L — ABNORMAL LOW (ref 3.5–5.1)
Sodium: 136 mmol/L (ref 135–145)
Total Bilirubin: 0.5 mg/dL (ref 0.3–1.2)
Total Protein: 4.7 g/dL — ABNORMAL LOW (ref 6.5–8.1)

## 2018-11-25 LAB — CBC
HCT: 25.9 % — ABNORMAL LOW (ref 36.0–46.0)
Hemoglobin: 8 g/dL — ABNORMAL LOW (ref 12.0–15.0)
MCH: 26.7 pg (ref 26.0–34.0)
MCHC: 30.9 g/dL (ref 30.0–36.0)
MCV: 86.3 fL (ref 80.0–100.0)
Platelets: 157 10*3/uL (ref 150–400)
RBC: 3 MIL/uL — ABNORMAL LOW (ref 3.87–5.11)
RDW: 16 % — ABNORMAL HIGH (ref 11.5–15.5)
WBC: 9.9 10*3/uL (ref 4.0–10.5)
nRBC: 0 % (ref 0.0–0.2)

## 2018-11-25 LAB — IRON AND TIBC
Iron: 23 ug/dL — ABNORMAL LOW (ref 28–170)
Saturation Ratios: 7 % — ABNORMAL LOW (ref 10.4–31.8)
TIBC: 339 ug/dL (ref 250–450)
UIBC: 316 ug/dL

## 2018-11-25 LAB — FERRITIN: Ferritin: 52 ng/mL (ref 11–307)

## 2018-11-25 LAB — MAGNESIUM: Magnesium: 1.8 mg/dL (ref 1.7–2.4)

## 2018-11-25 MED ORDER — LACTATED RINGERS IV BOLUS
500.0000 mL | Freq: Once | INTRAVENOUS | Status: AC
  Administered 2018-11-25: 500 mL via INTRAVENOUS

## 2018-11-25 MED ORDER — ONDANSETRON HCL 4 MG/2ML IJ SOLN: 4.0000 mg | Freq: Four times a day (QID) | INTRAMUSCULAR | Status: DC | PRN

## 2018-11-25 MED ORDER — POTASSIUM CHLORIDE CRYS ER 20 MEQ PO TBCR
40.0000 meq | EXTENDED_RELEASE_TABLET | Freq: Once | ORAL | Status: AC
  Administered 2018-11-25: 40 meq via ORAL
  Filled 2018-11-25: qty 2

## 2018-11-25 MED ORDER — MAGNESIUM SULFATE 2 GM/50ML IV SOLN
2.0000 g | Freq: Once | INTRAVENOUS | Status: AC
  Administered 2018-11-25: 2 g via INTRAVENOUS
  Filled 2018-11-25: qty 50

## 2018-11-25 MED ORDER — ENOXAPARIN SODIUM 40 MG/0.4ML ~~LOC~~ SOLN
40.0000 mg | SUBCUTANEOUS | Status: DC
  Administered 2018-11-25 – 2018-11-28 (×4): 40 mg via SUBCUTANEOUS
  Filled 2018-11-25 (×5): qty 0.4

## 2018-11-25 MED ORDER — CHLORHEXIDINE GLUCONATE CLOTH 2 % EX PADS
6.0000 | MEDICATED_PAD | Freq: Every day | CUTANEOUS | Status: DC
  Administered 2018-11-26: 6 via TOPICAL

## 2018-11-25 MED ORDER — LACTATED RINGERS IV SOLN: INTRAVENOUS | Status: DC | PRN

## 2018-11-25 MED ORDER — POTASSIUM CHLORIDE 10 MEQ/100ML IV SOLN
10.0000 meq | INTRAVENOUS | Status: DC
  Administered 2018-11-25: 10 meq via INTRAVENOUS
  Filled 2018-11-25: qty 100

## 2018-11-25 NOTE — Progress Notes (Addendum)
Patient ID: Courtney Colon, female   DOB: 1995/07/16, 23 y.o.   MRN: [id number] FHT assessed  Baseline back down to 130s/ + accels/ no decels/ moderate variability- cat I/ reactive Toco- uterine irritability noted-- no UCs reported by mother  Pt in ICU with urosepsis, E coli, overall no further deterioration. Continue close monitoring. Blood cultures pending

## 2018-11-25 NOTE — Progress Notes (Signed)
Lower extremity venous has been completed.   Preliminary results in CV Proc.   Abram Sander 11/25/2018 9:20 AM

## 2018-11-25 NOTE — Progress Notes (Addendum)
Pt's temp now 101.16F. Tylenol, ice packs and cold washcloths given. Pt endorses feeling chills.

## 2018-11-25 NOTE — Progress Notes (Signed)
After previous interventions for fever, pt's temp now 98.2. FHR currently at 145-150. Maternal heart rate still 120, but patient states she feels better.

## 2018-11-25 NOTE — Progress Notes (Addendum)
NAME:  Courtney Colon, MRN:  [id number], DOB:  1995/07/29, LOS: 2 ADMISSION DATE:  11/23/2018, CONSULTATION DATE:  11/25/18 REFERRING MD: Dr. Pamala Hurry , CHIEF COMPLAINT:  Code Sepsis  Brief History   23 year old female currently [redacted] weeks pregnant who presented to the emergency department yesterday with fever and abdominal pain.  UA consistent with UTI, she was admitted by OB and started on ceftriaxone, not in active labor.  Overnight, patient became increasingly tachycardic and blood pressure dropped to 79/57.  A code sepsis was called and PCCM consulted.  Has not required vasopressors.   Past Medical History   has a past medical history of Anemia and varicella.  UTIs during this pregnancy  Significant Hospital Events   10/28 Admit to [facility] 10/29 Code Sepsis; remains hypotensive and tachycardic after 5 liters of crystalloid. Moved to ICU.   Consults:  PCCM  Procedures:    Significant Diagnostic Tests:   Renal US 10/29> mildly worsened hydronephrosis  Micro Data:  BCx2>> Sars-Cov-2>>neg UC 10/28> E. Coli, susceptibilities pending  Antimicrobials:  Ceftriaxone 10/28-  Interim history/subjective:  Had headaches and dizziness with fevers, which improve with acetaminophen. Headache persists, but no vision changes or other neuro deficits. New left groin pain. Has b/l leg edema. Some nausea overnight.   Objective   Blood pressure (!) 96/50, pulse (!) 108, temperature 97.8 F (36.6 C), temperature source Oral, resp. rate (!) 22, weight 70.2 kg, SpO2 97 %, unknown if currently breastfeeding.        Intake/Output Summary (Last 24 hours) at 11/25/2018 0802 Last data filed at 11/25/2018 8676 Gross per 24 hour  Intake 3678.63 ml  Output 2675 ml  Net 1003.63 ml   Filed Weights   11/23/18 1712 11/25/18 0500  Weight: 69.2 kg 70.2 kg    General : young woman laying in bed in NAD, appears stated age HEENT: Normocephalic, atraumatic, eyes anicteric Cardio: Regular rate and rhythm, no  murmurs Respiratory: Mild resting tachypnea, no accessory muscle use.  Clear to auscultation bilaterally Abdomen: Gravid uterus, nondistended Extremities: Minimal bilateral lower extremity edema Derm: No rashes or wounds Neuro: Awake and alert, answering questions appropriately, moving all extremities spontaneously  Resolved Hospital Problem list   Abdominal pain  Assessment & Plan:   Severe Sepsis 2/2 pyelonephritis - E. coli on urine cultures Plan -Continue ceftriaxone daily -Continue to follow urine cultures for sensitivities -Continue to closely monitor blood pressure; she is perfusing well and tolerating low normal blood pressures; will use phenylephrine as needed -Continue to monitor on telemetry  Mild right hydronephrosis; unknown if there is a stone vs physiologic from gravid uterus. -Appreciate urology's assistance.  Agree with trying to lay on the left side. -Agree with avoiding CT scanning if she is continuing to improve.  Can defer CT scans until postpartum.  Fluid and electrolyte imbalance: hyponatremia and hypokalemia  Plan -Check magnesium level -Replete potassium orally  Mild metabolic acidosis-physiologic pregnancy from respiratory alkalosis.  Normal lactate levels. Plan -Continue to monitor  Pregnancy  Plan -36 weeks today; no contractions -Continue fetal monitoring; appreciate OB's assistance. Full time OB monitoring in the unit.  Normocytic anemia-worsened since admission, likely due to dilution.  Chronic anemia likely due to pregnancy. -Continue to monitor -Checking iron studies -Prenatal vitamin   Diet: as tolerated Code status: full Glycemic control: NA DVT prophylaxis lovenox Sedation: NA Vent: NA Family: Patient has been updated at bedside Dispo: ICU    This patient is critically ill with multiple organ system failure which requires frequent  high complexity decision making, assessment, support, evaluation, and titration of therapies. This  was completed through the application of advanced monitoring technologies and extensive interpretation of multiple databases. During this encounter critical care time was devoted to patient care services described in this note for 35 minutes.  12:17 PM Discussed with OB, who feels that Mrs. Maxham is stable to return to the floor.  Steffanie Dunn, DO 11/25/18 12:17 PM [facility]

## 2018-11-25 NOTE — Progress Notes (Signed)
Pt up to BR and MD at [facility] to assess pt

## 2018-11-25 NOTE — Progress Notes (Signed)
Subjective: Feeling better, dizziness resolved, ate regular diet, last fever chills last night at 7-8 pm, broke out in sweat this morning. Got up to the bathroom and is ready to get out of the ICU Still has bilateral flank pain.  C/o some UCs q 20 minutes, no LOF or vag bleeding. FMs good.   Objective: Vital signs in last 24 hours: Temp:  [97.8 F (36.6 C)-100.8 F (38.2 C)] 97.9 F (36.6 C) (10/30 1109) Pulse Rate:  [102-132] 108 (10/30 0700) Resp:  [20-22] 22 (10/29 1600) BP: (85-116)/(44-81) 96/50 (10/30 0700) SpO2:  [91 %-100 %] 97 % (10/30 0700) Weight:  [70.2 kg] 70.2 kg (10/30 0500) Weight change: 1.027 kg    Intake/Output from previous day: 10/29 0701 - 10/30 0700 In: 5420.3 [P.O.:1534; I.V.:3156.4; IV Piggyback:729.9] Out: 1875 [Urine:1875] Intake/Output this shift: Total I/O In: -  Out: 800 [Urine:800]  General appearance: alert and cooperative Resp: clear to auscultation bilaterally Cardio: regular rate and rhythm, S1, S2 normal, no murmur, click, rub or gallop and tachycardia slightly improved  GI: soft, non-tender; bowel sounds normal; no masses,  no organomegaly and gravid non tender uterus Extremities: Homans sign is negative, no sign of DVT and no edema, redness or tenderness in the calves or thighs Pulses: 2+ and symmetric Back- bilateral CVA tenderness   Lab Results: Recent Labs    11/24/18 0448 11/25/18 0238  WBC 14.8* 9.9  HGB 9.1* 8.0*  HCT 28.6* 25.9*  PLT 177 157   BMET Recent Labs    11/24/18 0448 11/25/18 0238  NA 132* 136  K 3.1* 3.4*  CL 105 114*  CO2 16* 16*  GLUCOSE 94 79  BUN <5* <5*  CREATININE 0.56 0.48  CALCIUM 7.8* 7.8*    Studies/Results: US Renal  Result Date: 11/24/2018 CLINICAL DATA:  Pyelonephritis complicating pregnancy. Additional history provided pyelonephritis, fever for 4 days, [redacted] weeks pregnant. EXAM: RENAL / URINARY TRACT ULTRASOUND COMPLETE COMPARISON:  Abdominal ultrasound 11/01/2018. FINDINGS: Right  Kidney: Renal measurements: 13.3 x 6.8 x 6.9 cm = volume: 325.6 mL. Heterogeneous parenchymal echogenicity consistent with edema and the provided history of pyelonephritis. Mild hydronephrosis appears slightly increased from prior exam. No shadowing calculus is seen. Left Kidney: Renal measurements: 11.1 x 5.8 x 5.9 cm = volume: 199.03 mL. Echogenicity within normal limits. No mass or hydronephrosis visualized. No shadowing calculus is seen. Bladder: Nonvisualized. These results will be called to the ordering clinician or representative by the Radiologist Assistant, and communication documented in the PACS or zVision Dashboard. IMPRESSION: 1. Heterogeneous right renal parenchymal echogenicity consistent with edema and the provided history of pyelonephritis. 2. Mild right hydronephrosis appears slightly increased from prior exam 11/01/2018. Electronically Signed   By: Jackey Loge DO   On: 11/24/2018 15:17   Dg Chest Port 1 View  Result Date: 11/24/2018 CLINICAL DATA:  Sepsis. EXAM: PORTABLE CHEST 1 VIEW COMPARISON:  None. FINDINGS: The cardiac silhouette, mediastinal and hilar contours are normal. The lungs are clear. No pleural effusions. The bony thorax is intact. IMPRESSION: No acute cardiopulmonary findings. Electronically Signed   By: Rudie Meyer M.D.   On: 11/24/2018 05:24   Ct Renal Stone Study  Result Date: 11/24/2018 CLINICAL DATA:  Right flank pain. Pyelonephritis. Rule out kidney stone. EXAM: CT ABDOMEN AND PELVIS WITHOUT CONTRAST TECHNIQUE: Multidetector CT imaging of the abdomen and pelvis was performed following the standard protocol without IV contrast. COMPARISON:  Ultrasound kidney 11/24/2018 FINDINGS: Lower chest: Mild left lower lobe atelectasis. No infiltrate or effusion. Hepatobiliary:  No focal liver abnormality is seen. No gallstones, gallbladder wall thickening, or biliary dilatation. Pancreas: Negative Spleen: Negative Adrenals/Urinary Tract: Mild enlargement of the right kidney  with mild hydronephrosis. No obstructing stone identified. Left kidney normal. No bladder abnormality. Stomach/Bowel: Negative for bowel obstruction. No bowel mass or edema. Appendix not visualized but no evidence of acute abnormality in the right lower quadrant. Vascular/Lymphatic: Negative Reproductive: Gravid uterus.  No fetal abnormality identified. Other: No free fluid or mass in the abdomen Musculoskeletal: Negative IMPRESSION: Enlargement of the right kidney with right hydronephrosis compatible with the given diagnosis of pyelonephritis. No obstructing stone identified. Gravid uterus without acute fetal abnormality. Electronically Signed   By: Franchot Gallo M.D.   On: 11/24/2018 18:37   Vas [place] Lower Extremity Venous (dvt)  Result Date: 11/25/2018  Lower Venous Study Indications: Swelling, and Pain.  Comparison Study: no prior Performing Technologist: Abram Sander RVS  Examination Guidelines: A complete evaluation includes B-mode imaging, spectral Doppler, color Doppler, and power Doppler as needed of all accessible portions of each vessel. Bilateral testing is considered an integral part of a complete examination. Limited examinations for reoccurring indications may be performed as noted.  +-----+---------------+---------+-----------+----------+--------------+ RIGHTCompressibilityPhasicitySpontaneityPropertiesThrombus Aging +-----+---------------+---------+-----------+----------+--------------+ CFV  Full           Yes      Yes                                 +-----+---------------+---------+-----------+----------+--------------+   +---------+---------------+---------+-----------+----------+--------------+ LEFT     CompressibilityPhasicitySpontaneityPropertiesThrombus Aging +---------+---------------+---------+-----------+----------+--------------+ CFV      Full           Yes      Yes                                  +---------+---------------+---------+-----------+----------+--------------+ SFJ      Full                                                        +---------+---------------+---------+-----------+----------+--------------+ FV Prox  Full                                                        +---------+---------------+---------+-----------+----------+--------------+ FV Mid   Full                                                        +---------+---------------+---------+-----------+----------+--------------+ FV DistalFull                                                        +---------+---------------+---------+-----------+----------+--------------+ PFV      Full                                                        +---------+---------------+---------+-----------+----------+--------------+  POP      Full           Yes      Yes                                 +---------+---------------+---------+-----------+----------+--------------+ PTV      Full                                                        +---------+---------------+---------+-----------+----------+--------------+ PERO     Full                                                        +---------+---------------+---------+-----------+----------+--------------+     Summary: Right: No evidence of common femoral vein obstruction. Left: There is no evidence of deep vein thrombosis in the lower extremity. No cystic structure found in the popliteal fossa.  *See table(s) above for measurements and observations.    Preliminary     Medications:  Scheduled: . Chlorhexidine Gluconate Cloth  6 each Topical Daily  . docusate sodium  100 mg Oral Daily  . enoxaparin (LOVENOX) injection  40 mg Subcutaneous Q24H  . prenatal multivitamin  1 tablet Oral Q1200   Continuous: . sodium chloride    . cefTRIAXone (ROCEPHIN)  IV Stopped (11/24/18 2035)  . famotidine (PEPCID) IV 20 mg (11/25/18 1146)  . lactated  ringers Stopped (11/25/18 0757)  . phenylephrine (NEO-SYNEPHRINE) Adult infusion     WGN:[id number]:acetaminophen, calcium carbonate, ondansetron (ZOFRAN) IV, zolpidem  Assessment/Plan:  LOS: 2 days  -Acute Urosepsis following pyelonephritis, urine growing E.coli, based on office sensitivity- continue Ceftriaxone daily IV x 4 days and if continues to improve then switch to oral Keflex 500mg  bid for 14 total days and then suppression daily Stable and improved-- transfer back to Antenatal unit  FWB- cat I tracing, if continues to be cat I, plan to switch to 1 hr monitoring every 4 hours if no maternal status change Continue Lovenox for VTE prophylaxis  Anemia - plan IV iron tomorrow if no new events  Plan f/up outpatient renal scan to assess improvement/ worsening hydronephrosis after discharge   Robley FriesVaishali R Xylia Scherger 11/25/2018, 12:09 PM Patient ID: Alver Sorrownita Treadway, female   DOB: 09/24/1995, 23 y.o.   MRN: [id number]

## 2018-11-25 NOTE — Progress Notes (Signed)
Dr. Murrell Redden notified regarding elevated FHR at 190s and MHR in 120s. Pt's temp is 99.4 currently without fever or chills. MD to look over fetal heart rate strip and pt's chart and then call back.

## 2018-11-25 NOTE — Progress Notes (Signed)
Patient transferred to [facility] unit at this time with [facility] RN via wheelchair with mask. VSS. Pt a/ox4. Partner escorted with patient. Belongings and chart information sent.

## 2018-11-25 NOTE — Progress Notes (Signed)
Pt up to [facility]

## 2018-11-26 LAB — CULTURE, OB URINE: Culture: >=100000 — AB

## 2018-11-26 MED ORDER — LACTATED RINGERS IV SOLN
Freq: Once | INTRAVENOUS | Status: AC
  Administered 2018-11-26: 01:00:00 via INTRAVENOUS

## 2018-11-26 MED ORDER — SODIUM CHLORIDE 0.9 % IV SOLN
INTRAVENOUS | Status: DC | PRN
  Administered 2018-11-28: 08:00:00 via INTRAVENOUS

## 2018-11-26 NOTE — Progress Notes (Signed)
Pt complaining of new onset cough that started this morning. Pt's husband in room states he also has a cough. Derrell Lolling CNM notified. Pt afebrile and spouse denies fever. Will continue to monitor for now since pt's previous covid test was negative and both pt and husband have been living together.

## 2018-11-26 NOTE — Progress Notes (Signed)
New IVF orders given by Dr. Murrell Redden. Will keep patient on continuous EFM until later today per MD.

## 2018-11-26 NOTE — Progress Notes (Signed)
Says feeling better; feels hot sometimes chills occ ctx, about 1-2 an hr, no change since admission, feel mild; no lof/vb, +FM Does c/o low back pain, occ left then occ right sided Not wearing scds, not sitting in chair yet, only has ambulated to void tol po, no n/v, no sob/cp  Temp:  [97.9 F (36.6 C)-101.4 F (38.6 C)] 98.2 F (36.8 C) (10/31 1224) Pulse Rate:  [94-124] 106 (10/31 1224) Resp:  [18-24] 19 (10/31 0743) BP: (105-120)/(58-93) 111/65 (10/31 1224) SpO2:  [97 %-99 %] 98 % (10/31 1224) Weight:  [75.8 kg] 75.8 kg (10/31 0620) tmax 101.4 last night, prior fever 24 before   Intake/Output Summary (Last 24 hours) at 11/26/2018 1501 Last data filed at 11/26/2018 1400 Gross per 24 hour  Intake 4675.43 ml  Output 2900 ml  Net 1775.43 ml   Results for orders placed or performed during the hospital encounter of 11/23/18 (from the past 48 hour(s))  Magnesium     Status: None   Collection Time: 11/25/18  2:28 AM  Result Value Ref Range   Magnesium 1.8 1.7 - 2.4 mg/dL    Comment: Performed at [facility], [address], [place], [place] [address]  CBC     Status: Abnormal   Collection Time: 11/25/18  2:38 AM  Result Value Ref Range   WBC 9.9 4.0 - 10.5 K/uL   RBC 3.00 (L) 3.87 - 5.11 MIL/uL   Hemoglobin 8.0 (L) 12.0 - 15.0 g/dL   HCT 46.6 (L) 59.9 - 35.7 %   MCV 86.3 80.0 - 100.0 fL   MCH 26.7 26.0 - 34.0 pg   MCHC 30.9 30.0 - 36.0 g/dL   RDW 01.7 (H) 79.3 - 90.3 %   Platelets 157 150 - 400 K/uL   nRBC 0.0 0.0 - 0.2 %    Comment: Performed at [facility], [address], [place], [place] [address]  Comprehensive metabolic panel     Status: Abnormal   Collection Time: 11/25/18  2:38 AM  Result Value Ref Range   Sodium 136 135 - 145 mmol/L   Potassium 3.4 (L) 3.5 - 5.1 mmol/L   Chloride 114 (H) 98 - 111 mmol/L   CO2 16 (L) 22 - 32 mmol/L   Glucose, Bld 79 70 - 99 mg/dL   BUN <5 (L) 6 - 20 mg/dL   Creatinine, Ser 3.00 0.44 - 1.00 mg/dL   Calcium 7.8  (L) 8.9 - 10.3 mg/dL   Total Protein 4.7 (L) 6.5 - 8.1 g/dL   Albumin 1.7 (L) 3.5 - 5.0 g/dL   AST 14 (L) 15 - 41 U/L   ALT 10 0 - 44 U/L   Alkaline Phosphatase 107 38 - 126 U/L   Total Bilirubin 0.5 0.3 - 1.2 mg/dL   GFR calc non Af Amer >60 >60 mL/min   GFR calc Af Amer >60 >60 mL/min   Anion gap 6 5 - 15    Comment: Performed at [facility], [address], [place], [place] [address]  Iron and TIBC     Status: Abnormal   Collection Time: 11/25/18  1:02 PM  Result Value Ref Range   Iron 23 (L) 28 - 170 ug/dL   TIBC 333 545 - 625 ug/dL   Saturation Ratios 7 (L) 10.4 - 31.8 %   UIBC 316 ug/dL    Comment: Performed at [facility], [address] [place], [place] [address]  Ferritin  Status: None   Collection Time: 11/25/18  1:02 PM  Result Value Ref Range   Ferritin 52 11 - 307 ng/mL    Comment: Performed at [facility], [place] [address] [place],  [address]   Urine culture: final, e.coli, sensitive to cephtriaxone Blood cultures: no growth (prelim)  A&ox3 rrr ctab Abd: soft, nt, gravid LE: tr edema, nt bilat Back: bilat cva tenderness Pelvic- deferred  FHT: 120s, nml variability, +accels, no decels; baseline was about 180s for about an hour just prior to her fever, then returned to a baseline of 150s, no other periods of tachycardia Toco: occ ctx  A/P:  iup at 36.1 wga with pyelonephritis/acute urosepsis 1. Improving, transferred from ICU yesterday; contin iv ceftriaxone, day #4 tonight; anticipate continued improvement and plan change to keflex tomorrow unless continues to have fever, would plan 14d oral keflex bid then q day for suppression; increased UOP and contin I/o, encourage oral intake but contin maintenance IVF until oral intake good; encourage ambulation, sitting in chair, scds when in bed; contin lovenox;  2.  Fetal status - reassuring, will d/c continuous monitoring and begin q 4 hr for 1 hr 3. Anemia - plan iv iron after afebrile   4. M/s low back pain - tylenol, heat ok, may improve if sitting up and changing positions more 5. Hydronephrosis - plan f/u as outpt

## 2018-11-27 MED ORDER — SODIUM CHLORIDE 0.9 % IV SOLN
510.0000 mg | Freq: Once | INTRAVENOUS | Status: AC
  Administered 2018-11-28: 510 mg via INTRAVENOUS
  Filled 2018-11-27: qty 17

## 2018-11-27 MED ORDER — SODIUM CHLORIDE 0.9 % IV SOLN: 510.0000 mg | Freq: Once | INTRAVENOUS | Status: DC

## 2018-11-27 MED ORDER — FAMOTIDINE 20 MG PO TABS
20.0000 mg | ORAL_TABLET | Freq: Every day | ORAL | Status: DC
  Administered 2018-11-27 – 2018-11-28 (×2): 20 mg via ORAL
  Filled 2018-11-27 (×2): qty 1

## 2018-11-27 MED ORDER — CEPHALEXIN 500 MG PO CAPS
500.0000 mg | ORAL_CAPSULE | Freq: Two times a day (BID) | ORAL | Status: DC
  Administered 2018-11-28: 09:00:00 500 mg via ORAL
  Filled 2018-11-27: qty 1

## 2018-11-27 NOTE — Progress Notes (Signed)
Derrell Lolling CNM called regarding increased frequency of patient's contractions.  Pt has had 7 contractions in the last 1.5 hrs. Pt is feeling the contractions in her abdomen down to her legs. FHR reassuring and no new orders at this time. Ok to remove fetal monitoring at this time.

## 2018-11-27 NOTE — Progress Notes (Addendum)
No c/o, tol po, has increased oral water intake; denies f/c/s Some ctx last night, not now; denies lof/vb; +FM Feeling good; no c/o pain today  Temp:  [97.6 F (36.4 C)-98.2 F (36.8 C)] 98.1 F (36.7 C) (11/01 0809) Pulse Rate:  [87-107] 87 (11/01 0809) Resp:  [18] 18 (11/01 0809) BP: (110-122)/(56-84) 116/63 (11/01 0809) SpO2:  [96 %-100 %] 96 % (11/01 0809) Weight:  [77.1 kg] 77.1 kg (11/01 0525)   Intake/Output Summary (Last 24 hours) at 11/27/2018 1113 Last data filed at 11/27/2018 0900 Gross per 24 hour  Intake 4702.34 ml  Output 3700 ml  Net 1002.34 ml    A&ox3 rrr ctab Abd: soft, nt, gravid LE: no edmea, nt bilat  CBC Latest Ref Rng & Units 11/25/2018 11/24/2018 11/23/2018  WBC 4.0 - 10.5 K/uL 9.9 14.8(H) 20.1(H)  Hemoglobin 12.0 - 15.0 g/dL 8.0(L) 9.1(L) 10.6(L)  Hematocrit 36.0 - 46.0 % 25.9(L) 28.6(L) 33.4(L)  Platelets 150 - 400 K/uL 157 177 220   CMP Latest Ref Rng & Units 11/25/2018 11/24/2018 11/23/2018  Glucose 70 - 99 mg/dL 79 94 83  BUN 6 - 20 mg/dL <5(L) <5(L) 5(L)  Creatinine 0.44 - 1.00 mg/dL 0.48 0.56 0.53  Sodium 135 - 145 mmol/L 136 132(L) 131(L)  Potassium 3.5 - 5.1 mmol/L 3.4(L) 3.1(L) 3.5  Chloride 98 - 111 mmol/L 114(H) 105 104  CO2 22 - 32 mmol/L 16(L) 16(L) 17(L)  Calcium 8.9 - 10.3 mg/dL 7.8(L) 7.8(L) 8.4(L)  Total Protein 6.5 - 8.1 g/dL 4.7(L) 6.0(L) 6.9  Total Bilirubin 0.3 - 1.2 mg/dL 0.5 0.7 1.1  Alkaline Phos 38 - 126 U/L 107 124 142(H)  AST 15 - 41 U/L 14(L) 16 15  ALT 0 - 44 U/L 10 9 10    FHT: 120, 130 baseline, +variability, no decels, +accels Toco: rare ctx, some irritability last night  Blood culture x2: no growth in 3 days, prelim report Urine culture: e.coli sensitive to cephtriaxone  A/P: iup at 36.2 wga 1. Pylenophritis, complicated with urosepsis: much improved and now over 24 hrs since last fever, plan last dose IV ceftriaxone today and then change to keflex tomorrow with plan for oral therapy x14 d and then daily  suppression; would plan f/u appt this week after d/c and schedule with MD and not CNM (pt aware) 2. Anemia- asymptomatic; plan IV iron tomorrow am before d/c home and f/u as outpatient 3. Fetal status reassuring - plan monitoring now q 8 hr x1 hr 4. lovenox for dvt prophylaxis, continue increased ambulation

## 2018-11-28 MED ORDER — CEPHALEXIN 500 MG PO CAPS: 500.0000 mg | ORAL_CAPSULE | Freq: Four times a day (QID) | ORAL | 0 refills | Status: AC

## 2018-11-28 NOTE — Progress Notes (Signed)
S: no complaint Feels better (+) FM  O: BP 132/74 (BP Location: Left Arm)   Pulse 73   Temp 98.1 F (36.7 C) (Oral)   Resp 18   Ht 5' (1.524 m)   Wt 76.2 kg   SpO2 99%   BMI 32.82 kg/m   Lungs clear to A  Cor RRR  back  Mild CVAT abd gravid non tender Pelvic deferred extr no edema or calf tenderness Tracing: baseline 125 (+) accel 150 rare ctx IMP: Pyelonephritis resolving IUP @ 36 3/7   wk P) d/c home  cont antibiotics Increase po fluid intake F/u  1 wk Labor prec

## 2018-11-28 NOTE — Discharge Summary (Signed)
Physician Discharge Summary  Patient ID: Courtney Colon MRN: [id number] DOB/AGE: [birth year] 23 y.o.  Admit date: 11/23/2018 Discharge date: 11/28/2018  Admission Diagnoses: pyelonephritis, IUP @ 35 5/7 weeks  Discharge Diagnoses: urosepsis, IUP @ 36 3/7 wk, right hydronephrosis. IDA affecting pregnancy  Active Problems:   Pyelonephritis complicating pregnancy   Sepsis (HCC) previous Cesarean section  Discharged Condition: stable  Hospital Course: pt presented on 10/28 with fever chills, back pain. Pt was dx with pyelonephritis. Pt was admitted and started on IV ceftriaxone. Pt became septic and was transferred to ICU where she remained until 10/30. Pt had zosyn added to her antibiotic regimen. Pt had renal CT scan and r/o for kidney stone. Right hydronephrosis noted. Urology consult was obtained. Pt was transferred back to antepartum unit and continued on the ceftriaxone .. she was switched to keflex. Pt had fetal monitoring and followed by OB. BC x 2 negative Pt was also noted to be anemic. Pt was given IV feraheme Consults: pulmonary/intensive care and urology  Significant Diagnostic Studies: labs:  CBC Latest Ref Rng & Units 11/25/2018 11/24/2018 11/23/2018  WBC 4.0 - 10.5 K/uL 9.9 14.8(H) 20.1(H)  Hemoglobin 12.0 - 15.0 g/dL 8.0(L) 9.1(L) 10.6(L)  Hematocrit 36.0 - 46.0 % 25.9(L) 28.6(L) 33.4(L)  Platelets 150 - 400 K/uL 157 177 220  , microbiology: blood culture: negative and urine culture: positive for E coli and radiology: CT scan: renal  Treatments: IV hydration, antibiotics: Zosyn, ceftriaxone and keflex and IV iron infusion  Discharge Exam: Blood pressure 132/74, pulse 73, temperature 98.1 F (36.7 C), temperature source Oral, resp. rate 18, height 5' (1.524 m), weight 76.2 kg, SpO2 99 %, unknown if currently breastfeeding. General appearance: alert, cooperative and no distress Back: mild CVAT Resp: clear to auscultation bilaterally Cardio: regular rate and rhythm, S1, S2  normal, no murmur, click, rub or gallop GI: gravid non tender Pelvic: deferred Extremities: no edema, redness or tenderness in the calves or thighs  Disposition: Discharge disposition: 01-Home or Self Care       Discharge Instructions    Activity as tolerated - No restrictions   Complete by: As directed    Call MD for:  persistant nausea and vomiting   Complete by: As directed    Call MD for:  temperature >100.4   Complete by: As directed    Diet general   Complete by: As directed    Discharge instructions   Complete by: As directed    Call if decreased fetal movement, vaginal bleeding, 6 or more ctx per hour     Allergies as of 11/28/2018   No Known Allergies     Medication List    STOP taking these medications   acetaminophen 325 MG tablet Commonly known as: Tylenol   benzocaine-Menthol 20-0.5 % Aero Commonly known as: DERMOPLAST   ibuprofen 800 MG tablet Commonly known as: ADVIL     TAKE these medications   cephALEXin 500 MG capsule Commonly known as: KEFLEX Take 1 capsule (500 mg total) by mouth 4 (four) times daily for 10 days.   coconut oil Oil Apply 1 application topically as needed.   iron polysaccharides 150 MG capsule Commonly known as: NIFEREX Take 1 capsule (150 mg total) by mouth daily.   magnesium hydroxide 400 MG/5ML suspension Commonly known as: MILK OF MAGNESIA Take 30 mLs by mouth daily as needed for mild constipation.   magnesium oxide 400 (241.3 Mg) MG tablet Commonly known as: MAG-OX Take 1 tablet (400 mg total) by mouth  daily.   prenatal multivitamin Tabs tablet Take 1 tablet by mouth daily at 12 noon.      Follow-up Information    Ob[facility]yn, [facility] Follow up in 1 week(s).   Contact information: [place] [address] [phone]        [place] .   Contact information: [address] [place] [address] [phone]          Signed: Marvene Staff 11/28/2018, 2:25 PM

## 2018-11-29 LAB — CULTURE, BLOOD (ROUTINE X 2)
Culture: NO GROWTH
Culture: NO GROWTH
Special Requests: ADEQUATE
Special Requests: ADEQUATE

## 2018-12-05 ENCOUNTER — Other Ambulatory Visit: Payer: Self-pay

## 2018-12-05 ENCOUNTER — Inpatient Hospital Stay (HOSPITAL_COMMUNITY): Payer: No Typology Code available for payment source | Admitting: Anesthesiology

## 2018-12-05 ENCOUNTER — Inpatient Hospital Stay (HOSPITAL_COMMUNITY)
Admission: AD | Admit: 2018-12-05 | Discharge: 2018-12-07 | DRG: 805 | Disposition: A | Discharge status: Home / Self Care | Payer: No Typology Code available for payment source | Attending: Obstetrics and Gynecology | Admitting: Obstetrics and Gynecology

## 2018-12-05 ENCOUNTER — Encounter (HOSPITAL_COMMUNITY): Payer: Self-pay

## 2018-12-05 DIAGNOSIS — D62 Acute posthemorrhagic anemia: Secondary | ICD-10-CM | POA: Diagnosis not present

## 2018-12-05 DIAGNOSIS — O26893 Other specified pregnancy related conditions, third trimester: Secondary | ICD-10-CM | POA: Diagnosis present

## 2018-12-05 DIAGNOSIS — Z3A37 37 weeks gestation of pregnancy: Secondary | ICD-10-CM | POA: Diagnosis not present

## 2018-12-05 DIAGNOSIS — B9620 Unspecified Escherichia coli [E. coli] as the cause of diseases classified elsewhere: Secondary | ICD-10-CM | POA: Diagnosis present

## 2018-12-05 DIAGNOSIS — B372 Candidiasis of skin and nail: Secondary | ICD-10-CM | POA: Diagnosis present

## 2018-12-05 DIAGNOSIS — O34219 Maternal care for unspecified type scar from previous cesarean delivery: Secondary | ICD-10-CM | POA: Diagnosis present

## 2018-12-05 DIAGNOSIS — O9081 Anemia of the puerperium: Secondary | ICD-10-CM | POA: Diagnosis not present

## 2018-12-05 HISTORY — DX: Infections of kidney in pregnancy, third trimester: O23.03

## 2018-12-05 LAB — CBC
HCT: 37.5 % (ref 36.0–46.0)
Hemoglobin: 11.5 g/dL — ABNORMAL LOW (ref 12.0–15.0)
MCH: 27.5 pg (ref 26.0–34.0)
MCHC: 30.7 g/dL (ref 30.0–36.0)
MCV: 89.7 fL (ref 80.0–100.0)
Platelets: 356 10*3/uL (ref 150–400)
RBC: 4.18 MIL/uL (ref 3.87–5.11)
RDW: 18.7 % — ABNORMAL HIGH (ref 11.5–15.5)
WBC: 14.7 10*3/uL — ABNORMAL HIGH (ref 4.0–10.5)
nRBC: 0 % (ref 0.0–0.2)

## 2018-12-05 LAB — RPR: RPR Ser Ql: NONREACTIVE

## 2018-12-05 LAB — TYPE AND SCREEN
ABO/RH(D): O POS
Antibody Screen: NEGATIVE

## 2018-12-05 LAB — GROUP B STREP BY PCR: Group B strep by PCR: NEGATIVE

## 2018-12-05 MED ORDER — DIBUCAINE (PERIANAL) 1 % EX OINT
1.0000 "application " | TOPICAL_OINTMENT | CUTANEOUS | Status: DC | PRN
  Administered 2018-12-06: 1 via RECTAL
  Filled 2018-12-05: qty 28

## 2018-12-05 MED ORDER — OXYTOCIN 40 UNITS IN NORMAL SALINE INFUSION - SIMPLE MED
2.5000 [IU]/h | INTRAVENOUS | Status: DC
  Administered 2018-12-05: 2.5 [IU]/h via INTRAVENOUS
  Filled 2018-12-05: qty 1000

## 2018-12-05 MED ORDER — LIDOCAINE HCL (PF) 1 % IJ SOLN
INTRAMUSCULAR | Status: DC | PRN
  Administered 2018-12-05: 10 mL via EPIDURAL

## 2018-12-05 MED ORDER — WITCH HAZEL-GLYCERIN EX PADS
1.0000 "application " | MEDICATED_PAD | CUTANEOUS | Status: DC | PRN
  Administered 2018-12-06: 1 via TOPICAL

## 2018-12-05 MED ORDER — POLYSACCHARIDE IRON COMPLEX 150 MG PO CAPS
150.0000 mg | ORAL_CAPSULE | Freq: Every day | ORAL | Status: DC
  Administered 2018-12-06 – 2018-12-07 (×2): 150 mg via ORAL
  Filled 2018-12-05 (×2): qty 1

## 2018-12-05 MED ORDER — FENTANYL-BUPIVACAINE-NACL 0.5-0.125-0.9 MG/250ML-% EP SOLN: 12.0000 mL/h | EPIDURAL | Status: DC | PRN

## 2018-12-05 MED ORDER — LIDOCAINE HCL (PF) 1 % IJ SOLN: 30.0000 mL | INTRAMUSCULAR | Status: DC | PRN

## 2018-12-05 MED ORDER — OXYTOCIN BOLUS FROM INFUSION
500.0000 mL | Freq: Once | INTRAVENOUS | Status: AC
  Administered 2018-12-05: 08:00:00 500 mL via INTRAVENOUS

## 2018-12-05 MED ORDER — SENNOSIDES-DOCUSATE SODIUM 8.6-50 MG PO TABS
2.0000 | ORAL_TABLET | ORAL | Status: DC
  Administered 2018-12-06 (×2): 2 via ORAL
  Filled 2018-12-05 (×2): qty 2

## 2018-12-05 MED ORDER — SIMETHICONE 80 MG PO CHEW
80.0000 mg | CHEWABLE_TABLET | ORAL | Status: DC | PRN
  Administered 2018-12-06: 80 mg via ORAL
  Filled 2018-12-05: qty 1

## 2018-12-05 MED ORDER — EPHEDRINE 5 MG/ML INJ: 10.0000 mg | INTRAVENOUS | Status: DC | PRN

## 2018-12-05 MED ORDER — LACTATED RINGERS IV SOLN
INTRAVENOUS | Status: DC
  Administered 2018-12-05: 07:00:00 via INTRAVENOUS

## 2018-12-05 MED ORDER — IBUPROFEN 600 MG PO TABS
600.0000 mg | ORAL_TABLET | Freq: Four times a day (QID) | ORAL | Status: DC
  Administered 2018-12-05 – 2018-12-07 (×9): 600 mg via ORAL
  Filled 2018-12-05 (×9): qty 1

## 2018-12-05 MED ORDER — CEPHALEXIN 500 MG PO CAPS
500.0000 mg | ORAL_CAPSULE | Freq: Four times a day (QID) | ORAL | Status: DC
  Filled 2018-12-05: qty 1

## 2018-12-05 MED ORDER — ACETAMINOPHEN 325 MG PO TABS: 650.0000 mg | ORAL_TABLET | ORAL | Status: DC | PRN

## 2018-12-05 MED ORDER — TRAMADOL HCL 50 MG PO TABS
50.0000 mg | ORAL_TABLET | Freq: Four times a day (QID) | ORAL | Status: DC | PRN
  Administered 2018-12-05 – 2018-12-06 (×3): 50 mg via ORAL
  Filled 2018-12-05 (×3): qty 1

## 2018-12-05 MED ORDER — COCONUT OIL OIL
1.0000 "application " | TOPICAL_OIL | Status: DC | PRN
  Administered 2018-12-06: 1 via TOPICAL

## 2018-12-05 MED ORDER — BENZOCAINE-MENTHOL 20-0.5 % EX AERO: 1.0000 "application " | INHALATION_SPRAY | CUTANEOUS | Status: DC | PRN

## 2018-12-05 MED ORDER — SODIUM CHLORIDE (PF) 0.9 % IJ SOLN
INTRAMUSCULAR | Status: DC | PRN
  Administered 2018-12-05: 12 mL/h via EPIDURAL

## 2018-12-05 MED ORDER — PHENYLEPHRINE 40 MCG/ML (10ML) SYRINGE FOR IV PUSH (FOR BLOOD PRESSURE SUPPORT): 80.0000 ug | PREFILLED_SYRINGE | INTRAVENOUS | Status: DC | PRN

## 2018-12-05 MED ORDER — CEPHALEXIN 500 MG PO CAPS
500.0000 mg | ORAL_CAPSULE | Freq: Four times a day (QID) | ORAL | Status: DC
  Administered 2018-12-05 – 2018-12-07 (×8): 500 mg via ORAL
  Filled 2018-12-05 (×7): qty 1

## 2018-12-05 MED ORDER — LACTATED RINGERS IV SOLN: 500.0000 mL | INTRAVENOUS | Status: DC | PRN

## 2018-12-05 MED ORDER — DIPHENHYDRAMINE HCL 50 MG/ML IJ SOLN: 12.5000 mg | INTRAMUSCULAR | Status: DC | PRN

## 2018-12-05 MED ORDER — SOD CITRATE-CITRIC ACID 500-334 MG/5ML PO SOLN: 30.0000 mL | ORAL | Status: DC | PRN

## 2018-12-05 MED ORDER — OXYCODONE-ACETAMINOPHEN 5-325 MG PO TABS: 2.0000 | ORAL_TABLET | ORAL | Status: DC | PRN

## 2018-12-05 MED ORDER — LACTATED RINGERS IV SOLN
500.0000 mL | Freq: Once | INTRAVENOUS | Status: AC
  Administered 2018-12-05: 07:00:00 500 mL via INTRAVENOUS

## 2018-12-05 MED ORDER — PHENYLEPHRINE 40 MCG/ML (10ML) SYRINGE FOR IV PUSH (FOR BLOOD PRESSURE SUPPORT)
PREFILLED_SYRINGE | INTRAVENOUS | Status: AC
  Filled 2018-12-05: qty 10

## 2018-12-05 MED ORDER — FENTANYL-BUPIVACAINE-NACL 0.5-0.125-0.9 MG/250ML-% EP SOLN
EPIDURAL | Status: AC
  Filled 2018-12-05: qty 250

## 2018-12-05 MED ORDER — FLEET ENEMA 7-19 GM/118ML RE ENEM: 1.0000 | ENEMA | RECTAL | Status: DC | PRN

## 2018-12-05 MED ORDER — MAGNESIUM OXIDE 400 (241.3 MG) MG PO TABS
400.0000 mg | ORAL_TABLET | Freq: Every day | ORAL | Status: DC
  Administered 2018-12-06 – 2018-12-07 (×2): 400 mg via ORAL
  Filled 2018-12-05 (×2): qty 1

## 2018-12-05 MED ORDER — ONDANSETRON HCL 4 MG/2ML IJ SOLN: 4.0000 mg | Freq: Four times a day (QID) | INTRAMUSCULAR | Status: DC | PRN

## 2018-12-05 MED ORDER — OXYCODONE-ACETAMINOPHEN 5-325 MG PO TABS: 1.0000 | ORAL_TABLET | ORAL | Status: DC | PRN

## 2018-12-05 MED ORDER — ACETAMINOPHEN 325 MG PO TABS
650.0000 mg | ORAL_TABLET | ORAL | Status: DC | PRN
  Administered 2018-12-05: 650 mg via ORAL
  Filled 2018-12-05: qty 2

## 2018-12-05 NOTE — Anesthesia Preprocedure Evaluation (Signed)
Anesthesia Evaluation  Patient identified by MRN, date of birth, ID band Patient awake    Reviewed: Allergy & Precautions, H&P , NPO status   Airway Mallampati: II   Neck ROM: full    Dental   Pulmonary neg pulmonary ROS,    breath sounds clear to auscultation       Cardiovascular negative cardio ROS   Rhythm:regular Rate:Normal     Neuro/Psych negative neurological ROS     GI/Hepatic negative GI ROS, Neg liver ROS,   Endo/Other  negative endocrine ROS  Renal/GU negative Renal ROS  negative genitourinary   Musculoskeletal   Abdominal Normal abdominal exam  (+)   Peds  Hematology  (+) anemia , hct 37.5, plt 356   Anesthesia Other Findings   Reproductive/Obstetrics (+) Pregnancy S/p 1 csection 2016 and 2 subsequent successful VBACs                             Anesthesia Physical  Anesthesia Plan  ASA: II and emergent  Anesthesia Plan: Epidural   Post-op Pain Management:    Induction:   PONV Risk Score and Plan:   Airway Management Planned: Natural Airway  Additional Equipment: None  Intra-op Plan:   Post-operative Plan:   Informed Consent: I have reviewed the patients History and Physical, chart, labs and discussed the procedure including the risks, benefits and alternatives for the proposed anesthesia with the patient or authorized representative who has indicated his/her understanding and acceptance.       Plan Discussed with:   Anesthesia Plan Comments:         Anesthesia Quick Evaluation

## 2018-12-05 NOTE — Anesthesia Procedure Notes (Signed)
Epidural Patient location during procedure: OB Start time: 12/05/2018 7:25 AM End time: 12/05/2018 7:38 AM  Staffing Anesthesiologist: Pervis Hocking, DO Performed: anesthesiologist   Preanesthetic Checklist Completed: patient identified, pre-op evaluation, timeout performed, IV checked, risks and benefits discussed and monitors and equipment checked  Epidural Patient position: sitting Prep: site prepped and draped and DuraPrep Patient monitoring: continuous pulse ox, blood pressure, heart rate and cardiac monitor Approach: midline Location: L3-L4 Injection technique: LOR air  Needle:  Needle type: Tuohy  Needle gauge: 17 G Needle length: 9 cm Needle insertion depth: 5 cm Catheter type: closed end flexible Catheter size: 19 Gauge Catheter at skin depth: 10 cm Test dose: negative  Assessment Sensory level: T8 Events: blood not aspirated, injection not painful, no injection resistance, negative IV test and no paresthesia  Additional Notes Patient identified. Risks/Benefits/Options discussed with patient including but not limited to bleeding, infection, nerve damage, paralysis, failed block, incomplete pain control, headache, blood pressure changes, nausea, vomiting, reactions to medication both or allergic, itching and postpartum back pain. Confirmed with bedside nurse the patient's most recent platelet count. Confirmed with patient that they are not currently taking any anticoagulation, have any bleeding history or any family history of bleeding disorders. Patient expressed understanding and wished to proceed. All questions were answered. Sterile technique was used throughout the entire procedure. Please see nursing notes for vital signs. Test dose was given through epidural catheter and negative prior to continuing to dose epidural or start infusion. Warning signs of high block given to the patient including shortness of breath, tingling/numbness in hands, complete motor  block, or any concerning symptoms with instructions to call for help. Patient was given instructions on fall risk and not to get out of bed. All questions and concerns addressed with instructions to call with any issues or inadequate analgesia.  Reason for block:procedure for pain

## 2018-12-05 NOTE — H&P (Signed)
OB ADMISSION/ HISTORY & PHYSICAL:  Admission Date: 12/05/2018  6:35 AM  Admit Diagnosis: active labor @ 37.4 weeks  Courtney Colon is a 23 y.o. female presenting for onset of labor at 0300.  Prenatal History: [id number]   EDC : 12/23/2018, Alternate EDD Entry  Prenatal care at [facility]  Primary Ob Provider: Helayne Seminole Prenatal course complicated by hx CS (1st pregnancy) - VBAC x 2, UTI, anemia, pyelonephritis (hospitalized 10/28 to 11/2)  Prenatal Labs: ABO, Rh: O positive Antibody: negative Rubella:   Immune RPR:   NR HBsAg:   Negative HIV:   NR GTT: 86 GBS:   PCR pending GC/CHL: negative  OB History  Gravida Para Term Preterm AB Living  4 3 3     3   SAB TAB Ectopic Multiple Live Births        0 3    # Outcome Date GA Lbr Len/2nd Weight Sex Delivery Anes PTL Lv  4 Current           3 Term 07/27/17 [redacted]w[redacted]d 04:20 / 00:32 4000 g F Vag-Spont EPI  LIV  2 Term 02/25/16 [redacted]w[redacted]d 07:16 / 01:46 3830 g M VBAC EPI  LIV  1 Term 01/11/15 [redacted]w[redacted]d 10:11 / 04:31 2985 g F CS-LTranv EPI  LIV     Birth Comments: Newborn Screen [redacted]w[redacted]d Barcode: JM#[id number] Date collected: 01/12/2015 Hgb-normal, FA    Medical / Surgical History: Past medical history:  Past Medical History:  Diagnosis Date  . Anemia   . Hx of varicella   . Pyelonephritis affecting pregnancy in third trimester     Past surgical history:  Past Surgical History:  Procedure Laterality Date  . CESAREAN SECTION N/A 01/11/2015   Procedure: CESAREAN SECTION;  Surgeon: 01/13/2015, DO;  Location: [facility];  Service: Obstetrics;  Laterality: N/A;  . MOUTH SURGERY  2011  . WISDOM TOOTH EXTRACTION     Family History:  Family History  Problem Relation Age of Onset  . Hypertension Mother     Social History:  reports that she has never smoked. She has never used smokeless tobacco. She reports that she does not drink alcohol or use drugs.  Allergies: Patient has no known allergies.   Current Medications at  time of admission:  Prior to Admission medications   Medication Sig Start Date End Date Taking? Authorizing Provider  coconut oil OIL Apply 1 application topically as needed. 07/29/17   09/29/17, CNM  iron polysaccharides (NIFEREX) 150 MG capsule Take 1 capsule (150 mg total) by mouth daily. 07/29/17   09/29/17, CNM  magnesium hydroxide (MILK OF MAGNESIA) 400 MG/5ML suspension Take 30 mLs by mouth daily as needed for mild constipation.    [provider]  magnesium oxide (MAG-OX) 400 (241.3 Mg) MG tablet Take 1 tablet (400 mg total) by mouth daily. 07/29/17   09/29/17, CNM  Prenatal Vit-Fe Fumarate-FA (PRENATAL MULTIVITAMIN) TABS tablet Take 1 tablet by mouth daily at 12 noon.    [provider]    Review of Systems: active FM onset of ctx @ 0300 currently every 2-3 minutes No LOF bloody show present  Physical Exam: VS: Blood pressure 116/81, pulse (!) 120, temperature 98.7 F (37.1 C), resp. rate 18, height 5' (1.524 m), weight 70 kg, SpO2 100 %, unknown if currently breastfeeding. alert and oriented, appears more comfortable (epidural in place) heart:RRR lung fields clear with ausculation abdomen soft and non-tender, non-distended  uterus gravid and non-tender / AGA  extremities: no edema  cervical exam: 8-9 / 100 / vtx / 0 station FHR: baseline rate 135 / variability moderate / accelerations + / no decelerations TOCO: Q 2-3  Assessment: 37.[redacted] weeks gestation active stage of labor FHR category 1  Plan:  Admit for birth Dr Ronita Hipps notified of admission / plan of care  Atkins, MSN, [facility] 12/05/2018, 8:03 AM

## 2018-12-05 NOTE — Anesthesia Postprocedure Evaluation (Signed)
Anesthesia Post Note  Patient: Courtney Colon  Procedure(s) Performed: AN AD [place]     Patient location during evaluation: Mother Baby Anesthesia Type: Epidural Level of consciousness: awake and alert Pain management: pain level controlled Vital Signs Assessment: post-procedure vital signs reviewed and stable Respiratory status: spontaneous breathing, nonlabored ventilation and respiratory function stable Cardiovascular status: stable Postop Assessment: no headache, no backache and epidural receding Anesthetic complications: no    Last Vitals:  Vitals:   12/05/18 1040 12/05/18 1215  BP: 111/80 115/70  Pulse: 100 98  Resp: 18 17  Temp: 36.9 C 36.7 C  SpO2: 99%     Last Pain:  Vitals:   12/05/18 1218  TempSrc:   PainSc: 0-No pain   Pain Goal: Patients Stated Pain Goal: 3 (12/05/18 0750)              Epidural/Spinal Function Cutaneous sensation: Able to Wiggle Toes (12/05/18 1218), Patient able to flex knees: Yes (12/05/18 1218), Patient able to lift hips off bed: Yes (12/05/18 1218), Back pain beyond tenderness at insertion site: No (12/05/18 1218), Progressively worsening motor and/or sensory loss: No (12/05/18 1218), Bowel and/or bladder incontinence post epidural: No (12/05/18 1218)  [name]

## 2018-12-05 NOTE — Lactation Note (Signed)
This note was copied from a baby's chart. Lactation Consultation Note  Patient Name: Girl Nadine Ryle Today's Date: 12/05/2018 Reason for consult: Initial assessment;Early term 70-38.6wks  P4 mother whose infant is now 98 hours old.  This is an ETI at 37+3 weeks.  Mother breast fed her other three children (ages 45, 2 and 3 years) for approximately 6 months each.  Baby was swaddled and asleep in mother's arms when I arrived.  Mother had no questions/concerns related to breast feeding.  She was familiar with feeding cues and hand expression and did not wish to review.  Colostrum container provided and milk storage times discussed.  Finger feeding demonstrated.  Encouraged to feed 8-12 times/24 hours or sooner if baby shows feeding cues.  Asked mother to call for latch assistance from her RN/LC if desired.  Mom made aware of O/P services, breastfeeding support groups, community resources, and our phone # for post-discharge questions.  Mother has a DEBP for home use.  Father present.   Maternal Data Formula Feeding for Exclusion: Yes Reason for exclusion: Mother's choice to formula and breast feed on admission Has patient been taught Hand Expression?: Yes Does the patient have breastfeeding experience prior to this delivery?: Yes  Feeding Feeding Type: Breast Fed  LATCH Score                   Interventions    Lactation Tools Discussed/Used     Consult Status Consult Status: Follow-up Date: 12/06/18 Follow-up type: In-patient    Nalanie Winiecki R Oddie Bottger 12/05/2018, 5:01 PM

## 2018-12-05 NOTE — MAU Note (Signed)
Pt reports to MAU for ctx that's started 2-3 hours ago, pt states they are now every minute or two and there is some bleeding, and baby is moving.

## 2018-12-06 LAB — CBC
HCT: 33 % — ABNORMAL LOW (ref 36.0–46.0)
Hemoglobin: 10.2 g/dL — ABNORMAL LOW (ref 12.0–15.0)
MCH: 27.9 pg (ref 26.0–34.0)
MCHC: 30.9 g/dL (ref 30.0–36.0)
MCV: 90.2 fL (ref 80.0–100.0)
Platelets: 300 10*3/uL (ref 150–400)
RBC: 3.66 MIL/uL — ABNORMAL LOW (ref 3.87–5.11)
RDW: 18.7 % — ABNORMAL HIGH (ref 11.5–15.5)
WBC: 14.5 10*3/uL — ABNORMAL HIGH (ref 4.0–10.5)
nRBC: 0 % (ref 0.0–0.2)

## 2018-12-06 NOTE — Progress Notes (Addendum)
PPD #1, VBAC, intact, baby girl "Josie"  S:  Reports feeling tired, did not sleep much overnight; c/o mild cramping             Tolerating po/ No nausea or vomiting / slight dizziness but thinks it may be fatigue related, No SOB             Bleeding is getting lighter             Pain controlled with Motrin             Up ad lib / ambulatory / voiding QS  Newborn breast feeding - going okay; has difficulty getting baby latched   O:               VS: BP 98/72 (BP Location: Right Arm)   Pulse 85   Temp 98 F (36.7 C) (Oral)   Resp 16   Ht 5' (1.524 m)   Wt 70 kg   SpO2 100%   Breastfeeding Unknown   BMI 30.14 kg/m    LABS:              Recent Labs    12/05/18 0703 12/06/18 0551  WBC 14.7* 14.5*  HGB 11.5* 10.2*  PLT 356 300               Blood type: --/--/O POS (11/09 0703)  Rubella:                       I&O: Intake/Output      11/09 0701 - 11/10 0700 11/10 0701 - 11/11 0700   Urine (mL/kg/hr) 200 (0.1)    Blood 100    Total Output 300    Net -300         Urine Occurrence 1 x                  Physical Exam:             Alert and oriented X3, flat affect   Lungs: Clear and unlabored  Heart: regular rate and rhythm / no murmurs  Abdomen: soft, non-tender, non-distended              Fundus: firm, non-tender, U-2  Perineum: intact, no edema  Lochia: small amount; pad recently changed   Extremities: no edema, no calf pain or tenderness    A/P: PPD # 1, VBAC  ABL Anemia compounding chronic IDA   - stable on oral FE  Hx. Of pyelonephritis and urosepsis   - Continue Keflex 500mg  QID x 10 days (on day 8/10), then reduce to BID for 6 weeks PP   Doing well - stable status  Routine post partum orders  Continue working on lactation  D/C IV today  Encouraged to rest when baby rests  Anticipate d/c home tomorrow   Consult for plan: Dr. James Town Sink, MSN, CNM [facility]

## 2018-12-06 NOTE — Lactation Note (Addendum)
This note was copied from a baby's chart. Lactation Consultation Note  Patient Name: Courtney Colon UXNAT'F Date: 12/06/2018 Reason for consult: Follow-up assessment;Early term 37-38.6wks;Infant weight loss P4, 37 hour ,ETI female infant with weight loss -3%. Mom requesting larger breast  flange a  size 30 mm  instead of 27 mm with manual  Harmony hand pump, LC in agreement when assessed mom's nipple size.  Per mom, unsure of exactly how many void  diapers she thinks 2 voids and stools 3 since birth. Parents will start documenting all voids and stools for accuracy of input and out put of infant on yellow sheet given.  LC entered room mom was breastfeeding infant on right breast using cross cradle hold with no pillow support with her knees up in the air, infant cloth and swaddled in blankets was sleep at breast . Per mom, infant been breastfeeding 10 minutes or less most feedings. Mom receptive LC suggestions, LC  discussed with mom to do STS while breastfeeding infant, tips to keep infant stimulated and awake while breastfeeding for longer feedings. Mom undressed infant and re-latched infant with pillow support, infant was active sucking, nose and chin touching breast and was still breastfeeding after 10 minutes when [place] left room. LC reviewed breastfeed infant according hunger cues, 8 to 12 times within 24 hours and on demand while infant is cluster feeding after 25 hours of life. Mom knows to call Nurse or [place] if she has any questions, concerns or need assistance with latching infant to breast.   Maternal Data    Feeding Feeding Type: Breast Fed  LATCH Score Latch: Grasps breast easily, tongue down, lips flanged, rhythmical sucking.  Audible Swallowing: Spontaneous and intermittent  Type of Nipple: Everted at rest and after stimulation  Comfort (Breast/Nipple): Soft / non-tender  Hold (Positioning): Assistance needed to correctly position infant at breast and maintain latch.  LATCH  Score: 9  Interventions Interventions: Support pillows  Lactation Tools Discussed/Used     Consult Status Consult Status: Follow-up Date: 12/07/18 Follow-up type: In-patient    Vicente Serene 12/06/2018, 9:14 PM

## 2018-12-07 DIAGNOSIS — B372 Candidiasis of skin and nail: Secondary | ICD-10-CM | POA: Diagnosis not present

## 2018-12-07 MED ORDER — TRAMADOL HCL 50 MG PO TABS: 50.0000 mg | ORAL_TABLET | Freq: Four times a day (QID) | ORAL | 0 refills | Status: AC | PRN

## 2018-12-07 MED ORDER — COCONUT OIL OIL: 1.0000 "application " | TOPICAL_OIL | 0 refills | Status: DC | PRN

## 2018-12-07 MED ORDER — CEPHALEXIN 500 MG PO CAPS: 500.0000 mg | ORAL_CAPSULE | Freq: Two times a day (BID) | ORAL | 0 refills | Status: AC

## 2018-12-07 MED ORDER — ACETAMINOPHEN 500 MG PO TABS: 1000.0000 mg | ORAL_TABLET | Freq: Four times a day (QID) | ORAL | 2 refills | Status: AC | PRN

## 2018-12-07 MED ORDER — NYSTATIN-TRIAMCINOLONE 100000-0.1 UNIT/GM-% EX OINT: TOPICAL_OINTMENT | Freq: Two times a day (BID) | CUTANEOUS | 0 refills | Status: DC

## 2018-12-07 MED ORDER — FLUCONAZOLE 150 MG PO TABS: 150.0000 mg | ORAL_TABLET | ORAL | 1 refills | Status: AC

## 2018-12-07 MED ORDER — NYSTATIN-TRIAMCINOLONE 100000-0.1 UNIT/GM-% EX OINT
TOPICAL_OINTMENT | Freq: Two times a day (BID) | CUTANEOUS | Status: DC
  Administered 2018-12-07: 12:00:00 via TOPICAL
  Filled 2018-12-07 (×2): qty 15

## 2018-12-07 MED ORDER — IBUPROFEN 600 MG PO TABS: 600.0000 mg | ORAL_TABLET | Freq: Four times a day (QID) | ORAL | 0 refills | Status: DC

## 2018-12-07 MED ORDER — BENZOCAINE-MENTHOL 20-0.5 % EX AERO: 1.0000 "application " | INHALATION_SPRAY | CUTANEOUS | Status: DC | PRN

## 2018-12-07 NOTE — Lactation Note (Signed)
This note was copied from a baby's chart. Lactation Consultation Note  Patient Name: Courtney Colon SKAJG'O Date: 12/07/2018 Reason for consult: Follow-up assessment;Infant weight loss;Early term 68-38.6wks  46 hours old ETI female who is being partially BF and formula fed by her mother, she's a P4 and experinced BF. Mom and baby are going home today, baby is at 5% weight loss. Per mom BF is going well, she had LATCH scores of 9 and 10 last night. Mom using # 30 flange with her hand pump. Noticed that parents are doing large volumes of Gerber formula, last documented feeding was 75 ml.  Reviewed formula supplementation guidelines, discharge instructions, engorgement prevention/treatment and treatment/prevention for sore nipples. Parents reported all questions and concerns were answered, they're both aware of [facility] OP services and will call contact PRN.  Maternal Data    Feeding    LATCH Score                   Interventions Interventions: Breast feeding basics reviewed  Lactation Tools Discussed/Used     Consult Status Consult Status: Complete Date: 12/07/18 Follow-up type: Call as needed    Mckennon Zwart Francene Boyers 12/07/2018, 3:50 PM

## 2018-12-07 NOTE — Progress Notes (Signed)
Post Partum Day #2  S/P VBAC x 3 Live born female  Birth Weight: 6 lb 12.6 oz (3080 g) APGAR: 9, 10  Newborn Delivery   Birth date/time: 12/05/2018 08:14:00 Delivery type: VBAC, Spontaneous     Named Courtney Colon Delivering provider: Artelia Laroche  Feeding: breast and bottle  Pain control at delivery: Epidural   Subjective: No HA, SOB, CP, breast symptoms.  Pain - cramping, Ultram and ibuprofen helping.  Normal vaginal bleeding, no clots.   Voiding freely.  C/O burning rash in both groins, noted since yesterday.  Objective:  VS:  Vitals:   12/06/18 0554 12/06/18 1445 12/06/18 2159 12/07/18 0513  BP: 98/72 116/74 115/81 102/67  Pulse: 85 85 88 68  Resp: 16 16 18 18   Temp: 98 F (36.7 C) 98 F (36.7 C) 98.3 F (36.8 C) 98.3 F (36.8 C)  TempSrc: Oral Oral Oral Oral  SpO2: 100% 100% 98% 100%  Weight:      Height:        No intake or output data in the 24 hours ending 12/07/18 1015    Recent Labs    12/05/18 0703 12/06/18 0551  WBC 14.7* 14.5*  HGB 11.5* 10.2*  HCT 37.5 33.0*  PLT 356 300    Blood type: --/--/O POS (11/09 0703) Rubella:   immune Vaccines: TDaP ITD         Flu    UTD  Physical Exam:  General: alert, cooperative and no distress Uterine Fundus: firm Lochia: appropriate Perineum: intact, no edema Inguinal rash bilateral erythema, macular and induration of skin c/w yeast DVT Evaluation: No cords or calf tenderness. No significant calf/ankle edema.    Assessment/Plan: PPD # 2 / 23 y.o., [id number]    Principal Problem:   Postpartum care following vbac (11/9) Active Problems:   Normal labor Yeast of skin - inguinal, likely d/t prolonged ABX exposure  - Fluconazole 150 mg PO q 72 hrs x 3 doses  - topical nystatin/triamcinolone BID x 14 days  - daily probiotics    normal postpartum exam  Continue current postpartum care             DC home today w/ instructions  F/U at [place] in 6 weeks and PRN   LOS: 2 days   [name],  CNM, MSN 12/07/2018, 10:15 AM

## 2018-12-07 NOTE — Discharge Summary (Addendum)
OB Discharge Summary  Patient Name: Courtney Colon DOB: 1995-07-30 MRN: [id number]  Date of admission: 12/05/2018 Delivering provider: Artelia Laroche   Date of discharge: 12/07/2018  Admitting diagnosis: 37WKS CTX Intrauterine pregnancy: [redacted]w[redacted]d     Secondary diagnosis:Principal Problem:   Postpartum care following vbac (11/9) Active Problems:   Normal labor   Yeast infection of the skin  Additional problems:s/p urosepsis/pyelonephritis at 35.5 wks    Discharge diagnosis:  Patient Active Problem List   Diagnosis Date Noted  . VBAC x3 07/27/2017    Priority: Medium  . Yeast infection of the skin 12/07/2018  . Normal labor 12/05/2018  . Postpartum care following vbac (11/9) 12/05/2018  . Sepsis ([place]) 11/24/2018  . Pyelonephritis complicating pregnancy 27/06/2374  . Previous cesarean section complicating pregnancy (cs 1st pregnancy - VBAC x 3) 02/06/2016                                                                Post partum procedures:none  Augmentation: none Pain control: Epidural  Laceration:None  Episiotomy:None  Complications: None   Hospital course:  Onset of Labor With Vaginal Delivery     23 y.o. yo [id number] at [redacted]w[redacted]d was admitted in Active Labor on 12/05/2018. Patient had an uncomplicated labor course as follows:  Membrane Rupture Time/Date: 7:57 AM ,12/05/2018   Intrapartum Procedures: Episiotomy: None [1]                                         Lacerations:  None [1]  Patient had a delivery of a Viable infant. 12/05/2018  Information for the patient's newborn:  Ezmeralda, Stefanick [[id number]]  Delivery Method: VBAC, Spontaneous(Filed from Delivery Summary)     Pateint had an uncomplicated postpartum course.  She is ambulating, tolerating a regular diet, passing flatus, and urinating well. Patient is discharged home in stable condition on 12/07/18.   Physical exam  Vitals:   12/06/18 0554 12/06/18 1445 12/06/18 2159 12/07/18 0513  BP: 98/72 116/74 115/81 102/67   Pulse: 85 85 88 68  Resp: 16 16 18 18   Temp: 98 F (36.7 C) 98 F (36.7 C) 98.3 F (36.8 C) 98.3 F (36.8 C)  TempSrc: Oral Oral Oral Oral  SpO2: 100% 100% 98% 100%  Weight:      Height:       General: alert, cooperative and no distress Lochia: appropriate Uterine Fundus: firm Incision: N/A DVT Evaluation: No cords or calf tenderness. No significant calf/ankle edema. Labs: Lab Results  Component Value Date   WBC 14.5 (H) 12/06/2018   HGB 10.2 (L) 12/06/2018   HCT 33.0 (L) 12/06/2018   MCV 90.2 12/06/2018   PLT 300 12/06/2018   CMP Latest Ref Rng & Units 11/25/2018  Glucose 70 - 99 mg/dL 79  BUN 6 - 20 mg/dL <5(L)  Creatinine 0.44 - 1.00 mg/dL 0.48  Sodium 135 - 145 mmol/L 136  Potassium 3.5 - 5.1 mmol/L 3.4(L)  Chloride 98 - 111 mmol/L 114(H)  CO2 22 - 32 mmol/L 16(L)  Calcium 8.9 - 10.3 mg/dL 7.8(L)  Total Protein 6.5 - 8.1 g/dL 4.7(L)  Total Bilirubin 0.3 - 1.2 mg/dL 0.5  Alkaline Phos 38 - 126  U/L 107  AST 15 - 41 U/L 14(L)  ALT 0 - 44 U/L 10    Vaccines: TDaP UTD         Flu    UTS  Discharge instruction: per After Visit Summary and "Baby and Me Booklet".  After Visit Meds:  Allergies as of 12/07/2018   No Known Allergies     Medication List    TAKE these medications   acetaminophen 500 MG tablet Commonly known as: TYLENOL Take 2 tablets (1,000 mg total) by mouth every 6 (six) hours as needed.   benzocaine-Menthol 20-0.5 % Aero Commonly known as: DERMOPLAST Apply 1 application topically as needed for irritation (perineal discomfort).   cephALEXin 500 MG capsule Commonly known as: KEFLEX Take 1 capsule (500 mg total) by mouth 4 (four) times daily for 10 days. What changed: Another medication with the same name was added. Make sure you understand how and when to take each.   cephALEXin 500 MG capsule Commonly known as: KEFLEX Take 1 capsule (500 mg total) by mouth 2 (two) times daily. Continued extended treatment following episode of  urosepsis during pregnancy. Start taking on: December 09, 2018 What changed: You were already taking a medication with the same name, and this prescription was added. Make sure you understand how and when to take each.   coconut oil Oil Apply 1 application topically as needed.   fluconazole 150 MG tablet Commonly known as: Diflucan Take 1 tablet (150 mg total) by mouth every 3 (three) days for 3 doses.   ibuprofen 600 MG tablet Commonly known as: ADVIL Take 1 tablet (600 mg total) by mouth every 6 (six) hours.   iron polysaccharides 150 MG capsule Commonly known as: NIFEREX Take 1 capsule (150 mg total) by mouth daily.   magnesium hydroxide 400 MG/5ML suspension Commonly known as: MILK OF MAGNESIA Take 30 mLs by mouth daily as needed for mild constipation.   magnesium oxide 400 (241.3 Mg) MG tablet Commonly known as: MAG-OX Take 1 tablet (400 mg total) by mouth daily.   nystatin-triamcinolone ointment Commonly known as: MYCOLOG Apply topically 2 (two) times daily.   prenatal multivitamin Tabs tablet Take 1 tablet by mouth daily at 12 noon.   traMADol 50 MG tablet Commonly known as: ULTRAM Take 1 tablet (50 mg total) by mouth every 6 (six) hours as needed for up to 3 days for moderate pain.            Discharge Care Instructions  (From admission, onward)         Start     Ordered   12/07/18 0000  Discharge wound care:    Comments: Sitz baths 2 times /day with warm water x 1 week. May add herbals: 1 ounce dried comfrey leaf* 1 ounce calendula flowers 1 ounce lavender flowers 1/2 ounce dried uva ursi leaves 1/2 ounce witch hazel blossoms (if you can find them) 1/2 ounce dried sage leaf 1/2 cup sea salt Directions: Bring 2 quarts of water to a boil. Turn off heat, and place 1 ounce (approximately 1 large handful) of the above mixed herbs (not the salt) into the pot. Steep, covered, for 30 minutes.  Strain the liquid well with a fine mesh strainer, and discard  the herb material. Add 2 quarts of liquid to the tub, along with the 1/2 cup of salt. This medicinal liquid can also be made into compresses and peri-rinses.   12/07/18 0946          Diet:  routine diet  Activity: Advance as tolerated. Pelvic rest for 6 weeks.   Postpartum contraception: to be addressed at postpartum visit  Newborn Data: Live born female  Birth Weight: 6 lb 12.6 oz (3080 g) APGAR: 9, 10  Newborn Delivery   Birth date/time: 12/05/2018 08:14:00 Delivery type: VBAC, Spontaneous      named Josie Baby Feeding: Breast Disposition:home with mother   Delivery Report:   Review the Delivery Report for details.    Follow up: Follow-up Information    Neta Mendsaul, Marolyn Urschel C, CNM. Schedule an appointment as soon as possible for a visit in 6 week(s).   Specialty: Obstetrics and Gynecology Contact information: [address] [place] [address] [phone]             Signed: Cipriano MileDaniela C Stpehen Petitjean, CNM, MSN 12/07/2018, 10:38 AM

## 2019-01-27 NOTE — L&D Delivery Note (Signed)
Delivery Note:   [id number] at [redacted]w[redacted]d  Admitting diagnosis: Normal labor [O80, Z37.9] Risks: TOLAC Onset of labor: 0600 IOL/Augmentation: AROM ROM: AROM clear fluid at 0924  Complete dilation at 01/21/2020  1043 Onset of pushing at 1050 FHR second stage Cat I  Analgesia /Anesthesia intrapartum:Epidural  Pushing in lithotomy position with CNM and L&D staff support, Juan, FOB, present for birth and supportive.  Delivery of a Live born female  Birth Weight:  3399gm, 7lb 7.9oz APGAR: 9, 9  Newborn Delivery   Birth date/time: 01/21/2020 10:51:00 Delivery type: VBAC, Spontaneous      in cephalic presentation, position OA to ROA.  APGAR:1 min-9 , 5 min-9   Nuchal Cord: No  Cord double clamped after cessation of pulsation, cut by [place].  Collection of cord blood for typing completed. Cord blood donation-None  Arterial cord blood sample-No    Placenta delivered-Spontaneous  with 3 vessels . Uterotonics: Pitocin Placenta to L&D Uterine tone firm, U-1  Bleeding stable  None  laceration identified.  Episiotomy:None  Local analgesia: N/A  Repair: N/A Est. Blood Loss (mL):150.00   Complications: None  Mom to postpartum. Donald Siva to Couplet care / Skin to Skin.  Delivery Report:  Review the Delivery Report for details.    June Leap, CNM, MSN 01/21/2020, 11:08 AM

## 2020-01-21 ENCOUNTER — Encounter (HOSPITAL_COMMUNITY): Payer: Self-pay | Admitting: Obstetrics and Gynecology

## 2020-01-21 ENCOUNTER — Other Ambulatory Visit: Payer: Self-pay

## 2020-01-21 ENCOUNTER — Inpatient Hospital Stay (HOSPITAL_COMMUNITY)
Admission: AD | Admit: 2020-01-21 | Discharge: 2020-01-22 | DRG: 807 | Disposition: A | Discharge status: Home / Self Care | Payer: Medicaid Other | Attending: Obstetrics and Gynecology | Admitting: Obstetrics and Gynecology

## 2020-01-21 ENCOUNTER — Inpatient Hospital Stay (HOSPITAL_COMMUNITY): Payer: Medicaid Other | Admitting: Anesthesiology

## 2020-01-21 DIAGNOSIS — Z3A38 38 weeks gestation of pregnancy: Secondary | ICD-10-CM

## 2020-01-21 DIAGNOSIS — O34219 Maternal care for unspecified type scar from previous cesarean delivery: Secondary | ICD-10-CM | POA: Diagnosis present

## 2020-01-21 DIAGNOSIS — Z20822 Contact with and (suspected) exposure to covid-19: Secondary | ICD-10-CM | POA: Diagnosis present

## 2020-01-21 DIAGNOSIS — D649 Anemia, unspecified: Secondary | ICD-10-CM | POA: Diagnosis present

## 2020-01-21 DIAGNOSIS — O9902 Anemia complicating childbirth: Secondary | ICD-10-CM | POA: Diagnosis present

## 2020-01-21 DIAGNOSIS — Z87440 Personal history of urinary (tract) infections: Secondary | ICD-10-CM

## 2020-01-21 DIAGNOSIS — O26893 Other specified pregnancy related conditions, third trimester: Secondary | ICD-10-CM | POA: Diagnosis present

## 2020-01-21 DIAGNOSIS — Z8759 Personal history of other complications of pregnancy, childbirth and the puerperium: Secondary | ICD-10-CM

## 2020-01-21 LAB — RPR: RPR Ser Ql: NONREACTIVE

## 2020-01-21 LAB — CBC
HCT: 37.8 % (ref 36.0–46.0)
Hemoglobin: 12 g/dL (ref 12.0–15.0)
MCH: 28.9 pg (ref 26.0–34.0)
MCHC: 31.7 g/dL (ref 30.0–36.0)
MCV: 91.1 fL (ref 80.0–100.0)
Platelets: 205 10*3/uL (ref 150–400)
RBC: 4.15 MIL/uL (ref 3.87–5.11)
RDW: 13.3 % (ref 11.5–15.5)
WBC: 10.6 10*3/uL — ABNORMAL HIGH (ref 4.0–10.5)
nRBC: 0 % (ref 0.0–0.2)

## 2020-01-21 LAB — TYPE AND SCREEN
ABO/RH(D): O POS
Antibody Screen: NEGATIVE

## 2020-01-21 LAB — RESP PANEL BY RT-PCR (FLU A&B, COVID) ARPGX2
Influenza A by PCR: NEGATIVE
Influenza B by PCR: NEGATIVE
SARS Coronavirus 2 by RT PCR: NEGATIVE

## 2020-01-21 MED ORDER — DIBUCAINE (PERIANAL) 1 % EX OINT: 1.0000 "application " | TOPICAL_OINTMENT | CUTANEOUS | Status: DC | PRN

## 2020-01-21 MED ORDER — PHENYLEPHRINE 40 MCG/ML (10ML) SYRINGE FOR IV PUSH (FOR BLOOD PRESSURE SUPPORT): 80.0000 ug | PREFILLED_SYRINGE | INTRAVENOUS | Status: DC | PRN

## 2020-01-21 MED ORDER — DIPHENHYDRAMINE HCL 25 MG PO CAPS: 25.0000 mg | ORAL_CAPSULE | Freq: Four times a day (QID) | ORAL | Status: DC | PRN

## 2020-01-21 MED ORDER — ONDANSETRON HCL 4 MG PO TABS: 4.0000 mg | ORAL_TABLET | ORAL | Status: DC | PRN

## 2020-01-21 MED ORDER — DIPHENHYDRAMINE HCL 50 MG/ML IJ SOLN: 12.5000 mg | INTRAMUSCULAR | Status: DC | PRN

## 2020-01-21 MED ORDER — EPHEDRINE 5 MG/ML INJ: 10.0000 mg | INTRAVENOUS | Status: DC | PRN

## 2020-01-21 MED ORDER — WITCH HAZEL-GLYCERIN EX PADS: 1.0000 "application " | MEDICATED_PAD | CUTANEOUS | Status: DC | PRN

## 2020-01-21 MED ORDER — LIDOCAINE HCL (PF) 1 % IJ SOLN
INTRAMUSCULAR | Status: DC | PRN
  Administered 2020-01-21 (×2): 4 mL via EPIDURAL

## 2020-01-21 MED ORDER — LACTATED RINGERS IV SOLN: INTRAVENOUS | Status: DC

## 2020-01-21 MED ORDER — IBUPROFEN 600 MG PO TABS
600.0000 mg | ORAL_TABLET | Freq: Four times a day (QID) | ORAL | Status: DC
  Administered 2020-01-21 – 2020-01-22 (×5): 600 mg via ORAL
  Filled 2020-01-21 (×5): qty 1

## 2020-01-21 MED ORDER — OXYTOCIN 10 UNIT/ML IJ SOLN: 10.0000 [IU] | Freq: Once | INTRAMUSCULAR | Status: DC

## 2020-01-21 MED ORDER — OXYTOCIN BOLUS FROM INFUSION: 333.0000 mL | Freq: Once | INTRAVENOUS | Status: DC

## 2020-01-21 MED ORDER — SODIUM CHLORIDE 0.9% FLUSH: 3.0000 mL | INTRAVENOUS | Status: DC | PRN

## 2020-01-21 MED ORDER — LIDOCAINE HCL (PF) 1 % IJ SOLN: 30.0000 mL | INTRAMUSCULAR | Status: DC | PRN

## 2020-01-21 MED ORDER — ZOLPIDEM TARTRATE 5 MG PO TABS: 5.0000 mg | ORAL_TABLET | Freq: Every evening | ORAL | Status: DC | PRN

## 2020-01-21 MED ORDER — SENNOSIDES-DOCUSATE SODIUM 8.6-50 MG PO TABS
2.0000 | ORAL_TABLET | Freq: Every day | ORAL | Status: DC
  Administered 2020-01-22: 09:00:00 2 via ORAL
  Filled 2020-01-21: qty 2

## 2020-01-21 MED ORDER — SODIUM CHLORIDE (PF) 0.9 % IJ SOLN
INTRAMUSCULAR | Status: DC | PRN
  Administered 2020-01-21: 12 mL/h via EPIDURAL

## 2020-01-21 MED ORDER — LACTATED RINGERS IV SOLN
500.0000 mL | INTRAVENOUS | Status: DC | PRN
Start: 2020-01-21 — End: 2020-01-21

## 2020-01-21 MED ORDER — ONDANSETRON HCL 4 MG/2ML IJ SOLN: 4.0000 mg | INTRAMUSCULAR | Status: DC | PRN

## 2020-01-21 MED ORDER — OXYTOCIN-SODIUM CHLORIDE 30-0.9 UT/500ML-% IV SOLN
2.5000 [IU]/h | INTRAVENOUS | Status: DC
  Filled 2020-01-21: qty 500

## 2020-01-21 MED ORDER — ACETAMINOPHEN 500 MG PO TABS: 1000.0000 mg | ORAL_TABLET | Freq: Four times a day (QID) | ORAL | Status: DC | PRN

## 2020-01-21 MED ORDER — PRENATAL MULTIVITAMIN CH
1.0000 | ORAL_TABLET | Freq: Every day | ORAL | Status: DC
  Administered 2020-01-22: 12:00:00 1 via ORAL
  Filled 2020-01-21: qty 1

## 2020-01-21 MED ORDER — FENTANYL-BUPIVACAINE-NACL 0.5-0.125-0.9 MG/250ML-% EP SOLN
EPIDURAL | Status: AC
  Filled 2020-01-21: qty 250

## 2020-01-21 MED ORDER — LACTATED RINGERS IV SOLN: 500.0000 mL | Freq: Once | INTRAVENOUS | Status: AC

## 2020-01-21 MED ORDER — FENTANYL-BUPIVACAINE-NACL 0.5-0.125-0.9 MG/250ML-% EP SOLN: 12.0000 mL/h | EPIDURAL | Status: DC | PRN

## 2020-01-21 MED ORDER — LACTATED RINGERS IV SOLN
500.0000 mL | Freq: Once | INTRAVENOUS | Status: AC
  Administered 2020-01-21: 08:00:00 500 mL via INTRAVENOUS

## 2020-01-21 MED ORDER — SODIUM CHLORIDE 0.9 % IV SOLN: 250.0000 mL | INTRAVENOUS | Status: DC | PRN

## 2020-01-21 MED ORDER — SODIUM CHLORIDE 0.9% FLUSH: 3.0000 mL | Freq: Two times a day (BID) | INTRAVENOUS | Status: DC

## 2020-01-21 MED ORDER — SOD CITRATE-CITRIC ACID 500-334 MG/5ML PO SOLN
30.0000 mL | ORAL | Status: DC | PRN
Start: 2020-01-21 — End: 2020-01-21

## 2020-01-21 MED ORDER — ACETAMINOPHEN 325 MG PO TABS
650.0000 mg | ORAL_TABLET | ORAL | Status: DC | PRN
  Administered 2020-01-21 – 2020-01-22 (×3): 650 mg via ORAL
  Filled 2020-01-21 (×3): qty 2

## 2020-01-21 MED ORDER — SIMETHICONE 80 MG PO CHEW: 80.0000 mg | CHEWABLE_TABLET | ORAL | Status: DC | PRN

## 2020-01-21 MED ORDER — COCONUT OIL OIL: 1.0000 "application " | TOPICAL_OIL | Status: DC | PRN

## 2020-01-21 MED ORDER — FENTANYL CITRATE (PF) 100 MCG/2ML IJ SOLN: 50.0000 ug | INTRAMUSCULAR | Status: DC | PRN

## 2020-01-21 MED ORDER — ONDANSETRON HCL 4 MG/2ML IJ SOLN: 4.0000 mg | Freq: Four times a day (QID) | INTRAMUSCULAR | Status: DC | PRN

## 2020-01-21 MED ORDER — BENZOCAINE-MENTHOL 20-0.5 % EX AERO: 1.0000 "application " | INHALATION_SPRAY | CUTANEOUS | Status: DC | PRN

## 2020-01-21 NOTE — H&P (Addendum)
OB ADMISSION/ HISTORY & PHYSICAL:  Admission Date: 01/21/2020  7:27 AM  Admit Diagnosis: Normal labor [O80, Z37.9]    Courtney Colon is a 24 y.o. female presenting for contractions. Reports contractions started early this morning and are now 5 minutes apart. Rates them 10/10 on pain scale. Denies leaking of fluid or vaginal bleeding. Endorses + fetal movement. Husband, Lars Mage, at the bedside providing support. Expecting baby boy, [name].  Prenatal History: [id number]   EDC : 02/01/2020 Prenatal care at [facility] since 12 weeks, primary D. Paul CNM  Prenatal course complicated by: 1. TOLAC, G1 was PLTCS for FTP then 3 successful VBACs  2. History of pyelonephritis with G4, on prophylactic Keflex since 12 weeks  Prenatal Labs: ABO, Rh:   O POS Antibody:  Negative Rubella:   Immune RPR:   Non-reactive HBsAg:   Negative HIV:   Negative GBS:   Negative 1 hr Glucola : 94 Genetic Screening: Declined Ultrasound: normal XY anatomy, vertex, anterior fundal placenta, AFI WNL, last growth at 35 weeks EFW 69%  TDaP         UTD Flu             UTD COVID-19 Declined    Maternal Diabetes: No Genetic Screening: Declined Maternal Ultrasounds/Referrals: Normal Fetal Ultrasounds or other Referrals:  None Maternal Substance Abuse:  No Significant Maternal Medications:  Meds include: Other:  Keflex Significant Maternal Lab Results:  Group B Strep negative Other Comments:  None  Medical / Surgical History : Past medical history:  Past Medical History:  Diagnosis Date  . Anemia   . Hx of varicella   . Pyelonephritis affecting pregnancy in third trimester     Past surgical history:  Past Surgical History:  Procedure Laterality Date  . CESAREAN SECTION N/A 01/11/2015   Procedure: CESAREAN SECTION;  Surgeon: Philip Aspen, DO;  Location: [facility];  Service: Obstetrics;  Laterality: N/A;  . MOUTH SURGERY  2011  . WISDOM TOOTH EXTRACTION      Family History:  Family History  Problem Relation Age of  Onset  . Hypertension Mother     Social History:  reports that she has never smoked. She has never used smokeless tobacco. She reports that she does not drink alcohol and does not use drugs.  Allergies: Patient has no known allergies.   Current Medications at time of admission:  No current facility-administered medications on file prior to encounter.   Current Outpatient Medications on File Prior to Encounter  Medication Sig Dispense Refill  . Prenatal Vit-Fe Fumarate-FA (PRENATAL MULTIVITAMIN) TABS tablet Take 1 tablet by mouth daily at 12 noon.    . benzocaine-Menthol (DERMOPLAST) 20-0.5 % AERO Apply 1 application topically as needed for irritation (perineal discomfort).    . coconut oil OIL Apply 1 application topically as needed.  0  . ibuprofen (ADVIL) 600 MG tablet Take 1 tablet (600 mg total) by mouth every 6 (six) hours. 30 tablet 0  . iron polysaccharides (NIFEREX) 150 MG capsule Take 1 capsule (150 mg total) by mouth daily.    . magnesium hydroxide (MILK OF MAGNESIA) 400 MG/5ML suspension Take 30 mLs by mouth daily as needed for mild constipation.    . magnesium oxide (MAG-OX) 400 (241.3 Mg) MG tablet Take 1 tablet (400 mg total) by mouth daily.    Marland Kitchen nystatin-triamcinolone ointment (MYCOLOG) Apply topically 2 (two) times daily. 30 g 0    Review of Systems: Review of Systems  All other systems reviewed and are negative.  Physical  Exam: Vital signs and nursing notes reviewed.  Patient Vitals for the past 24 hrs:  BP Temp Temp src Pulse Resp SpO2 Height Weight  01/21/20 0820 -- -- -- -- -- -- 4\' 11"  (1.499 m) 72.6 kg  01/21/20 0741 128/88 98 F (36.7 C) Oral (!) 117 20 99 % -- --    General: AAO x 3, NAD Heart: RRR Lungs:CTAB Abdomen: Gravid, NT, Leopold's vertex Extremities: no edema Genitalia / VE: Dilation: 4 Effacement (%): 100 Station: -2 Presentation: Vertex Exam by:: Niva Murren cnm   FHR: 140BPM, mod variability, + accels, no decels TOCO: Contractions  5-6 minutes  Labs:   Pending T&S, CBC, RPR  Recent Labs    01/21/20 0758  WBC 10.6*  HGB 12.0  HCT 37.8  PLT 205   Assessment:  24 y.o. 22 at [redacted]w[redacted]d, TOLAC  1. Normal labor 2. FHR category 1 3. GBS negative 4. Desires epidural 5. Plans to breastfeed 6. Declines circumcision 7. History of pyelonephritis, on prophylactic Keflex since 12 weeks  Plan:  1. Admit to BS 2. Routine L&D orders 3. May have epidural upon request 4. Expectant management 5. Anticipate VBAC  Dr. [redacted]w[redacted]d notified of admission/plan of care  Conni Elliot CNM, MSN 01/21/2020, 8:42 AM

## 2020-01-21 NOTE — MAU Note (Signed)
Courtney Colon is a 24 y.o. at [redacted]w[redacted]d here in MAU reporting: contractions for the past hour. No bleeding, no LOF.  Onset of complaint: today  Pain score: 10/10  Vitals:   01/21/20 0741  BP: 128/88  Pulse: (!) 117  Resp: 20  Temp: 98 F (36.7 C)  SpO2: 99%     FHT: EFM applied in room  Lab orders placed from triage: none

## 2020-01-21 NOTE — Progress Notes (Signed)
S: Comfortable with epidural. Husband at the bedside providing support. Discussed the R/B/A of AROM for labor augmentation and patient consents to procedure.   O: Vitals:   01/21/20 0906 01/21/20 0910 01/21/20 0916 01/21/20 0920  BP: 122/69 100/84 115/62 114/60  Pulse: (!) 106 (!) 105 (!) 113 (!) 117  Resp: 18 18 18 18   Temp:      TempSrc:      SpO2:      Weight:      Height:       FHT:  FHR: 140 bpm, variability: moderate,  accelerations:  Present,  decelerations:  Absent UC:   regular, every 3-4 minutes SVE:   Dilation: 7 Effacement (%): 100 Station: -1,-2 Exam by:: [email or web address], CNM   AROM clear fluid at [phone]   A / P: Spontaneous labor, progressing normally  Fetal Wellbeing:  Category I Pain Control:  Epidural Anticipated MOD:  VBAC  [id number], CNM, MSN 01/21/2020, 9:35 AM

## 2020-01-21 NOTE — Anesthesia Preprocedure Evaluation (Signed)
Anesthesia Evaluation  Patient identified by MRN, date of birth, ID band Patient awake    Reviewed: Allergy & Precautions, Patient's Chart, lab work & pertinent test results  History of Anesthesia Complications Negative for: history of anesthetic complications  Airway Mallampati: II  TM Distance: >3 FB Neck ROM: Full    Dental no notable dental hx.    Pulmonary neg pulmonary ROS,    Pulmonary exam normal        Cardiovascular negative cardio ROS Normal cardiovascular exam     Neuro/Psych negative neurological ROS  negative psych ROS   GI/Hepatic negative GI ROS, Neg liver ROS,   Endo/Other  negative endocrine ROS  Renal/GU negative Renal ROS  negative genitourinary   Musculoskeletal negative musculoskeletal ROS (+)   Abdominal   Peds  Hematology negative hematology ROS (+)   Anesthesia Other Findings Day of surgery medications reviewed with patient.  Reproductive/Obstetrics (+) Pregnancy (Hx of C/S x1, VBAC x3)                             Anesthesia Physical Anesthesia Plan  ASA: II  Anesthesia Plan: Epidural   Post-op Pain Management:    Induction:   PONV Risk Score and Plan: Treatment may vary due to age or medical condition  Airway Management Planned: Natural Airway  Additional Equipment:   Intra-op Plan:   Post-operative Plan:   Informed Consent: I have reviewed the patients History and Physical, chart, labs and discussed the procedure including the risks, benefits and alternatives for the proposed anesthesia with the patient or authorized representative who has indicated his/her understanding and acceptance.       Plan Discussed with:   Anesthesia Plan Comments:         Anesthesia Quick Evaluation

## 2020-01-21 NOTE — Anesthesia Procedure Notes (Signed)
Epidural Patient location during procedure: OB Start time: 01/21/2020 8:42 AM End time: 01/21/2020 8:45 AM  Staffing Anesthesiologist: Kaylyn Layer, MD Performed: anesthesiologist   Preanesthetic Checklist Completed: patient identified, IV checked, risks and benefits discussed, monitors and equipment checked, pre-op evaluation and timeout performed  Epidural Patient position: sitting Prep: DuraPrep and site prepped and draped Patient monitoring: continuous pulse ox, blood pressure and heart rate Approach: midline Location: L3-L4 Injection technique: LOR air  Needle:  Needle type: Tuohy  Needle gauge: 17 G Needle length: 9 cm Catheter type: closed end flexible Catheter size: 19 Gauge Catheter at skin depth: 9 cm Test dose: negative and Other (1% lidocaine)  Assessment Events: blood not aspirated, injection not painful, no injection resistance, no paresthesia and negative IV test  Additional Notes Patient identified. Risks, benefits, and alternatives discussed with patient including but not limited to bleeding, infection, nerve damage, paralysis, failed block, incomplete pain control, headache, blood pressure changes, nausea, vomiting, reactions to medication, itching, and postpartum back pain. Confirmed with bedside nurse the patient's most recent platelet count. Confirmed with patient that they are not currently taking any anticoagulation, have any bleeding history, or any family history of bleeding disorders. Patient expressed understanding and wished to proceed. All questions were answered. Sterile technique was used throughout the entire procedure. Please see nursing notes for vital signs.   Crisp LOR on first pass. Test dose was given through epidural catheter and negative prior to continuing to dose epidural or start infusion. Warning signs of high block given to the patient including shortness of breath, tingling/numbness in hands, complete motor block, or any concerning  symptoms with instructions to call for help. Patient was given instructions on fall risk and not to get out of bed. All questions and concerns addressed with instructions to call with any issues or inadequate analgesia.  Reason for block:procedure for pain

## 2020-01-22 DIAGNOSIS — O9902 Anemia complicating childbirth: Secondary | ICD-10-CM | POA: Diagnosis not present

## 2020-01-22 LAB — CBC
HCT: 28.2 % — ABNORMAL LOW (ref 36.0–46.0)
Hemoglobin: 9.6 g/dL — ABNORMAL LOW (ref 12.0–15.0)
MCH: 30.3 pg (ref 26.0–34.0)
MCHC: 34 g/dL (ref 30.0–36.0)
MCV: 89 fL (ref 80.0–100.0)
Platelets: 174 10*3/uL (ref 150–400)
RBC: 3.17 MIL/uL — ABNORMAL LOW (ref 3.87–5.11)
RDW: 13.3 % (ref 11.5–15.5)
WBC: 11.6 10*3/uL — ABNORMAL HIGH (ref 4.0–10.5)
nRBC: 0 % (ref 0.0–0.2)

## 2020-01-22 MED ORDER — POLYSACCHARIDE IRON COMPLEX 150 MG PO CAPS: 150.0000 mg | ORAL_CAPSULE | Freq: Every day | ORAL | Status: DC

## 2020-01-22 MED ORDER — IBUPROFEN 600 MG PO TABS: 600.0000 mg | ORAL_TABLET | Freq: Four times a day (QID) | ORAL | 0 refills | Status: DC

## 2020-01-22 MED ORDER — POLYSACCHARIDE IRON COMPLEX 150 MG PO CAPS: 150.0000 mg | ORAL_CAPSULE | Freq: Every day | ORAL | 1 refills | Status: DC

## 2020-01-22 MED ORDER — ACETAMINOPHEN 325 MG PO TABS: 650.0000 mg | ORAL_TABLET | ORAL | 1 refills | Status: DC | PRN

## 2020-01-22 MED ORDER — MAGNESIUM OXIDE 400 (241.3 MG) MG PO TABS: 400.0000 mg | ORAL_TABLET | Freq: Every day | ORAL | Status: DC

## 2020-01-22 NOTE — Discharge Summary (Signed)
OB Discharge Summary  Patient Name: Courtney Colon DOB: 04/28/1995 MRN: [id number]  Date of admission: 01/21/2020 Delivering provider: Dorisann Frames K   Admitting diagnosis: Normal labor [O80, Z37.9] Intrauterine pregnancy: [redacted]w[redacted]d     Secondary diagnosis: Patient Active Problem List   Diagnosis Date Noted  . Maternal anemia, with delivery 01/22/2020  . Normal labor 01/21/2020  . Hx successful VBAC x 3 (vaginal birth after cesarean), currently pregnant 01/21/2020  . History of pyelonephritis during pregnancy, prophylactic Keflex since 12 weeks 01/21/2020  . VBAC, delivered 12/26 01/21/2020  . Postpartum care following vaginal delivery 12/26 01/21/2020   Date of discharge: 01/22/2020   Discharge diagnosis: Principal Problem:   Postpartum care following vaginal delivery 12/26 Active Problems:   Normal labor   Hx successful VBAC x 3 (vaginal birth after cesarean), currently pregnant   History of pyelonephritis during pregnancy, prophylactic Keflex since 12 weeks   VBAC, delivered 12/26   Maternal anemia, with delivery                                                            Post partum procedures:None  Augmentation: AROM Pain control: Epidural  Laceration:None  Episiotomy:None  Complications: None  Hospital course:  Onset of Labor With Vaginal Delivery      24 y.o. yo G5P5005 at [redacted]w[redacted]d was admitted in Active Labor on 01/21/2020. Patient had an uncomplicated labor course as follows:  Membrane Rupture Time/Date: 9:24 AM ,01/21/2020   Delivery Method:VBAC, Spontaneous  Episiotomy: None  Lacerations:  None  Patient had a postpartum course complicated by anemia. She will be discharged home on PO Ferrex. She is ambulating, tolerating a regular diet, passing flatus, and urinating well. Patient is discharged home in stable condition on 01/22/20.  Newborn Data: Birth date:01/21/2020  Birth time:10:51 AM  Gender:Female  Living status:Living  Apgars:9 ,9  Weight:3399 g   Physical exam   Vitals:   01/21/20 1830 01/21/20 2140 01/22/20 0155 01/22/20 0527  BP: (!) 107/58 120/76 119/78 102/65  Pulse: (!) 105 73 (!) 112 81  Resp:  16 16 16   Temp: 98.5 F (36.9 C) 97.9 F (36.6 C) 98.1 F (36.7 C) 97.9 F (36.6 C)  TempSrc: Oral Oral Oral Oral  SpO2: 99% 99% 98% 98%  Weight:      Height:       General: alert, cooperative and no distress Lochia: appropriate Uterine Fundus: firm Perineum: intact DVT Evaluation: No evidence of DVT seen on physical exam. No significant calf/ankle edema. Labs: Lab Results  Component Value Date   WBC 11.6 (H) 01/22/2020   HGB 9.6 (L) 01/22/2020   HCT 28.2 (L) 01/22/2020   MCV 89.0 01/22/2020   PLT 174 01/22/2020   CMP Latest Ref Rng & Units 11/25/2018  Glucose 70 - 99 mg/dL 79  BUN 6 - 20 mg/dL 11/27/2018)  Creatinine <4(W - 1.00 mg/dL 5.46  Sodium 2.70 - 350 mmol/L 136  Potassium 3.5 - 5.1 mmol/L 3.4(L)  Chloride 98 - 111 mmol/L 114(H)  CO2 22 - 32 mmol/L 16(L)  Calcium 8.9 - 10.3 mg/dL 7.8(L)  Total Protein 6.5 - 8.1 g/dL 4.7(L)  Total Bilirubin 0.3 - 1.2 mg/dL 0.5  Alkaline Phos 38 - 126 U/L 107  AST 15 - 41 U/L 14(L)  ALT 0 - 44 U/L 10  Edinburgh Postnatal Depression Scale Screening Tool 12/05/2018 07/28/2017  I have been able to laugh and see the funny side of things. 0 0  I have looked forward with enjoyment to things. 0 0  I have blamed myself unnecessarily when things went wrong. 1 1  I have been anxious or worried for no good reason. 1 0  I have felt scared or panicky for no good reason. 0 0  Things have been getting on top of me. 1 0  I have been so unhappy that I have had difficulty sleeping. 0 0  I have felt sad or miserable. 0 0  I have been so unhappy that I have been crying. 0 0  The thought of harming myself has occurred to me. 0 0  Edinburgh Postnatal Depression Scale Total 3 1   Vaccines: TDaP UTD         Flu    UTD         COVID-19   Declined  Discharge instructions:  per After Visit Summary and [facility]  OB booklet  After Visit Meds:  Allergies as of 01/22/2020   No Known Allergies     Medication List    STOP taking these medications   benzocaine-Menthol 20-0.5 % Aero Commonly known as: DERMOPLAST   coconut oil Oil   magnesium hydroxide 400 MG/5ML suspension Commonly known as: MILK OF MAGNESIA   magnesium oxide 400 (241.3 Mg) MG tablet Commonly known as: MAG-OX   nystatin-triamcinolone ointment Commonly known as: MYCOLOG     TAKE these medications   acetaminophen 325 MG tablet Commonly known as: Tylenol Take 2 tablets (650 mg total) by mouth every 4 (four) hours as needed (for pain scale < 4).   ibuprofen 600 MG tablet Commonly known as: ADVIL Take 1 tablet (600 mg total) by mouth every 6 (six) hours.   iron polysaccharides 150 MG capsule Commonly known as: Ferrex 150 Take 1 capsule (150 mg total) by mouth daily.   prenatal multivitamin Tabs tablet Take 1 tablet by mouth daily at 12 noon.      Diet: routine diet  Activity: Advance as tolerated. Pelvic rest for 6 weeks.   Newborn Data: Live born female  Birth Weight: 7 lb 7.9 oz (3399 g) APGAR: 9, 9  Newborn Delivery   Birth date/time: 01/21/2020 10:51:00 Delivery type: VBAC, Spontaneous      Named [name] Baby Feeding: Breast Disposition:home with mother  Delivery Report:   Review the Delivery Report for details.    Follow up:  Follow-up Information    June Leap, CNM. Schedule an appointment as soon as possible for a visit in 6 week(s).   Specialty: Certified Nurse Midwife Why: Please make an appointment for 6 weeks postpartum.  Contact information: [address] [place] [address] [phone]              June Leap, CNM, MSN 01/22/2020, 1:01 PM

## 2020-01-22 NOTE — Anesthesia Postprocedure Evaluation (Signed)
Anesthesia Post Note  Patient: Courtney Colon  Procedure(s) Performed: AN AD HOC LABOR EPIDURAL     Patient location during evaluation: Mother Baby Anesthesia Type: Epidural Level of consciousness: awake Pain management: satisfactory to patient Vital Signs Assessment: post-procedure vital signs reviewed and stable Respiratory status: spontaneous breathing Cardiovascular status: stable Anesthetic complications: no   No complications documented.  Last Vitals:  Vitals:   01/22/20 0155 01/22/20 0527  BP: 119/78 102/65  Pulse: (!) 112 81  Resp: 16 16  Temp: 36.7 C 36.6 C  SpO2: 98% 98%    Last Pain:  Vitals:   01/22/20 1022  TempSrc:   PainSc: 2    Pain Goal: Patients Stated Pain Goal: 0 (01/21/20 1315)                 Cephus Shelling

## 2020-01-22 NOTE — Lactation Note (Signed)
This note was copied from a baby's chart. Lactation Consultation Note  Patient Name: Courtney Colon [id number] Date: 01/22/2020 Reason for consult: Follow-up assessment Age:25 hours  Follow up visit to 27 hours old infant with 3.68% weight loss of a P5 mother with breastfeeding experience. Mother requests flange fitting. Mother is currently using 27 mm but she states "it feels tight". Provided 30 mm flange as a better fit.   Mother has a Motiff and an Ameda pump at home. LC explained flange provided only work for [employer].    All questions answered at this time. Family is ready to be discharged home today.   Maternal Data Formula Feeding for Exclusion: Yes Reason for exclusion: Mother's choice to formula and breast feed on admission  Feeding Feeding Type: Breast Fed  Interventions Interventions: DEBP;Hand pump;Comfort gels  Lactation Tools Discussed/Used Tools: Pump Breast pump type: Double-Electric Breast Pump;Manual   Consult Status Consult Status: Complete Date: 01/22/20 Follow-up type: Call as needed    Vira Chaplin A Higuera Ancidey 01/22/2020, 2:04 PM

## 2020-08-07 IMAGING — US US RENAL
1 series · 14 of 25 positions shown · non-contrast
Comparison: Abdominal ultrasound 11/01/2018.

CLINICAL DATA: Pyelonephritis complicating pregnancy. Additional
history provided pyelonephritis, fever for 4 days, 35 weeks
pregnant.

EXAM:
RENAL / URINARY TRACT ULTRASOUND COMPLETE

[Series 1: us renal · 14 of 33 slices shown]
[im 1/33]
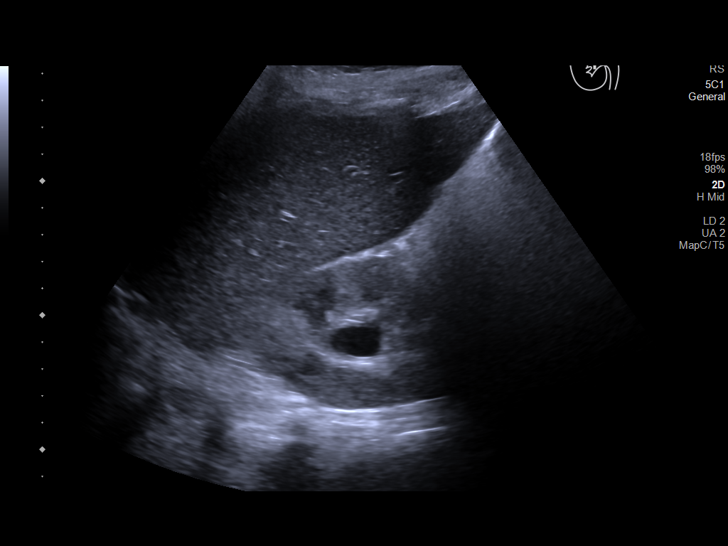
[im 3/33]
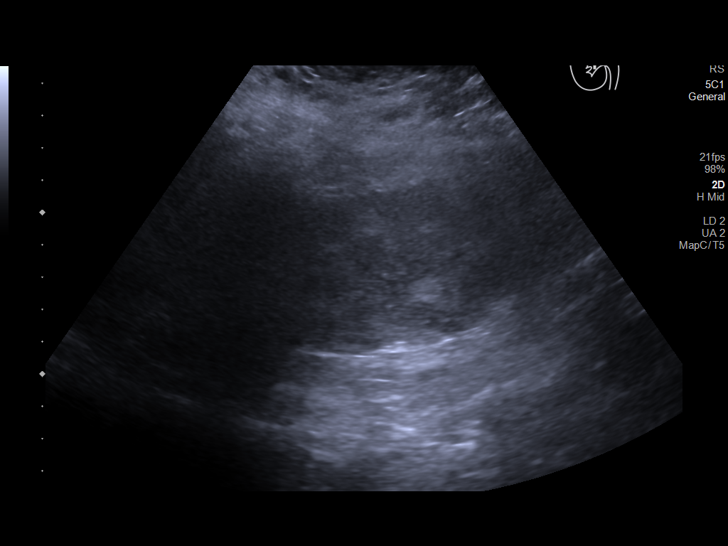
[im 6/33]
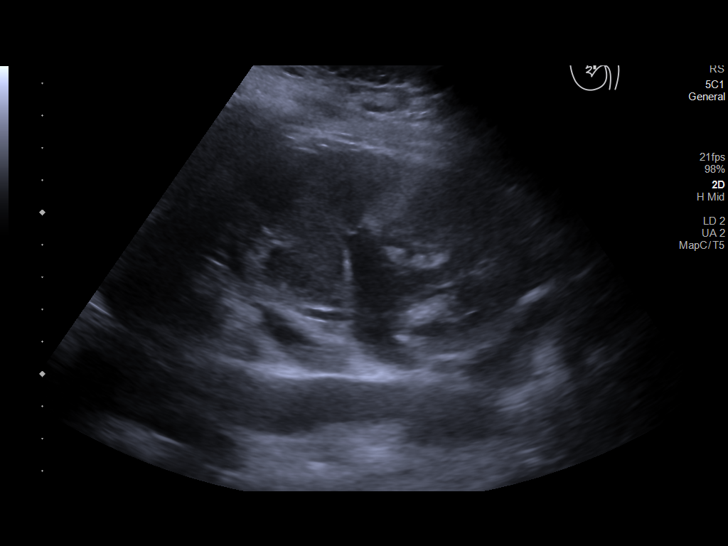
[im 9/33]
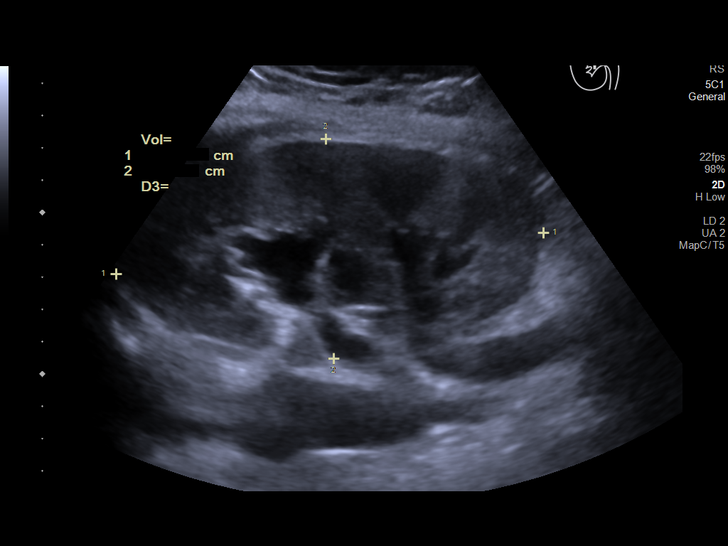
[im 11/33]
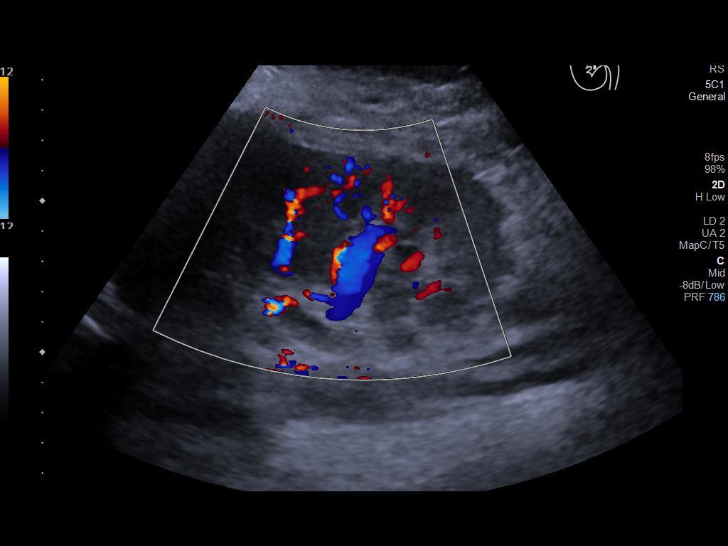
[im 13/33]
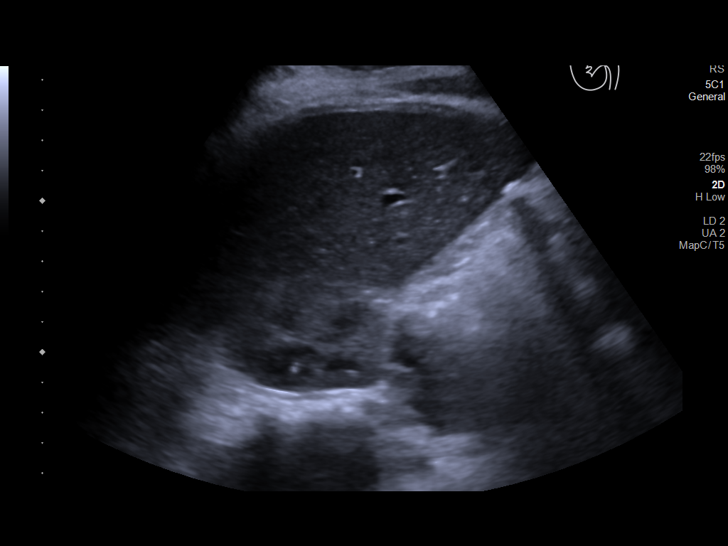
[im 15/33]
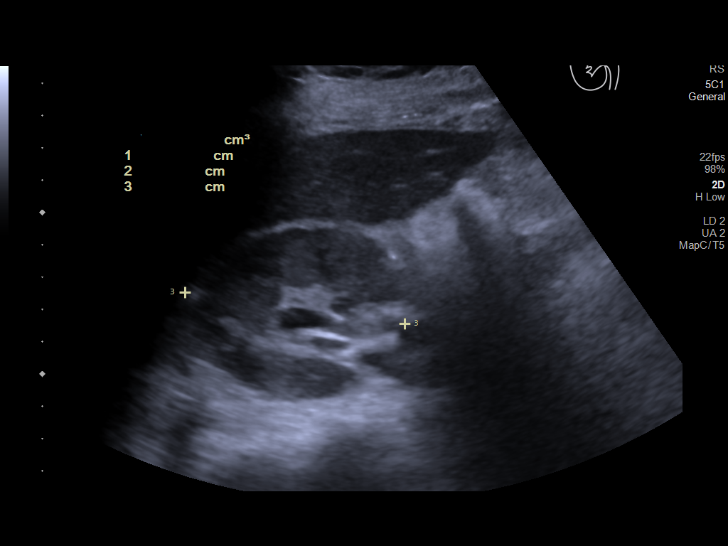
[im 18/33]
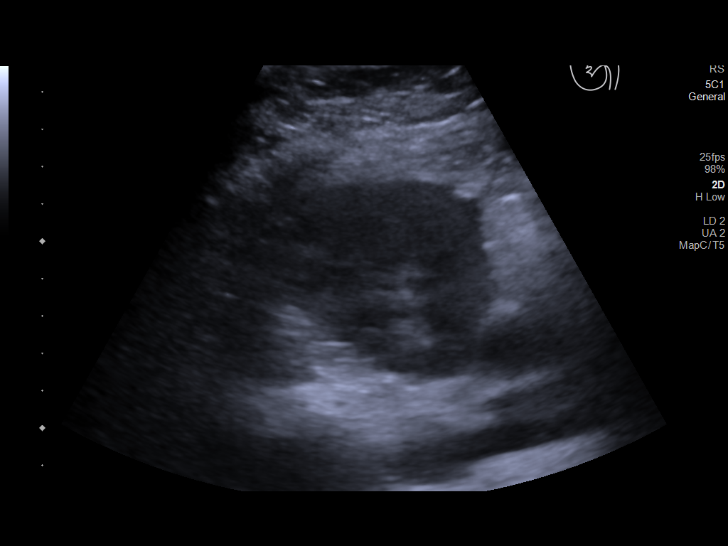
[im 21/33]
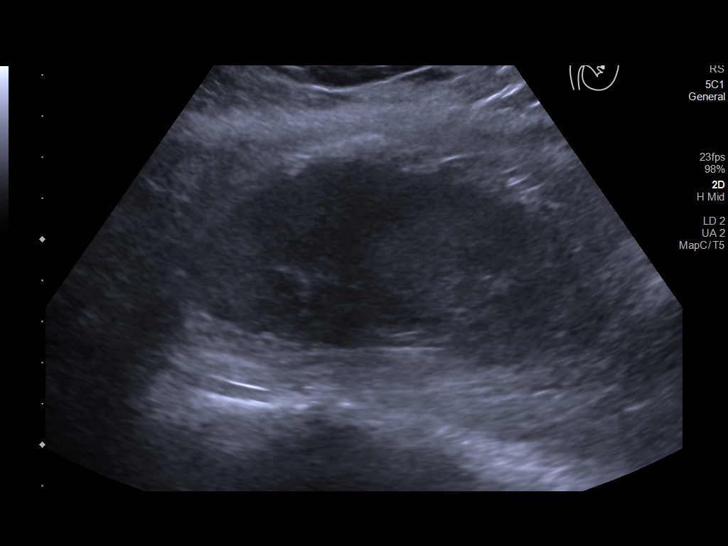
[im 22/33]
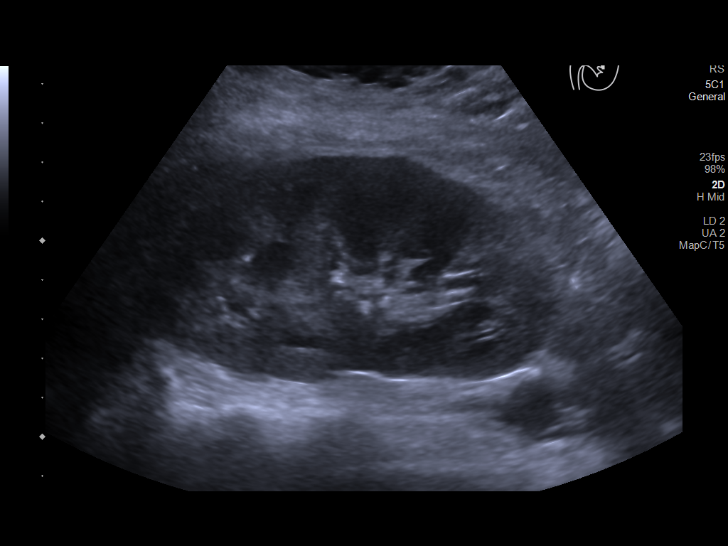
[im 25/33]
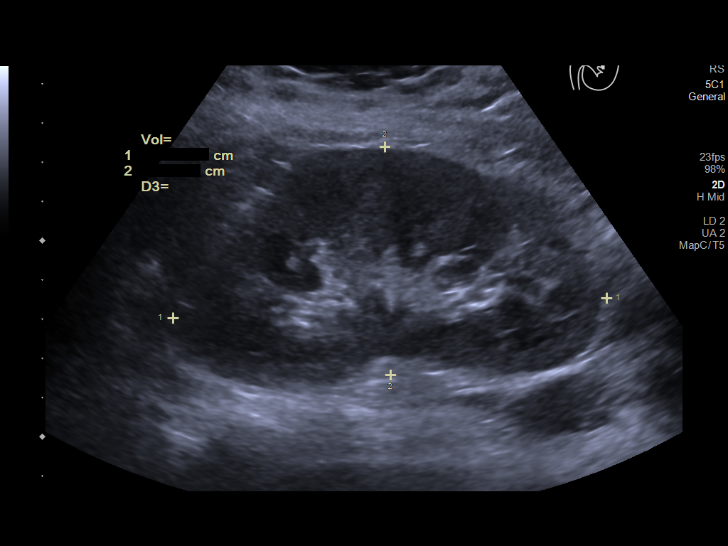
[im 27/33]
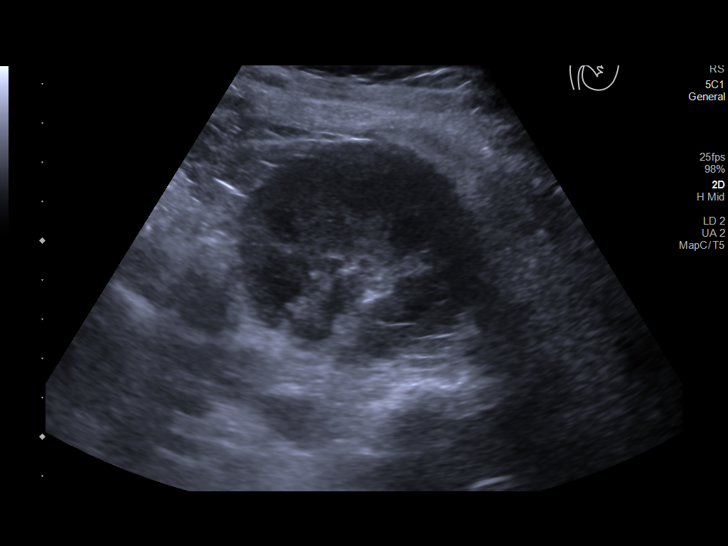
[im 30/33]
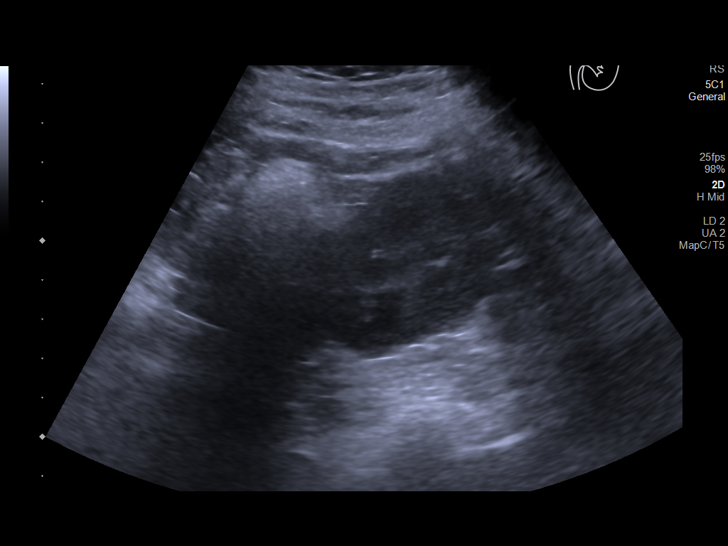
[im 33/33]
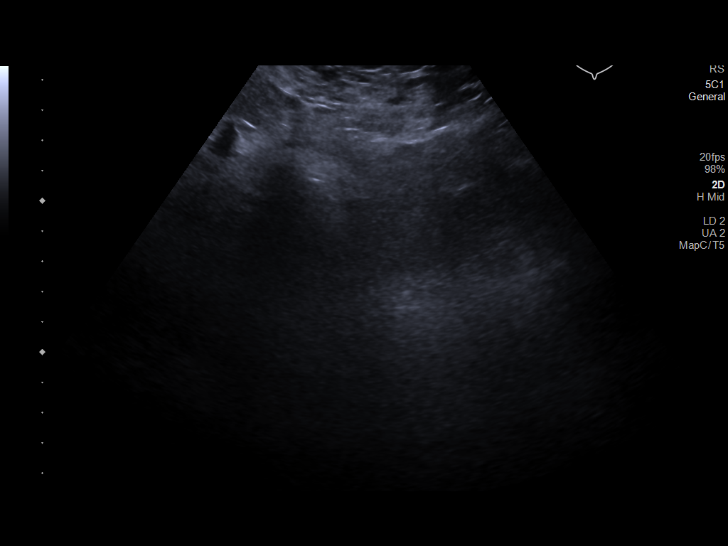

[14 of 25 positions shown; findings below may reference images not displayed]

FINDINGS: Right Kidney:

Renal measurements: 13.3 x 6.8 x 6.9 cm = volume: 325.6 mL.
Heterogeneous parenchymal echogenicity consistent with edema and the
provided history of pyelonephritis. Mild hydronephrosis appears
slightly increased from prior exam. No shadowing calculus is seen.

Left Kidney:

Renal measurements: 11.1 x 5.8 x 5.9 cm = volume: 199.03 mL.
Echogenicity within normal limits. No mass or hydronephrosis
visualized. No shadowing calculus is seen.

Bladder:

Nonvisualized.

These results will be called to the ordering clinician or
representative by the Radiologist Assistant, and communication
documented in the PACS or zVision Dashboard.
IMPRESSION: 1. Heterogeneous right renal parenchymal echogenicity consistent
with edema and the provided history of pyelonephritis.
2. Mild right hydronephrosis appears slightly increased from prior
exam 11/01/2018.

## 2020-08-07 IMAGING — DX DG CHEST 1V PORT
1 series · 1 of 1 positions shown · non-contrast
Comparison: None.

CLINICAL DATA: Sepsis.

EXAM:
PORTABLE CHEST 1 VIEW

[chest ap]
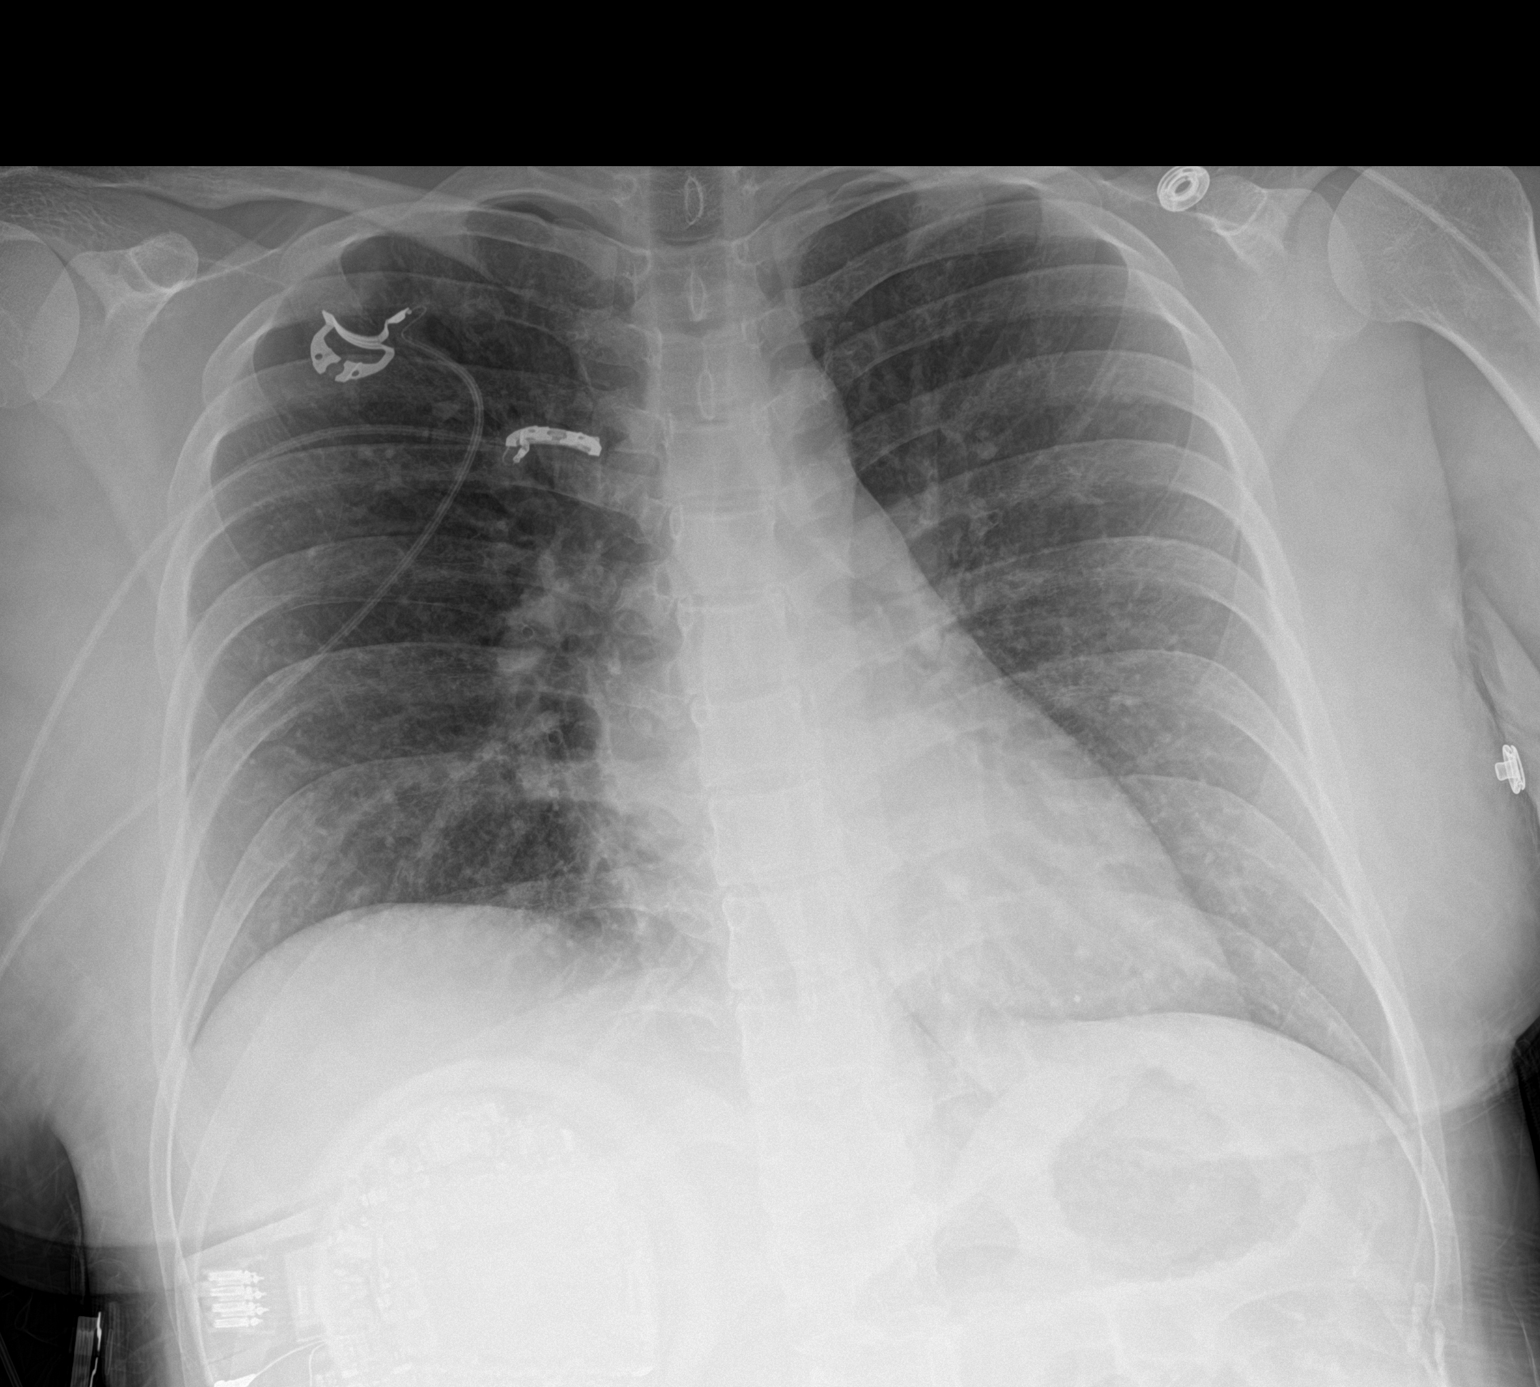

[1 of 1 positions shown; findings below may reference images not displayed]

FINDINGS: The cardiac silhouette, mediastinal and hilar contours are normal.
The lungs are clear. No pleural effusions. The bony thorax is
intact.
IMPRESSION: No acute cardiopulmonary findings.

## 2020-08-20 ENCOUNTER — Other Ambulatory Visit: Payer: Self-pay

## 2020-08-20 ENCOUNTER — Encounter: Payer: Self-pay | Admitting: Emergency Medicine

## 2020-08-20 ENCOUNTER — Ambulatory Visit
Admission: EM | Admit: 2020-08-20 | Discharge: 2020-08-20 | Disposition: A | Discharge status: Home / Self Care | Payer: Medicaid Other | Attending: Student | Admitting: Student

## 2020-08-20 DIAGNOSIS — H66004 Acute suppurative otitis media without spontaneous rupture of ear drum, recurrent, right ear: Secondary | ICD-10-CM | POA: Diagnosis not present

## 2020-08-20 DIAGNOSIS — H6121 Impacted cerumen, right ear: Secondary | ICD-10-CM | POA: Diagnosis not present

## 2020-08-20 MED ORDER — AMOXICILLIN 875 MG PO TABS: 875.0000 mg | ORAL_TABLET | Freq: Two times a day (BID) | ORAL | 0 refills | Status: AC

## 2020-08-20 NOTE — ED Triage Notes (Signed)
Pt sts right ear pain x 2 days

## 2020-08-20 NOTE — Discharge Instructions (Addendum)
-  Start the antibiotic-Amoxicillin, 1 pill every 12 hours for 7 days.  You can take this with food like with breakfast and dinner.

## 2020-08-20 NOTE — ED Provider Notes (Addendum)
[facility]    CSN: [id number] Arrival date & time: 08/20/20  1828      History   Chief Complaint Chief Complaint  Patient presents with   Otalgia    HPI Courtney Colon is a 25 y.o. female presenting with R ear pain x2 days. Medical history recurrent AOM per patient.  Describes 2 days of sharp right ear pain with muffled hearing.  Feeling well otherwise, denies fever/chills, cough, recent URI, allergic rhinitis.  Denies tinnitus, dizziness.  HPI  Past Medical History:  Diagnosis Date   Anemia    Hx of varicella    Pyelonephritis affecting pregnancy in third trimester     Patient Active Problem List   Diagnosis Date Noted   Maternal anemia, with delivery 01/22/2020   Normal labor 01/21/2020   Hx successful VBAC x 3 (vaginal birth after cesarean), currently pregnant 01/21/2020   History of pyelonephritis during pregnancy, prophylactic Keflex since 12 weeks 01/21/2020   VBAC, delivered 12/26 01/21/2020   Postpartum care following vaginal delivery 12/26 01/21/2020    Past Surgical History:  Procedure Laterality Date   CESAREAN SECTION N/A 01/11/2015   Procedure: CESAREAN SECTION;  Surgeon: Philip Aspen, DO;  Location: [facility];  Service: Obstetrics;  Laterality: N/A;   MOUTH SURGERY  2011   WISDOM TOOTH EXTRACTION      OB History     Gravida  5   Para  5   Term  5   Preterm      AB      Living  5      SAB      IAB      Ectopic      Multiple  0   Live Births  5            Home Medications    Prior to Admission medications   Medication Sig Start Date End Date Taking? Authorizing Provider  amoxicillin (AMOXIL) 875 MG tablet Take 1 tablet (875 mg total) by mouth 2 (two) times daily for 7 days. 08/20/20 08/27/20 Yes Rhys Martini, PA-C  acetaminophen (TYLENOL) 325 MG tablet Take 2 tablets (650 mg total) by mouth every 4 (four) hours as needed (for pain scale < 4). 01/22/20   June Leap, CNM  ibuprofen (ADVIL) 600 MG tablet Take  1 tablet (600 mg total) by mouth every 6 (six) hours. 01/22/20   June Leap, CNM  iron polysaccharides (FERREX 150) 150 MG capsule Take 1 capsule (150 mg total) by mouth daily. 01/22/20   June Leap, CNM  Prenatal Vit-Fe Fumarate-FA (PRENATAL MULTIVITAMIN) TABS tablet Take 1 tablet by mouth daily at 12 noon.    [provider]    Family History Family History  Problem Relation Age of Onset   Hypertension Mother     Social History Social History   Tobacco Use   Smoking status: Never   Smokeless tobacco: Never  Vaping Use   Vaping Use: Never used  Substance Use Topics   Alcohol use: No   Drug use: No     Allergies   Patient has no known allergies.   Review of Systems Review of Systems  Constitutional:  Negative for appetite change, chills and fever.  HENT:  Positive for ear pain. Negative for congestion, ear discharge, rhinorrhea, sinus pressure, sinus pain and sore throat.   Eyes:  Negative for redness and visual disturbance.  Respiratory:  Negative for cough, chest tightness, shortness of breath and wheezing.  Cardiovascular:  Negative for chest pain and palpitations.  Gastrointestinal:  Negative for abdominal pain, constipation, diarrhea, nausea and vomiting.  Genitourinary:  Negative for dysuria, frequency and urgency.  Musculoskeletal:  Negative for myalgias.  Neurological:  Negative for dizziness, weakness and headaches.  Psychiatric/Behavioral:  Negative for confusion.   All other systems reviewed and are negative.   Physical Exam Triage Vital Signs ED Triage Vitals  Enc Vitals Group     BP 08/20/20 1859 128/82     Pulse Rate 08/20/20 1859 99     Resp 08/20/20 1859 18     Temp 08/20/20 1859 98.2 F (36.8 C)     Temp Source 08/20/20 1859 Oral     SpO2 08/20/20 1859 96 %     Weight --      Height --      Head Circumference --      Peak Flow --      Pain Score 08/20/20 1900 7     Pain Loc --      Pain Edu? --      Excl. in GC? --     No data found.  Updated Vital Signs BP 128/82 (BP Location: Left Arm)   Pulse 99   Temp 98.2 F (36.8 C) (Oral)   Resp 18   SpO2 96%   Visual Acuity Right Eye Distance:   Left Eye Distance:   Bilateral Distance:    Right Eye Near:   Left Eye Near:    Bilateral Near:     Physical Exam Vitals reviewed.  Constitutional:      Appearance: Normal appearance. She is not ill-appearing.  HENT:     Head: Normocephalic and atraumatic.     Right Ear: Hearing, ear canal and external ear normal. No swelling or tenderness. No middle ear effusion. There is impacted cerumen. No mastoid tenderness. Tympanic membrane is erythematous and bulging. Tympanic membrane is not injected, scarred, perforated or retracted.     Left Ear: Hearing, tympanic membrane, ear canal and external ear normal. No swelling or tenderness.  No middle ear effusion. There is no impacted cerumen. No mastoid tenderness. Tympanic membrane is not injected, scarred, perforated, erythematous, retracted or bulging.     Ears:     Comments: R TM initially occluded by cerumen. Following disimpaction- R TM is erythematous bulging and tender external canal.    Mouth/Throat:     Pharynx: Oropharynx is clear. No oropharyngeal exudate or posterior oropharyngeal erythema.  Cardiovascular:     Rate and Rhythm: Normal rate and regular rhythm.     Heart sounds: Normal heart sounds.  Pulmonary:     Effort: Pulmonary effort is normal.     Breath sounds: Normal breath sounds.  Lymphadenopathy:     Cervical: No cervical adenopathy.  Neurological:     General: No focal deficit present.     Mental Status: She is alert and oriented to person, place, and time.  Psychiatric:        Mood and Affect: Mood normal.        Behavior: Behavior normal.        Thought Content: Thought content normal.        Judgment: Judgment normal.     UC Treatments / Results  Labs (all labs ordered are listed, but only abnormal results are displayed) Labs  Reviewed - No data to display  EKG   Radiology No results found.  Procedures Procedures (including critical care time)  Medications Ordered in UC Medications -  No data to display  Initial Impression / Assessment and Plan / UC Course  I have reviewed the triage vital signs and the nursing notes.  Pertinent labs & imaging results that were available during my care of the patient were reviewed by me and considered in my medical decision making (see chart for details).     This patient is a very pleasant 25 y.o. year old female presenting with R AOM and impacted cerumen. Lavage performed by nurse with resolution of impaction. Afebrile, nontachy. No recent URI or allergic rhintis. States she is not pregnant or breastfeeding. Amoxicillin. ED return precautions discussed. Patient verbalizes understanding and agreement.  .   Final Clinical Impressions(s) / UC Diagnoses   Final diagnoses:  Recurrent acute suppurative otitis media of right ear without spontaneous rupture of tympanic membrane  Impacted cerumen of right ear     Discharge Instructions      -Start the antibiotic-Amoxicillin, 1 pill every 12 hours for 7 days.  You can take this with food like with breakfast and dinner.       ED Prescriptions     Medication Sig Dispense Auth. Provider   amoxicillin (AMOXIL) 875 MG tablet Take 1 tablet (875 mg total) by mouth 2 (two) times daily for 7 days. 14 tablet Rhys Martini, PA-C      PDMP not reviewed this encounter.   Rhys Martini, PA-C 08/20/20 1948    Rhys Martini, PA-C 08/25/20 1534

## 2020-09-23 ENCOUNTER — Encounter: Payer: Self-pay | Admitting: Emergency Medicine

## 2020-09-23 ENCOUNTER — Other Ambulatory Visit: Payer: Self-pay

## 2020-09-23 ENCOUNTER — Ambulatory Visit
Admission: EM | Admit: 2020-09-23 | Discharge: 2020-09-23 | Disposition: A | Discharge status: Home / Self Care | Payer: Medicaid Other | Attending: Physician Assistant | Admitting: Physician Assistant

## 2020-09-23 DIAGNOSIS — N3000 Acute cystitis without hematuria: Secondary | ICD-10-CM

## 2020-09-23 LAB — POCT URINALYSIS DIP (MANUAL ENTRY)
Bilirubin, UA: NEGATIVE
Blood, UA: NEGATIVE
Glucose, UA: NEGATIVE mg/dL
Ketones, POC UA: NEGATIVE mg/dL
Leukocytes, UA: NEGATIVE
Nitrite, UA: NEGATIVE
Protein Ur, POC: 30 mg/dL — AB
Spec Grav, UA: 1.02 (ref 1.010–1.025)
Urobilinogen, UA: 0.2 E.U./dL
pH, UA: 7.5 (ref 5.0–8.0)

## 2020-09-23 LAB — POCT URINE PREGNANCY: Preg Test, Ur: NEGATIVE

## 2020-09-23 MED ORDER — NITROFURANTOIN MONOHYD MACRO 100 MG PO CAPS: 100.0000 mg | ORAL_CAPSULE | Freq: Two times a day (BID) | ORAL | 0 refills | Status: DC

## 2020-09-23 NOTE — ED Triage Notes (Signed)
Dysuria, hesitancy and frequency of urination with mild lower abdominal pain. Denies fever. Hx of frequent UTIs

## 2020-09-23 NOTE — ED Provider Notes (Signed)
[facility]    CSN: [id number] Arrival date & time: 09/23/20  1755      History   Chief Complaint Chief Complaint  Patient presents with   Dysuria    HPI Nikko Quast is a 25 y.o. female.   Pt complains of increased urinary frequency, dysuria, and lower abdominal discomfort that started several days ago.  She has a h/o frequent UTIs and reports today sx feel very similar.  Denies vaginal discharge, vaginal itching, pelvic pain, fever, chills, flank pain, n/v. She has been taking AZO with temporary relief.    Past Medical History:  Diagnosis Date   Anemia    Hx of varicella    Pyelonephritis affecting pregnancy in third trimester     Patient Active Problem List   Diagnosis Date Noted   Maternal anemia, with delivery 01/22/2020   Normal labor 01/21/2020   Hx successful VBAC x 3 (vaginal birth after cesarean), currently pregnant 01/21/2020   History of pyelonephritis during pregnancy, prophylactic Keflex since 12 weeks 01/21/2020   VBAC, delivered 12/26 01/21/2020   Postpartum care following vaginal delivery 12/26 01/21/2020    Past Surgical History:  Procedure Laterality Date   CESAREAN SECTION N/A 01/11/2015   Procedure: CESAREAN SECTION;  Surgeon: Philip Aspen, DO;  Location: [facility];  Service: Obstetrics;  Laterality: N/A;   MOUTH SURGERY  2011   WISDOM TOOTH EXTRACTION      OB History     Gravida  5   Para  5   Term  5   Preterm      AB      Living  5      SAB      IAB      Ectopic      Multiple  0   Live Births  5            Home Medications    Prior to Admission medications   Medication Sig Start Date End Date Taking? Authorizing Provider  acetaminophen (TYLENOL) 325 MG tablet Take 2 tablets (650 mg total) by mouth every 4 (four) hours as needed (for pain scale < 4). 01/22/20   June Leap, CNM  ibuprofen (ADVIL) 600 MG tablet Take 1 tablet (600 mg total) by mouth every 6 (six) hours. 01/22/20   June Leap,  CNM  iron polysaccharides (FERREX 150) 150 MG capsule Take 1 capsule (150 mg total) by mouth daily. 01/22/20   June Leap, CNM  Prenatal Vit-Fe Fumarate-FA (PRENATAL MULTIVITAMIN) TABS tablet Take 1 tablet by mouth daily at 12 noon.    [provider]    Family History Family History  Problem Relation Age of Onset   Hypertension Mother     Social History Social History   Tobacco Use   Smoking status: Never   Smokeless tobacco: Never  Vaping Use   Vaping Use: Never used  Substance Use Topics   Alcohol use: No   Drug use: No     Allergies   Patient has no known allergies.   Review of Systems Review of Systems  Constitutional:  Negative for chills and fever.  HENT:  Negative for ear pain and sore throat.   Eyes:  Negative for pain and visual disturbance.  Respiratory:  Negative for cough and shortness of breath.   Cardiovascular:  Negative for chest pain and palpitations.  Gastrointestinal:  Negative for abdominal pain and vomiting.  Genitourinary:  Positive for dysuria, frequency and urgency. Negative for hematuria, vaginal  bleeding, vaginal discharge and vaginal pain.  Musculoskeletal:  Negative for arthralgias and back pain.  Skin:  Negative for color change and rash.  Neurological:  Negative for seizures and syncope.  All other systems reviewed and are negative.   Physical Exam Triage Vital Signs ED Triage Vitals [09/23/20 1901]  Enc Vitals Group     BP 122/70     Pulse Rate 90     Resp 16     Temp 98 F (36.7 C)     Temp Source Oral     SpO2 96 %     Weight      Height      Head Circumference      Peak Flow      Pain Score 0     Pain Loc      Pain Edu?      Excl. in GC?    No data found.  Updated Vital Signs BP 122/70 (BP Location: Left Arm)   Pulse 90   Temp 98 F (36.7 C) (Oral)   Resp 16   SpO2 96%   Visual Acuity Right Eye Distance:   Left Eye Distance:   Bilateral Distance:    Right Eye Near:   Left Eye Near:     Bilateral Near:     Physical Exam Vitals and nursing note reviewed.  Constitutional:      General: She is not in acute distress.    Appearance: She is well-developed.  HENT:     Head: Normocephalic and atraumatic.  Eyes:     Conjunctiva/sclera: Conjunctivae normal.  Cardiovascular:     Rate and Rhythm: Normal rate and regular rhythm.     Heart sounds: No murmur heard. Pulmonary:     Effort: Pulmonary effort is normal. No respiratory distress.     Breath sounds: Normal breath sounds.  Abdominal:     Palpations: Abdomen is soft.     Tenderness: There is no abdominal tenderness.  Musculoskeletal:     Cervical back: Neck supple.  Skin:    General: Skin is warm and dry.  Neurological:     Mental Status: She is alert.     UC Treatments / Results  Labs (all labs ordered are listed, but only abnormal results are displayed) Labs Reviewed  POCT URINALYSIS DIP (MANUAL ENTRY) - Abnormal; Notable for the following components:      Result Value   Clarity, UA hazy (*)    Protein Ur, POC =30 (*)    All other components within normal limits  POCT URINE PREGNANCY    EKG   Radiology No results found.  Procedures Procedures (including critical care time)  Medications Ordered in UC Medications - No data to display  Initial Impression / Assessment and Plan / UC Course  I have reviewed the triage vital signs and the nursing notes.  Pertinent labs & imaging results that were available during my care of the patient were reviewed by me and considered in my medical decision making (see chart for details).    Will treat for UTI.  Urine culture sent.  If no improvement advised to return here or for evaluation why gyn.   Final Clinical Impressions(s) / UC Diagnoses   Final diagnoses:  None   Discharge Instructions   None    ED Prescriptions   None    PDMP not reviewed this encounter.   Ward, Tylene Fantasia, PA-C 09/23/20 1935

## 2020-09-23 NOTE — Discharge Instructions (Addendum)
Take medication as prescribed Drink lots of fluids If no improvement follow up with OBGYN

## 2021-04-19 ENCOUNTER — Other Ambulatory Visit: Payer: Self-pay

## 2021-04-19 ENCOUNTER — Ambulatory Visit (HOSPITAL_COMMUNITY)
Admission: EM | Admit: 2021-04-19 | Discharge: 2021-04-19 | Disposition: A | Discharge status: Home / Self Care | Payer: Medicaid Other | Attending: Family Medicine | Admitting: Family Medicine

## 2021-04-19 ENCOUNTER — Encounter (HOSPITAL_COMMUNITY): Payer: Self-pay | Admitting: Emergency Medicine

## 2021-04-19 DIAGNOSIS — H60502 Unspecified acute noninfective otitis externa, left ear: Secondary | ICD-10-CM | POA: Diagnosis not present

## 2021-04-19 MED ORDER — NEOMYCIN-POLYMYXIN-HC 3.5-10000-1 OT SUSP: 4.0000 [drp] | Freq: Three times a day (TID) | OTIC | 0 refills | Status: DC

## 2021-04-19 NOTE — ED Notes (Signed)
No answer in lobby.  Called on phone number as listed in computor ?

## 2021-04-19 NOTE — ED Triage Notes (Signed)
Left ear pain started 2 days ago.  Hearing is muffled in left ear.  Denies placing anything in ear.  Denies cough, cold or sore throat.   ?

## 2021-04-21 NOTE — ED Provider Notes (Signed)
?  [facility] ? ? ?[id number] ?04/19/21 Arrival Time: 1610 ? ?ASSESSMENT & PLAN: ? ?1. Acute otitis externa of left ear, unspecified type   ? ?Begin: ?Meds ordered this encounter  ?Medications  ? neomycin-polymyxin-hydrocortisone (CORTISPORIN) 3.5-10000-1 OTIC suspension  ?  Sig: Place 4 drops into the left ear 3 (three) times daily.  ?  Dispense:  10 mL  ?  Refill:  0  ? ?OTC symptom care as needed. ?Ensure adequate fluid intake and rest. ?May f/u with PCP or here as needed. ? ?Reviewed expectations re: course of current medical issues. Questions answered. ?Outlined signs and symptoms indicating need for more acute intervention. ?Patient verbalized understanding. ?After Visit Summary given. ? ? ?SUBJECTIVE: ?History from: patient. ? ?Courtney Colon is a 26 y.o. female who presents with complaint of left otalgia; without drainage; without bleeding. Onset gradual,  first noted approx 48 h ago . Recent cold symptoms: none. Fever: no. Overall normal PO intake without n/v. Sick contacts: no. ?OTC treatment: none. ? ?Social History  ? ?Tobacco Use  ?Smoking Status Never  ?Smokeless Tobacco Never  ? ? ? ? ?OBJECTIVE: ? ?Vitals:  ? 04/19/21 1743  ?BP: 117/76  ?Pulse: 81  ?Resp: 18  ?Temp: 99.3 ?F (37.4 ?C)  ?TempSrc: Oral  ?SpO2: 98%  ?  ? ?General appearance: alert; appears fatigued ?Ear Canal: edema and inflammation on the left ?TM: bilat appear normal ?Neck: supple without LAD ?Lungs: unlabored respirations, symmetrical air entry; cough: absent; no respiratory distress ?Skin: warm and dry ?Psychological: alert and cooperative; normal mood and affect ? ?No Known Allergies ? ?Past Medical History:  ?Diagnosis Date  ? Anemia   ? Hx of varicella   ? Pyelonephritis affecting pregnancy in third trimester   ? ?Family History  ?Problem Relation Age of Onset  ? Hypertension Mother   ? ?Social History  ? ?Socioeconomic History  ? Marital status: Significant Other  ?  Spouse name: Not on file  ? Number of children: Not on file   ? Years of education: Not on file  ? Highest education level: Not on file  ?Occupational History  ? Not on file  ?Tobacco Use  ? Smoking status: Never  ? Smokeless tobacco: Never  ?Vaping Use  ? Vaping Use: Never used  ?Substance and Sexual Activity  ? Alcohol use: No  ? Drug use: No  ? Sexual activity: Yes  ?Other Topics Concern  ? Not on file  ?Social History Narrative  ? Not on file  ? ?Social Determinants of Health  ? ?Financial Resource Strain: Not on file  ?Food Insecurity: Not on file  ?Transportation Needs: Not on file  ?Physical Activity: Not on file  ?Stress: Not on file  ?Social Connections: Not on file  ?Intimate Partner Violence: Not on file  ? ? ? ? ? ? ? ? ? ?  ?Mardella Layman, MD ?04/21/21 [phone] ? ?

## 2022-01-23 ENCOUNTER — Inpatient Hospital Stay (HOSPITAL_COMMUNITY): Payer: Medicaid Other

## 2022-01-23 ENCOUNTER — Inpatient Hospital Stay (HOSPITAL_COMMUNITY)
Admission: AD | Admit: 2022-01-23 | Discharge: 2022-01-23 | Disposition: A | Discharge status: Home / Self Care | Payer: Medicaid Other | Attending: Obstetrics and Gynecology | Admitting: Obstetrics and Gynecology

## 2022-01-23 ENCOUNTER — Encounter (HOSPITAL_COMMUNITY): Payer: Self-pay | Admitting: *Deleted

## 2022-01-23 DIAGNOSIS — O26891 Other specified pregnancy related conditions, first trimester: Secondary | ICD-10-CM | POA: Insufficient documentation

## 2022-01-23 DIAGNOSIS — O2631 Retained intrauterine contraceptive device in pregnancy, first trimester: Secondary | ICD-10-CM | POA: Insufficient documentation

## 2022-01-23 DIAGNOSIS — R109 Unspecified abdominal pain: Secondary | ICD-10-CM | POA: Insufficient documentation

## 2022-01-23 DIAGNOSIS — Z3A01 Less than 8 weeks gestation of pregnancy: Secondary | ICD-10-CM | POA: Insufficient documentation

## 2022-01-23 DIAGNOSIS — O208 Other hemorrhage in early pregnancy: Secondary | ICD-10-CM | POA: Diagnosis present

## 2022-01-23 DIAGNOSIS — O418X10 Other specified disorders of amniotic fluid and membranes, first trimester, not applicable or unspecified: Secondary | ICD-10-CM

## 2022-01-23 DIAGNOSIS — O468X1 Other antepartum hemorrhage, first trimester: Secondary | ICD-10-CM

## 2022-01-23 LAB — URINALYSIS, ROUTINE W REFLEX MICROSCOPIC
Bilirubin Urine: NEGATIVE
Glucose, UA: NEGATIVE mg/dL
Hgb urine dipstick: NEGATIVE
Ketones, ur: NEGATIVE mg/dL
Nitrite: NEGATIVE
Protein, ur: NEGATIVE mg/dL
Specific Gravity, Urine: 1.023 (ref 1.005–1.030)
pH: 6 (ref 5.0–8.0)

## 2022-01-23 LAB — CBC
HCT: 40.4 % (ref 36.0–46.0)
Hemoglobin: 13.9 g/dL (ref 12.0–15.0)
MCH: 30.5 pg (ref 26.0–34.0)
MCHC: 34.4 g/dL (ref 30.0–36.0)
MCV: 88.8 fL (ref 80.0–100.0)
Platelets: 218 10*3/uL (ref 150–400)
RBC: 4.55 MIL/uL (ref 3.87–5.11)
RDW: 12 % (ref 11.5–15.5)
WBC: 9.8 10*3/uL (ref 4.0–10.5)
nRBC: 0 % (ref 0.0–0.2)

## 2022-01-23 LAB — HCG, QUANTITATIVE, PREGNANCY: hCG, Beta Chain, Quant, S: 54453 m[IU]/mL — ABNORMAL HIGH (ref <5)

## 2022-01-23 LAB — WET PREP, GENITAL
Clue Cells Wet Prep HPF POC: NONE SEEN
Sperm: NONE SEEN
Trich, Wet Prep: NONE SEEN
WBC, Wet Prep HPF POC: <10 (ref <10)
Yeast Wet Prep HPF POC: NONE SEEN

## 2022-01-23 LAB — POCT PREGNANCY, URINE: Preg Test, Ur: POSITIVE — AB

## 2022-01-23 LAB — ABO/RH: ABO/RH(D): O POS

## 2022-01-23 NOTE — MAU Provider Note (Signed)
History     CSN: [id number]  Arrival date and time: 01/23/22 1547   Event Date/Time   First Provider Initiated Contact with Patient 01/23/22 1714      Chief Complaint  Patient presents with   Possible Pregnancy   Abdominal Pain   Courtney Colon , a  26 y.o. [id number] at [redacted]w[redacted]d presents to MAU with complaints of a positive UPT in the presence of a Copper IUD. Patient states she has a copper IUD placed 2 years ago. She states she had a period every month up until October. She denies coming out. She noted that upon placement she experienced some difficulty with placement, but was confirmed with [place]. She has had no other complications. Patient states she had a positive UPT at home yesterday.  She denies abnormal vaginal discharge, vaginal bleeding. She has some mild cramping yesterday today She denies abdominal pain, and cramping.She denies urinary symptoms.         Possible Pregnancy Associated symptoms include abdominal pain. Pertinent negatives include no chest pain, chills, fatigue, fever, headaches, nausea, vomiting or weakness.  Abdominal Pain Pertinent negatives include no constipation, diarrhea, dysuria, fever, headaches, nausea or vomiting.    OB History     Gravida  6   Para  5   Term  5   Preterm      AB      Living  5      SAB      IAB      Ectopic      Multiple  0   Live Births  5           Past Medical History:  Diagnosis Date   Anemia    Hx of varicella    Pyelonephritis affecting pregnancy in third trimester     Past Surgical History:  Procedure Laterality Date   CESAREAN SECTION N/A 01/11/2015   Procedure: CESAREAN SECTION;  Surgeon: Allyn Kenner, DO;  Location: [facility];  Service: Obstetrics;  Laterality: N/A;   MOUTH SURGERY  2011   WISDOM TOOTH EXTRACTION      Family History  Problem Relation Age of Onset   Hypertension Mother     Social History   Tobacco Use   Smoking status: Never   Smokeless tobacco: Never  Vaping Use    Vaping Use: Never used  Substance Use Topics   Alcohol use: No   Drug use: No    Allergies: No Known Allergies  Medications Prior to Admission  Medication Sig Dispense Refill Last Dose   acetaminophen (TYLENOL) 325 MG tablet Take 2 tablets (650 mg total) by mouth every 4 (four) hours as needed (for pain scale < 4). 30 tablet 1 Past Week   diphenhydrAMINE (SOMINEX) 25 MG tablet Take 25 mg by mouth at bedtime as needed for sleep.      doxylamine, Sleep, (UNISOM) 25 MG tablet Take 25 mg by mouth at bedtime as needed.   01/22/2022   ibuprofen (ADVIL) 600 MG tablet Take 1 tablet (600 mg total) by mouth every 6 (six) hours. 30 tablet 0 Past Week   iron polysaccharides (FERREX 150) 150 MG capsule Take 1 capsule (150 mg total) by mouth daily. 30 capsule 1 01/23/2022   neomycin-polymyxin-hydrocortisone (CORTISPORIN) 3.5-10000-1 OTIC suspension Place 4 drops into the left ear 3 (three) times daily. 10 mL 0    nitrofurantoin, macrocrystal-monohydrate, (MACROBID) 100 MG capsule Take 1 capsule (100 mg total) by mouth 2 (two) times daily. (Patient not taking: Reported on 01/23/2022)  10 capsule 0 Not Taking   Prenatal Vit-Fe Fumarate-FA (PRENATAL MULTIVITAMIN) TABS tablet Take 1 tablet by mouth daily at 12 noon.       Review of Systems  Constitutional:  Negative for chills, fatigue and fever.  Eyes:  Negative for pain and visual disturbance.  Respiratory:  Negative for apnea, shortness of breath and wheezing.   Cardiovascular:  Negative for chest pain and palpitations.  Gastrointestinal:  Positive for abdominal pain. Negative for constipation, diarrhea, nausea and vomiting.  Genitourinary:  Negative for difficulty urinating, dysuria, pelvic pain, vaginal bleeding, vaginal discharge and vaginal pain.  Musculoskeletal:  Negative for back pain.  Neurological:  Negative for seizures, weakness and headaches.  Psychiatric/Behavioral:  Negative for suicidal ideas.    Physical Exam   Blood pressure 127/87,  pulse (!) 105, temperature 99.1 F (37.3 C), temperature source Oral, resp. rate 17, height 5' (1.524 m), weight 66.7 kg, last menstrual period 11/09/2021, SpO2 97 %, unknown if currently breastfeeding.  Physical Exam Vitals and nursing note reviewed.  Constitutional:      General: She is not in acute distress.    Appearance: Normal appearance.  HENT:     Head: Normocephalic.  Pulmonary:     Effort: Pulmonary effort is normal.  Abdominal:     General: Abdomen is flat.     Palpations: Abdomen is soft.  Musculoskeletal:     Cervical back: Normal range of motion.  Skin:    General: Skin is warm and dry.     Capillary Refill: Capillary refill takes less than 2 seconds.  Neurological:     Mental Status: She is alert and oriented to person, place, and time.  Psychiatric:        Mood and Affect: Mood normal.    MAU Course  Procedures Orders Placed This Encounter  Procedures   Wet prep, genital   Culture, OB Urine   US OB LESS THAN 14 WEEKS WITH OB TRANSVAGINAL   Urinalysis, Routine w reflex microscopic Urine, Clean Catch   CBC   hCG, quantitative, pregnancy   Diet NPO time specified   Pregnancy, urine POC   ABO/Rh   Results for orders placed or performed during the hospital encounter of 01/23/22 (from the past 24 hour(s))  Pregnancy, urine POC     Status: Abnormal   Collection Time: 01/23/22  4:28 PM  Result Value Ref Range   Preg Test, Ur POSITIVE (A) NEGATIVE  Urinalysis, Routine w reflex microscopic Urine, Clean Catch     Status: Abnormal   Collection Time: 01/23/22  4:32 PM  Result Value Ref Range   Color, Urine YELLOW YELLOW   APPearance HAZY (A) CLEAR   Specific Gravity, Urine 1.023 1.005 - 1.030   pH 6.0 5.0 - 8.0   Glucose, UA NEGATIVE NEGATIVE mg/dL   Hgb urine dipstick NEGATIVE NEGATIVE   Bilirubin Urine NEGATIVE NEGATIVE   Ketones, ur NEGATIVE NEGATIVE mg/dL   Protein, ur NEGATIVE NEGATIVE mg/dL   Nitrite NEGATIVE NEGATIVE   Leukocytes,Ua SMALL (A)  NEGATIVE   RBC / HPF 0-5 0 - 5 RBC/hpf   WBC, UA 0-5 0 - 5 WBC/hpf   Bacteria, UA RARE (A) NONE SEEN   Squamous Epithelial / LPF 6-10 0 - 5 /HPF   Mucus PRESENT   Wet prep, genital     Status: None   Collection Time: 01/23/22  5:20 PM   Specimen: Vaginal  Result Value Ref Range   Yeast Wet Prep HPF POC NONE SEEN NONE SEEN  Trich, Wet Prep NONE SEEN NONE SEEN   Clue Cells Wet Prep HPF POC NONE SEEN NONE SEEN   WBC, Wet Prep HPF POC <10 <10   Sperm NONE SEEN   CBC     Status: None   Collection Time: 01/23/22  5:35 PM  Result Value Ref Range   WBC 9.8 4.0 - 10.5 K/uL   RBC 4.55 3.87 - 5.11 MIL/uL   Hemoglobin 13.9 12.0 - 15.0 g/dL   HCT 40.4 36.0 - 46.0 %   MCV 88.8 80.0 - 100.0 fL   MCH 30.5 26.0 - 34.0 pg   MCHC 34.4 30.0 - 36.0 g/dL   RDW 12.0 11.5 - 15.5 %   Platelets 218 150 - 400 K/uL   nRBC 0.0 0.0 - 0.2 %  ABO/Rh     Status: None   Collection Time: 01/23/22  5:35 PM  Result Value Ref Range   ABO/RH(D) O POS    No rh immune globuloin      NOT A RH IMMUNE GLOBULIN CANDIDATE, PT RH POSITIVE Performed at [facility] [address], [place], [place] [address]   hCG, quantitative, pregnancy     Status: Abnormal   Collection Time: 01/23/22  5:35 PM  Result Value Ref Range   hCG, Beta Chain, Quant, S 54,453 (H) <5 mIU/mL   US OB LESS THAN 14 WEEKS WITH OB TRANSVAGINAL  Result Date: 01/23/2022 CLINICAL DATA:  IUD with positive urine pregnancy test. Serum beta HCG in progress. EXAM: OBSTETRIC <14 WK [place] AND TRANSVAGINAL OB US TECHNIQUE: Both transabdominal and transvaginal ultrasound examinations were performed for complete evaluation of the gestation as well as the maternal uterus, adnexal regions, and pelvic cul-de-sac. Transvaginal technique was performed to assess early pregnancy. COMPARISON:  None Available. FINDINGS: Intrauterine device is present within the endometrium tilted in an oblique angle and appears separate from the gestational sac but appears associated  with a large subchorionic hemorrhage. Intrauterine gestational sac: Single Yolk sac:  Visualized. Embryo:  Visualized. Cardiac Activity: Visualized. Heart Rate: 133 bpm CRL:  9.0 mm   6 w   6 d                  [place] EDC: November 12, 2022 Subchorionic hemorrhage:  Large subchorionic hemorrhage Maternal uterus/adnexae: Physiologic corpus luteum in the right ovary. IMPRESSION: Early intrauterine pregnancy at 6 weeks and 6 days with a large subchorionic hemorrhage that is associated with an intrauterine contraceptive device. Electronically Signed   By: [name] M.D.   On: 01/23/2022 19:53     MDM Wet Prep Normal  CBC normal  O Pos  QUant >54000, consistent with 6e6d pregnancy.  - IUD in place on [place]. Large [place] noted on [place] associated with IUD placement.  - Attempted to remove, IUD, strings not visible. Plan to get patient set up in the office.  - Message sent to [facility] to get patient scheduled as soon as available.  - Plan for discharge.   Assessment and Plan   1. Pregnancy with IUD in place, antepartum, first trimester   2. Abdominal cramping   3. [redacted] weeks gestation of pregnancy   4. Subchorionic hematoma in first trimester, single or unspecified fetus    - Reviewed results with patient and patient agreeable to follow up In the office.  - Worsening signs and return precautions reviewed.  - Bleeding and pain precautions discussed.  - Patient discharged home in stable condition and may return to MAU as  needed.   Claudette Head, MSN CNM  01/23/2022, 8:04 PM

## 2022-01-23 NOTE — MAU Note (Signed)
.  Courtney Colon is a 26 y.o. at Unknown here in MAU reporting: she had an IUD placed 2 years ago and has not had a period for the last 2 months. Did home upt last night and it was positive. Pt reports for the last 2-3 days she has been having some pressure and cramping in her mid abd. Denies vaginal bleeding or dysuria LMP: 11/09/2021 Onset of complaint: 2 days ago Pain score: 3/10 Vitals:   01/23/22 1621  BP: 127/87  Pulse: (!) 105  Resp: 17  Temp: 99.1 F (37.3 C)  SpO2: 97%     FHT:n/a Lab orders placed from triage:urine

## 2022-01-26 LAB — CULTURE, OB URINE: Culture: 10000 — AB

## 2022-01-27 LAB — GC/CHLAMYDIA PROBE AMP (~~LOC~~) NOT AT ARMC
Chlamydia: NEGATIVE
Comment: NEGATIVE
Comment: NORMAL
Neisseria Gonorrhea: NEGATIVE

## 2022-01-28 NOTE — Progress Notes (Unsigned)
GYNECOLOGY OFFICE VISIT NOTE  History:   Courtney Colon is a 27 y.o. [id number] here today for IUD removal in setting of pregnancy confirmed to be IUP. CRL visualized with cardiac activity. IUD was placed after delivery in 2021 as an outpatient. Pregnancy is about 7 weeks by [place] (LMP not accurate and large enough discrepancy).     Past Medical History:  Diagnosis Date   Anemia    Hx of varicella    Pyelonephritis affecting pregnancy in third trimester     Past Surgical History:  Procedure Laterality Date   CESAREAN SECTION N/A 01/11/2015   Procedure: CESAREAN SECTION;  Surgeon: Allyn Kenner, DO;  Location: [facility];  Service: Obstetrics;  Laterality: N/A;   MOUTH SURGERY  2011   WISDOM TOOTH EXTRACTION      The following portions of the patient's history were reviewed and updated as appropriate: allergies, current medications, past family history, past medical history, past social history, past surgical history and problem list.   Review of Systems:  Pertinent items noted in HPI and remainder of comprehensive ROS otherwise negative.  Physical Exam:  LMP 11/09/2021 (Approximate)  CONSTITUTIONAL: Well-developed, well-nourished female in no acute distress.  HEENT:  Normocephalic, atraumatic. External right and left ear normal. No scleral icterus.  NECK: Normal range of motion, supple, no masses noted on observation SKIN: No rash noted. Not diaphoretic. No erythema. No pallor. MUSCULOSKELETAL: Normal range of motion. No edema noted. NEUROLOGIC: Alert and oriented to person, place, and time. Normal muscle tone coordination. No cranial nerve deficit noted. PSYCHIATRIC: Normal mood and affect. Normal behavior. Normal judgment and thought content.  CARDIOVASCULAR: Normal heart rate noted RESPIRATORY: Effort and breath sounds normal, no problems with respiration noted ABDOMEN: No masses noted. No other overt distention noted.    PELVIC: Normal appearing external genitalia; normal urethral  meatus; normal appearing vaginal mucosa and cervix.  No abnormal discharge noted.  Normal uterine size, no other palpable masses, no uterine or adnexal tenderness. IUD strings visualized. *** Performed in the presence of a chaperone  Labs and Imaging Results for orders placed or performed during the hospital encounter of 01/23/22 (from the past 168 hour(s))  Pregnancy, urine POC   Collection Time: 01/23/22  4:28 PM  Result Value Ref Range   Preg Test, Ur POSITIVE (A) NEGATIVE  Urinalysis, Routine w reflex microscopic Urine, Clean Catch   Collection Time: 01/23/22  4:32 PM  Result Value Ref Range   Color, Urine YELLOW YELLOW   APPearance HAZY (A) CLEAR   Specific Gravity, Urine 1.023 1.005 - 1.030   pH 6.0 5.0 - 8.0   Glucose, UA NEGATIVE NEGATIVE mg/dL   Hgb urine dipstick NEGATIVE NEGATIVE   Bilirubin Urine NEGATIVE NEGATIVE   Ketones, ur NEGATIVE NEGATIVE mg/dL   Protein, ur NEGATIVE NEGATIVE mg/dL   Nitrite NEGATIVE NEGATIVE   Leukocytes,Ua SMALL (A) NEGATIVE   RBC / HPF 0-5 0 - 5 RBC/hpf   WBC, UA 0-5 0 - 5 WBC/hpf   Bacteria, UA RARE (A) NONE SEEN   Squamous Epithelial / HPF 6-10 0 - 5 /HPF   Mucus PRESENT   GC/Chlamydia probe amp ([place])not at [facility]   Collection Time: 01/23/22  5:14 PM  Result Value Ref Range   Neisseria Gonorrhea Negative    Chlamydia Negative    Comment Normal Reference [place] Chlamydia - Negative    Comment      Normal Reference Range Neisseria Gonorrhea - Negative  Wet prep, genital   Collection Time:  01/23/22  5:20 PM   Specimen: Vaginal  Result Value Ref Range   Yeast Wet Prep HPF POC NONE SEEN NONE SEEN   Trich, Wet Prep NONE SEEN NONE SEEN   Clue Cells Wet Prep HPF POC NONE SEEN NONE SEEN   WBC, Wet Prep HPF POC <10 <10   Sperm NONE SEEN   CBC   Collection Time: 01/23/22  5:35 PM  Result Value Ref Range   WBC 9.8 4.0 - 10.5 K/uL   RBC 4.55 3.87 - 5.11 MIL/uL   Hemoglobin 13.9 12.0 - 15.0 g/dL   HCT 16.6 06.3 - 01.6 %   MCV 88.8  80.0 - 100.0 fL   MCH 30.5 26.0 - 34.0 pg   MCHC 34.4 30.0 - 36.0 g/dL   RDW 01.0 93.2 - 35.5 %   Platelets 218 150 - 400 K/uL   nRBC 0.0 0.0 - 0.2 %  hCG, quantitative, pregnancy   Collection Time: 01/23/22  5:35 PM  Result Value Ref Range   hCG, Beta Chain, Quant, S 54,453 (H) <5 mIU/mL  ABO/Rh   Collection Time: 01/23/22  5:35 PM  Result Value Ref Range   ABO/RH(D) O POS    No rh immune globuloin      NOT A RH IMMUNE GLOBULIN CANDIDATE, PT RH POSITIVE Performed at [facility], [address] [place], [place] [address]   Culture, [place] Urine   Collection Time: 01/23/22  8:00 PM   Specimen: Urine, Random  Result Value Ref Range   Specimen Description URINE, RANDOM    Special Requests NONE    Culture (A)     10,000 COLONIES/mL ENTEROCOCCUS FAECALIS NO GROUP B STREP (S.AGALACTIAE) ISOLATED Performed at [facility], [address] [place], [place] [address]    Report Status 01/26/2022 FINAL    Organism ID, Bacteria ENTEROCOCCUS FAECALIS (A)       Susceptibility   Enterococcus faecalis - MIC*    AMPICILLIN <=2 SENSITIVE Sensitive     NITROFURANTOIN <=16 SENSITIVE Sensitive     VANCOMYCIN 1 SENSITIVE Sensitive     * 10,000 COLONIES/mL ENTEROCOCCUS FAECALIS   US OB LESS THAN 14 WEEKS WITH OB TRANSVAGINAL  Result Date: 01/23/2022 CLINICAL DATA:  IUD with positive urine pregnancy test. Serum beta HCG in progress. EXAM: OBSTETRIC <14 WK [place] AND TRANSVAGINAL OB US TECHNIQUE: Both transabdominal and transvaginal ultrasound examinations were performed for complete evaluation of the gestation as well as the maternal uterus, adnexal regions, and pelvic cul-de-sac. Transvaginal technique was performed to assess early pregnancy. COMPARISON:  None Available. FINDINGS: Intrauterine device is present within the endometrium tilted in an oblique angle and appears separate from the gestational sac but appears associated with a large subchorionic hemorrhage. Intrauterine gestational  sac: Single Yolk sac:  Visualized. Embryo:  Visualized. Cardiac Activity: Visualized. Heart Rate: 133 bpm CRL:  9.0 mm   6 w   6 d                  [place] EDC: November 12, 2022 Subchorionic hemorrhage:  Large subchorionic hemorrhage Maternal uterus/adnexae: Physiologic corpus luteum in the right ovary. IMPRESSION: Early intrauterine pregnancy at 6 weeks and 6 days with a large subchorionic hemorrhage that is associated with an intrauterine contraceptive device. Electronically Signed   By: Maudry Mayhew M.D.   On: 01/23/2022 19:53      GYNECOLOGY OFFICE PROCEDURE NOTE  IUD Removal  Patient identified, informed consent performed, consent signed.  Patient was in the  dorsal lithotomy position, normal external genitalia was noted.  A speculum was placed in the patient's vagina, normal discharge was noted, no lesions. The cervix was visualized, no lesions, no abnormal discharge.  The strings of the IUD were grasped and pulled using ring forceps. {Blank single:19197::"The IUD was removed in its entirety. ","The strings of the IUD were not visualized, so Kelly forceps were introduced into the endometrial cavity and the IUD was grasped and removed in its entirety. ","The IUD was unable to be removed"}  Patient tolerated the procedure well.     Assessment and Plan:   1. IUD failure, pregnant Prior to removal reviewed risks of removal vs leaving in. I recommended removal as lower risk of loss in this setting. However we also discussed it is sometimes not possible to remove the IUD. Today, ***    Diagnoses and all orders for this visit:  IUD failure, pregnant    Routine preventative health maintenance measures emphasized. Please refer to After Visit Summary for other counseling recommendations.   No follow-ups on file.  Radene Gunning, MD, [place] for [facility], [place]

## 2022-01-29 ENCOUNTER — Encounter (HOSPITAL_COMMUNITY): Payer: Self-pay | Admitting: Family Medicine

## 2022-01-29 ENCOUNTER — Ambulatory Visit: Payer: Medicaid Other | Admitting: Obstetrics and Gynecology

## 2022-01-29 ENCOUNTER — Other Ambulatory Visit: Payer: Self-pay

## 2022-01-29 ENCOUNTER — Telehealth: Payer: Self-pay | Admitting: Family Medicine

## 2022-01-29 ENCOUNTER — Encounter: Payer: Self-pay | Admitting: Obstetrics and Gynecology

## 2022-01-29 VITALS — BP 124/85 | HR 99 | Ht 60.0 in | Wt 146.0 lb

## 2022-01-29 DIAGNOSIS — Z331 Pregnant state, incidental: Secondary | ICD-10-CM

## 2022-01-29 DIAGNOSIS — T8331XA Breakdown (mechanical) of intrauterine contraceptive device, initial encounter: Secondary | ICD-10-CM

## 2022-01-29 NOTE — Progress Notes (Signed)
PCP - denies Cardiologist - denies  PPM/ICD - denies  CPAP - n/a  Fasting Blood Sugar - n/a  Blood Thinner Instructions: n/a Aspirin Instructions: Patient was instructed: As of today, STOP taking any Aspirin (unless otherwise instructed by your surgeon) Aleve, Naproxen, Ibuprofen, Motrin, Advil, Goody's, BC's, all herbal medications, fish oil, and all vitamins.  ERAS Protcol - yes, until 07:15  COVID TEST- n/a  Anesthesia review: no  Patient verbally denies any shortness of breath, fever, cough and chest pain during phone call   -------------  SDW INSTRUCTIONS given:  Your procedure is scheduled on Friday 5th, 2024.  Report to [facility] Main Entrance "A" at 07:45 A.M., and check in at the Admitting office.  Call this number if you have problems the morning of surgery:  [phone]   Remember:  Do not eat after midnight the night before your surgery  You may drink clear liquids until 07:15 the morning of your surgery.   Clear liquids allowed are: Water, Non-Citrus Juices (without pulp), Carbonated Beverages, Clear Tea, Black Coffee Only, and Gatorade    Take these medicines the morning of surgery with A SIP OF WATER:  Tylenol - PRN  The day of surgery:                     Do not wear jewelry, make up, or nail polish            Do not wear lotions, powders, perfumes, or deodorant.            Do not shave 48 hours prior to surgery.              Do not bring valuables to the hospital.            [facility] is not responsible for any belongings or valuables.  Do NOT Smoke (Tobacco/Vaping) 24 hours prior to your procedure If you use a CPAP at night, you may bring all equipment for your overnight stay.   Contacts, glasses, dentures or bridgework may not be worn into surgery.      For patients admitted to the hospital, discharge time will be determined by your treatment team.   Patients discharged the day of surgery will not be allowed to drive home, and someone needs  to stay with them for 24 hours.    Special instructions:   [facility]- Preparing For Surgery  Before surgery, you can play an important role. Because skin is not sterile, your skin needs to be as free of germs as possible. You can reduce the number of germs on your skin by washing with CHG (chlorahexidine gluconate) Soap before surgery.  CHG is an antiseptic cleaner which kills germs and bonds with the skin to continue killing germs even after washing.    Oral Hygiene is also important to reduce your risk of infection.  Remember - BRUSH YOUR TEETH THE MORNING OF SURGERY WITH YOUR REGULAR TOOTHPASTE  Please do not use if you have an allergy to CHG or antibacterial soaps. If your skin becomes reddened/irritated stop using the CHG.  Do not shave (including legs and underarms) for at least 48 hours prior to first CHG shower. It is OK to shave your face.  Please follow these instructions carefully.   Shower the NIGHT BEFORE SURGERY and the MORNING OF SURGERY with DIAL Soap.   Pat yourself dry with a CLEAN TOWEL.  Wear CLEAN PAJAMAS to bed the night before surgery  Place CLEAN SHEETS  on your bed the night of your first shower and DO NOT SLEEP WITH PETS.   Day of Surgery: Please shower morning of surgery  Wear Clean/Comfortable clothing the morning of surgery Do not apply any deodorants/lotions.   Remember to brush your teeth WITH YOUR REGULAR TOOTHPASTE.   Questions were answered. Patient verbalized understanding of instructions.

## 2022-01-29 NOTE — Anesthesia Preprocedure Evaluation (Addendum)
Anesthesia Evaluation  Patient identified by MRN, date of birth, ID band Patient awake    Reviewed: Allergy & Precautions, NPO status , Patient's Chart, lab work & pertinent test results  Airway Mallampati: II  TM Distance: >3 FB Neck ROM: Full    Dental no notable dental hx. (+) Teeth Intact, Dental Advisory Given   Pulmonary neg pulmonary ROS   Pulmonary exam normal breath sounds clear to auscultation       Cardiovascular negative cardio ROS Normal cardiovascular exam Rhythm:Regular Rate:Normal     Neuro/Psych negative neurological ROS  negative psych ROS   GI/Hepatic negative GI ROS, Neg liver ROS,,,  Endo/Other  negative endocrine ROS    Renal/GU negative Renal ROS  negative genitourinary   Musculoskeletal negative musculoskeletal ROS (+)    Abdominal   Peds  Hematology negative hematology ROS (+)   Anesthesia Other Findings [redacted] weeks gestation with retained IUD  Reproductive/Obstetrics (+) Pregnancy                             Anesthesia Physical Anesthesia Plan  ASA: 1  Anesthesia Plan: MAC   Post-op Pain Management:    Induction: Intravenous  PONV Risk Score and Plan: 2 and Ondansetron, Dexamethasone and Midazolam  Airway Management Planned: Natural Airway and Simple Face Mask  Additional Equipment:   Intra-op Plan:   Post-operative Plan:   Informed Consent: I have reviewed the patients History and Physical, chart, labs and discussed the procedure including the risks, benefits and alternatives for the proposed anesthesia with the patient or authorized representative who has indicated his/her understanding and acceptance.     Dental advisory given  Plan Discussed with: CRNA  Anesthesia Plan Comments:        Anesthesia Quick Evaluation

## 2022-01-29 NOTE — Telephone Encounter (Signed)
Telephone call to patient with surgery date/time/instructions.   Posted for 01/30/22 at 10:15am at [facility]

## 2022-01-30 ENCOUNTER — Encounter (HOSPITAL_COMMUNITY)
Admission: RE | Disposition: A | Discharge status: Home / Self Care | Payer: Self-pay | Source: Ambulatory Visit | Attending: Family Medicine

## 2022-01-30 ENCOUNTER — Ambulatory Visit (HOSPITAL_COMMUNITY): Payer: Medicaid Other | Admitting: Anesthesiology

## 2022-01-30 ENCOUNTER — Ambulatory Visit (HOSPITAL_COMMUNITY)
Admission: RE | Admit: 2022-01-30 | Discharge: 2022-01-30 | Disposition: A | Discharge status: Home / Self Care | Payer: Medicaid Other | Source: Ambulatory Visit | Attending: Family Medicine | Admitting: Family Medicine

## 2022-01-30 ENCOUNTER — Other Ambulatory Visit: Payer: Self-pay

## 2022-01-30 ENCOUNTER — Other Ambulatory Visit (HOSPITAL_COMMUNITY): Payer: Self-pay | Admitting: Family Medicine

## 2022-01-30 DIAGNOSIS — Z3A01 Less than 8 weeks gestation of pregnancy: Secondary | ICD-10-CM | POA: Insufficient documentation

## 2022-01-30 DIAGNOSIS — Z30432 Encounter for removal of intrauterine contraceptive device: Secondary | ICD-10-CM

## 2022-01-30 DIAGNOSIS — O26891 Other specified pregnancy related conditions, first trimester: Secondary | ICD-10-CM | POA: Diagnosis not present

## 2022-01-30 DIAGNOSIS — O2631 Retained intrauterine contraceptive device in pregnancy, first trimester: Secondary | ICD-10-CM | POA: Diagnosis not present

## 2022-01-30 DIAGNOSIS — T8389XA Other specified complication of genitourinary prosthetic devices, implants and grafts, initial encounter: Secondary | ICD-10-CM | POA: Insufficient documentation

## 2022-01-30 DIAGNOSIS — T8339XA Other mechanical complication of intrauterine contraceptive device, initial encounter: Secondary | ICD-10-CM

## 2022-01-30 DIAGNOSIS — O2630 Retained intrauterine contraceptive device in pregnancy, unspecified trimester: Secondary | ICD-10-CM

## 2022-01-30 DIAGNOSIS — Z3A00 Weeks of gestation of pregnancy not specified: Secondary | ICD-10-CM

## 2022-01-30 HISTORY — PX: OPERATIVE ULTRASOUND: SHX5996

## 2022-01-30 HISTORY — PX: IUD REMOVAL: SHX5392

## 2022-01-30 SURGERY — REMOVAL, INTRAUTERINE DEVICE
Anesthesia: Monitor Anesthesia Care

## 2022-01-30 MED ORDER — LIDOCAINE HCL (PF) 1 % IJ SOLN
INTRAMUSCULAR | Status: AC
  Filled 2022-01-30: qty 30

## 2022-01-30 MED ORDER — FENTANYL CITRATE (PF) 100 MCG/2ML IJ SOLN: 25.0000 ug | INTRAMUSCULAR | Status: DC | PRN

## 2022-01-30 MED ORDER — LACTATED RINGERS IV SOLN: INTRAVENOUS | Status: DC

## 2022-01-30 MED ORDER — FENTANYL CITRATE (PF) 250 MCG/5ML IJ SOLN
INTRAMUSCULAR | Status: AC
  Filled 2022-01-30: qty 5

## 2022-01-30 MED ORDER — LIDOCAINE 2% (20 MG/ML) 5 ML SYRINGE
INTRAMUSCULAR | Status: DC | PRN
  Administered 2022-01-30: 100 mg via INTRAVENOUS

## 2022-01-30 MED ORDER — ORAL CARE MOUTH RINSE: 15.0000 mL | Freq: Once | OROMUCOSAL | Status: AC

## 2022-01-30 MED ORDER — CHLORHEXIDINE GLUCONATE 0.12 % MT SOLN
15.0000 mL | Freq: Once | OROMUCOSAL | Status: AC
  Administered 2022-01-30: 15 mL via OROMUCOSAL
  Filled 2022-01-30: qty 15

## 2022-01-30 MED ORDER — FENTANYL CITRATE (PF) 100 MCG/2ML IJ SOLN
INTRAMUSCULAR | Status: DC | PRN
  Administered 2022-01-30: 50 ug via INTRAVENOUS
  Administered 2022-01-30: 25 ug via INTRAVENOUS

## 2022-01-30 MED ORDER — SODIUM CHLORIDE 0.9 % IV SOLN
2.0000 g | Freq: Once | INTRAVENOUS | Status: AC
  Administered 2022-01-30: 2 g via INTRAVENOUS
  Filled 2022-01-30: qty 2

## 2022-01-30 MED ORDER — PROPOFOL 500 MG/50ML IV EMUL
INTRAVENOUS | Status: DC | PRN
  Administered 2022-01-30: 150 ug/kg/min via INTRAVENOUS

## 2022-01-30 MED ORDER — MIDAZOLAM HCL 5 MG/5ML IJ SOLN
INTRAMUSCULAR | Status: DC | PRN
  Administered 2022-01-30: 2 mg via INTRAVENOUS

## 2022-01-30 MED ORDER — BUPIVACAINE HCL (PF) 0.25 % IJ SOLN
INTRAMUSCULAR | Status: DC | PRN
  Administered 2022-01-30: 20 mL

## 2022-01-30 MED ORDER — PROPOFOL 10 MG/ML IV BOLUS
INTRAVENOUS | Status: AC
  Filled 2022-01-30: qty 20

## 2022-01-30 MED ORDER — MIDAZOLAM HCL 2 MG/2ML IJ SOLN
INTRAMUSCULAR | Status: AC
  Filled 2022-01-30: qty 2

## 2022-01-30 MED ORDER — ONDANSETRON HCL 4 MG/2ML IJ SOLN
INTRAMUSCULAR | Status: DC | PRN
  Administered 2022-01-30: 4 mg via INTRAVENOUS

## 2022-01-30 MED ORDER — BUPIVACAINE HCL (PF) 0.25 % IJ SOLN
INTRAMUSCULAR | Status: AC
  Filled 2022-01-30: qty 30

## 2022-01-30 SURGICAL SUPPLY — 7 items
GLOVE BIOGEL PI IND STRL 7.0 (GLOVE) ×2 IMPLANT
GLOVE ECLIPSE 7.0 STRL STRAW (GLOVE) ×2 IMPLANT
GOWN STRL REUS W/TWL LRG LVL3 (GOWN DISPOSABLE) ×2 IMPLANT
NS IRRIG 1000ML POUR BTL (IV SOLUTION) ×1 IMPLANT
PACK VAGINAL MINOR WOMEN LF (CUSTOM PROCEDURE TRAY) IMPLANT
PAD OB MATERNITY 4.3X12.25 (PERSONAL CARE ITEMS) ×1 IMPLANT
TOWEL OR 17X24 6PK STRL BLUE (TOWEL DISPOSABLE) ×1 IMPLANT

## 2022-01-30 NOTE — Transfer of Care (Signed)
Immediate Anesthesia Transfer of Care Note  Patient: Courtney Colon  Procedure(s) Performed: INTRAUTERINE DEVICE (IUD) REMOVAL OPERATIVE ULTRASOUND  Patient Location: PACU  Anesthesia Type:MAC  Level of Consciousness: drowsy and patient cooperative  Airway & Oxygen Therapy: Patient Spontanous Breathing  Post-op Assessment: Report given to RN and Post -op Vital signs reviewed and stable  Post vital signs: Reviewed and stable  Last Vitals:  Vitals Value Taken Time  BP 104/61 01/30/22 1100  Temp    Pulse 89 01/30/22 1101  Resp 15 01/30/22 1101  SpO2 93 % 01/30/22 1101  Vitals shown include unvalidated device data.  Last Pain:  Vitals:   01/30/22 0848  TempSrc:   PainSc: 0-No pain         Complications: No notable events documented.

## 2022-01-30 NOTE — H&P (Signed)
Courtney Colon is an 27 y.o. [id number] female.   Chief Complaint: retained IUD HPI: Patient with IUP and retained IUD. Attempt at IUD removal in the office is unsuccessful. Booked for attempt at removal in the OR. She does have a large [place] related to IUD.  Past Medical History:  Diagnosis Date   Anemia    Hx of varicella    Pyelonephritis affecting pregnancy in third trimester     Past Surgical History:  Procedure Laterality Date   CESAREAN SECTION N/A 01/11/2015   Procedure: CESAREAN SECTION;  Surgeon: Allyn Kenner, DO;  Location: [facility];  Service: Obstetrics;  Laterality: N/A;   MOUTH SURGERY  2011   WISDOM TOOTH EXTRACTION      Family History  Problem Relation Age of Onset   Hypertension Mother    Social History:  reports that she has never smoked. She has never used smokeless tobacco. She reports that she does not drink alcohol and does not use drugs.  Allergies: No Known Allergies  Medications Prior to Admission  Medication Sig Dispense Refill   acetaminophen (TYLENOL) 325 MG tablet Take 650 mg by mouth every 6 (six) hours as needed for moderate pain.     Ferrous Sulfate (IRON PO) Take 1 tablet by mouth 2 (two) times daily.     ibuprofen (ADVIL) 200 MG tablet Take 400 mg by mouth every 6 (six) hours as needed for moderate pain or headache.      A comprehensive review of systems was negative.  Blood pressure 120/73, pulse 98, temperature 98.3 F (36.8 C), temperature source Oral, resp. rate 18, height 5' (1.524 m), weight 66.2 kg, last menstrual period 11/09/2021, SpO2 98 %, unknown if currently breastfeeding. General appearance: alert, cooperative, and appears stated age Head: Normocephalic, without obvious abnormality, atraumatic Neck: supple, symmetrical, trachea midline and thyroid not enlarged, symmetric, no tenderness/mass/nodules Lungs:  normal effort Heart: regular rate and rhythm Abdomen: soft, non-tender; bowel sounds normal; no masses,  no  organomegaly Extremities: extremities normal, atraumatic, no cyanosis or edema Skin: Skin color, texture, turgor normal. No rashes or lesions Neurologic: Grossly normal   No results found for this or any previous visit (from the past 24 hour(s)). US OB LESS THAN 14 WEEKS WITH OB TRANSVAGINAL  Result Date: 01/23/2022 CLINICAL DATA:  IUD with positive urine pregnancy test. Serum beta HCG in progress. EXAM: OBSTETRIC <14 WK [place] AND TRANSVAGINAL OB US TECHNIQUE: Both transabdominal and transvaginal ultrasound examinations were performed for complete evaluation of the gestation as well as the maternal uterus, adnexal regions, and pelvic cul-de-sac. Transvaginal technique was performed to assess early pregnancy. COMPARISON:  None Available. FINDINGS: Intrauterine device is present within the endometrium tilted in an oblique angle and appears separate from the gestational sac but appears associated with a large subchorionic hemorrhage. Intrauterine gestational sac: Single Yolk sac:  Visualized. Embryo:  Visualized. Cardiac Activity: Visualized. Heart Rate: 133 bpm CRL:  9.0 mm   6 w   6 d                  [place] EDC: November 12, 2022 Subchorionic hemorrhage:  Large subchorionic hemorrhage Maternal uterus/adnexae: Physiologic corpus luteum in the right ovary. IMPRESSION: Early intrauterine pregnancy at 6 weeks and 6 days with a large subchorionic hemorrhage that is associated with an intrauterine contraceptive device. Electronically Signed   By: [name] M.D.   On: 01/23/2022 19:53    Assessment/Plan Active Problems:   Retained intrauterine device (IUD) during pregnancy in first trimester,  antepartum  For cervical dilation with u/s guidance for attempt at IUD removal. Possible hysteroscopic retrieval. Risks include but are not limited to bleeding, infection, miscarriage, injury to surrounding structures, including bowel, bladder and ureters, blood clots, and death.  Likelihood of success is  high.    Courtney Colon 01/30/2022, 8:48 AM

## 2022-01-30 NOTE — Op Note (Signed)
PROCEDURE DATE: 01/30/2022  PREOPERATIVE DIAGNOSIS: retained IUD in 1st trimester pregnancy  POSTOPERATIVE DIAGNOSIS: The same  PROCEDURE:  IUD removal under ultrasound guidance  SURGEON:  Yannick Steuber S  INDICATIONS: retained IUD, unable to be removed in the office. Large [place] associated with this.  Risks of surgery were discussed with the patient including but not limited to: bleeding which may require transfusion; infection which may require antibiotics; miscarriage,  injury to uterus or surrounding organs.  Likelihood of removal is moderate.  FINDINGS:  A 6 week size uterus, nml appearing cervix.  ANESTHESIA:    Monitored intravenous sedation, paracervical block-0.25% marcaine  ESTIMATED BLOOD LOSS:  Less than 20 ml.  SPECIMENS: IUD, no pathology  COMPLICATIONS:  None immediate.  PROCEDURE DETAILS:  She was then taken to the operating room where MAC anesthesia was administered and was found to be adequate.  After an adequate timeout was performed, she was placed in the dorsal lithotomy position and examined; then prepped and draped in the sterile manner.  A vaginal speculum was then placed in the patient's vagina.  A paracervical block using 0.25% Marcaine with Epi was administered. The cervix was grasped with a ring forcep. Under ultrasound guidance a uterine dressing forcep was extended superiorly to the gestational sac. Several passes were needed until the strings were grasped and the IUD removed without difficulty. There was minimal bleeding noted. There was a fetal flicker noted at the beginning and end of the case. The FHR was 101 at case start.  The patient tolerated the procedure well.  The patient was taken to the recovery area in stable condition. Two gm of Cefotan given in PACU.  Standley Dakins PrattMD 01/30/2022 10:52 AM

## 2022-01-30 NOTE — Interval H&P Note (Signed)
History and Physical Interval Note:  01/30/2022 9:58 AM  Courtney Colon  has presented today for surgery, with the diagnosis of retained IUD in pregnancy.  The various methods of treatment have been discussed with the patient and family. After consideration of risks, benefits and other options for treatment, the patient has consented to  Procedure(s): INTRAUTERINE DEVICE (IUD) REMOVAL (N/A) OPERATIVE ULTRASOUND (N/A) as a surgical intervention.  The patient's history has been reviewed, patient examined, no change in status, stable for surgery.  I have reviewed the patient's chart and labs.  Questions were answered to the patient's satisfaction.     [name]

## 2022-01-30 NOTE — Anesthesia Postprocedure Evaluation (Signed)
Anesthesia Post Note  Patient: Courtney Colon  Procedure(s) Performed: INTRAUTERINE DEVICE (IUD) REMOVAL OPERATIVE ULTRASOUND     Patient location during evaluation: PACU Anesthesia Type: MAC Level of consciousness: awake and alert Pain management: pain level controlled Vital Signs Assessment: post-procedure vital signs reviewed and stable Respiratory status: spontaneous breathing, nonlabored ventilation, respiratory function stable and patient connected to nasal cannula oxygen Cardiovascular status: stable and blood pressure returned to baseline Postop Assessment: no apparent nausea or vomiting Anesthetic complications: no  No notable events documented.  Last Vitals:  Vitals:   01/30/22 1200 01/30/22 1215  BP: 108/74 99/66  Pulse: 75 78  Resp: 16 16  Temp:  36.9 C  SpO2: 96% 96%    Last Pain:  Vitals:   01/30/22 1130  TempSrc:   PainSc: 0-No pain                 Callaway Hailes L Zakariye Nee

## 2022-01-31 ENCOUNTER — Encounter (HOSPITAL_COMMUNITY): Payer: Self-pay | Admitting: Family Medicine

## 2022-02-01 ENCOUNTER — Inpatient Hospital Stay (HOSPITAL_COMMUNITY): Payer: Medicaid Other

## 2022-02-01 ENCOUNTER — Encounter (HOSPITAL_COMMUNITY): Payer: Self-pay | Admitting: Obstetrics and Gynecology

## 2022-02-01 ENCOUNTER — Inpatient Hospital Stay (HOSPITAL_COMMUNITY)
Admission: AD | Admit: 2022-02-01 | Discharge: 2022-02-02 | Disposition: A | Discharge status: Home / Self Care | Payer: Medicaid Other | Attending: Obstetrics and Gynecology | Admitting: Obstetrics and Gynecology

## 2022-02-01 DIAGNOSIS — Z3A08 8 weeks gestation of pregnancy: Secondary | ICD-10-CM

## 2022-02-01 DIAGNOSIS — O021 Missed abortion: Secondary | ICD-10-CM | POA: Insufficient documentation

## 2022-02-01 DIAGNOSIS — O26891 Other specified pregnancy related conditions, first trimester: Secondary | ICD-10-CM | POA: Insufficient documentation

## 2022-02-01 DIAGNOSIS — O0941 Supervision of pregnancy with grand multiparity, first trimester: Secondary | ICD-10-CM | POA: Diagnosis not present

## 2022-02-01 DIAGNOSIS — Z6740 Type O blood, Rh positive: Secondary | ICD-10-CM

## 2022-02-01 DIAGNOSIS — M549 Dorsalgia, unspecified: Secondary | ICD-10-CM | POA: Insufficient documentation

## 2022-02-01 LAB — URINALYSIS, ROUTINE W REFLEX MICROSCOPIC
Bilirubin Urine: NEGATIVE
Glucose, UA: NEGATIVE mg/dL
Ketones, ur: NEGATIVE mg/dL
Nitrite: NEGATIVE
Protein, ur: NEGATIVE mg/dL
RBC / HPF: >50 RBC/hpf — ABNORMAL HIGH (ref 0–5)
Specific Gravity, Urine: 1.02 (ref 1.005–1.030)
pH: 7 (ref 5.0–8.0)

## 2022-02-01 LAB — CBC
HCT: 35.3 % — ABNORMAL LOW (ref 36.0–46.0)
Hemoglobin: 12.1 g/dL (ref 12.0–15.0)
MCH: 31 pg (ref 26.0–34.0)
MCHC: 34.3 g/dL (ref 30.0–36.0)
MCV: 90.5 fL (ref 80.0–100.0)
Platelets: 215 10*3/uL (ref 150–400)
RBC: 3.9 MIL/uL (ref 3.87–5.11)
RDW: 12.3 % (ref 11.5–15.5)
WBC: 8.3 10*3/uL (ref 4.0–10.5)
nRBC: 0 % (ref 0.0–0.2)

## 2022-02-01 NOTE — MAU Note (Signed)
.  Courtney Colon is a 27 y.o. at [redacted]w[redacted]d here in MAU reporting: IUD removal in on Friday, due to pregnancy while IUD in place. Reports that they informed her to come back in if she began to pass large clots or have heavy bleeding. She has been having light bleeding and is passing small blood clots, she reports passing gray tissue. She reports that she is changing pads every two hours, but is not saturating them. She is having abdominal and back cramping (5/10) that has been constant since her operation.   LMP: She is unsure, but thinks her last period may have been 12/10/2021 Onset of complaint: today Pain score: 5/10 Vitals:   02/01/22 2238  BP: 129/77  Pulse: 85  Resp: 16  Temp: 98.3 F (36.8 C)  SpO2: 96%     FHT: not indicated Lab orders placed from triage:  UA

## 2022-02-01 NOTE — MAU Provider Note (Signed)
History     CSN: [id number]  Arrival date and time: 02/01/22 2220   Event Date/Time   First Provider Initiated Contact with Patient 02/01/22 2259      Chief Complaint  Patient presents with   Abdominal Pain   Back Pain   Vaginal Bleeding   Courtney Colon is a 27 y.o. G6P5005 at [redacted]w[redacted]d who presents today with cramping and bleeding. She is currently pregnant and had an IUD removed on 01/30/2021. She was told that she would likely have some bleeding and cramping and that has continued. However, today she is also passing clots and she believes she is passing tissue. It is gray.   Vaginal Bleeding The patient's primary symptoms include pelvic pain and vaginal bleeding. This is a new problem. The current episode started in the past 7 days. The problem occurs constantly. The problem has been unchanged. The problem affects both sides. She is pregnant. The vaginal discharge was bloody. The vaginal bleeding is heavier than menses. She has been passing clots. She has been passing tissue. Nothing aggravates the symptoms. She has tried nothing for the symptoms.    OB History     Gravida  6   Para  5   Term  5   Preterm      AB      Living  5      SAB      IAB      Ectopic      Multiple  0   Live Births  5           Past Medical History:  Diagnosis Date   Anemia    Hx of varicella    Pyelonephritis affecting pregnancy in third trimester     Past Surgical History:  Procedure Laterality Date   CESAREAN SECTION N/A 01/11/2015   Procedure: CESAREAN SECTION;  Surgeon: Philip Aspen, DO;  Location: [facility];  Service: Obstetrics;  Laterality: N/A;   IUD REMOVAL N/A 01/30/2022   Procedure: INTRAUTERINE DEVICE (IUD) REMOVAL;  Surgeon: Reva Bores, MD;  Location: [facility];  Service: Gynecology;  Laterality: N/A;   MOUTH SURGERY  2011   OPERATIVE ULTRASOUND N/A 01/30/2022   Procedure: OPERATIVE ULTRASOUND;  Surgeon: Reva Bores, MD;  [facility];  Service: Gynecology;   Laterality: N/A;   WISDOM TOOTH EXTRACTION      Family History  Problem Relation Age of Onset   Hypertension Mother     Social History   Tobacco Use   Smoking status: Never   Smokeless tobacco: Never  Vaping Use   Vaping Use: Never used  Substance Use Topics   Alcohol use: No   Drug use: No    Allergies: No Known Allergies  Medications Prior to Admission  Medication Sig Dispense Refill Last Dose   acetaminophen (TYLENOL) 325 MG tablet Take 650 mg by mouth every 6 (six) hours as needed for moderate pain.      Ferrous Sulfate (IRON PO) Take 1 tablet by mouth 2 (two) times daily.       Review of Systems  Genitourinary:  Positive for pelvic pain.  All other systems reviewed and are negative.  Physical Exam   Blood pressure 125/84, pulse 85, temperature 98.3 F (36.8 C), temperature source Oral, resp. rate 16, height 5' (1.524 m), weight 68.4 kg, last menstrual period 11/09/2021, SpO2 96 %, unknown if currently breastfeeding.  Physical Exam Constitutional:      Appearance: She is well-developed.  HENT:  Head: Normocephalic.  Eyes:     Pupils: Pupils are equal, round, and reactive to light.  Cardiovascular:     Rate and Rhythm: Normal rate.  Pulmonary:     Effort: Pulmonary effort is normal. No respiratory distress.  Abdominal:     Palpations: Abdomen is soft.     Tenderness: There is no abdominal tenderness.  Genitourinary:    Vagina: No bleeding. Vaginal discharge: mucusy.    Comments: External: no lesion Vagina: small amount of white discharge     Musculoskeletal:        General: Normal range of motion.     Cervical back: Normal range of motion and neck supple.  Skin:    General: Skin is warm and dry.  Neurological:     Mental Status: She is alert and oriented to person, place, and time.  Psychiatric:        Mood and Affect: Mood normal.        Behavior: Behavior normal.    Results for orders placed or performed during the hospital encounter of  02/01/22 (from the past 24 hour(s))  Urinalysis, Routine w reflex microscopic     Status: Abnormal   Collection Time: 02/01/22 10:59 PM  Result Value Ref Range   Color, Urine YELLOW YELLOW   APPearance HAZY (A) CLEAR   Specific Gravity, Urine 1.020 1.005 - 1.030   pH 7.0 5.0 - 8.0   Glucose, UA NEGATIVE NEGATIVE mg/dL   Hgb urine dipstick LARGE (A) NEGATIVE   Bilirubin Urine NEGATIVE NEGATIVE   Ketones, ur NEGATIVE NEGATIVE mg/dL   Protein, ur NEGATIVE NEGATIVE mg/dL   Nitrite NEGATIVE NEGATIVE   Leukocytes,Ua TRACE (A) NEGATIVE   RBC / HPF >50 (H) 0 - 5 RBC/hpf   WBC, UA 6-10 0 - 5 WBC/hpf   Bacteria, UA FEW (A) NONE SEEN   Squamous Epithelial / HPF 0-5 0 - 5 /HPF   Mucus PRESENT   CBC     Status: Abnormal   Collection Time: 02/01/22 11:08 PM  Result Value Ref Range   WBC 8.3 4.0 - 10.5 K/uL   RBC 3.90 3.87 - 5.11 MIL/uL   Hemoglobin 12.1 12.0 - 15.0 g/dL   HCT 08.6 (L) 76.1 - 95.0 %   MCV 90.5 80.0 - 100.0 fL   MCH 31.0 26.0 - 34.0 pg   MCHC 34.3 30.0 - 36.0 g/dL   RDW 93.2 67.1 - 24.5 %   Platelets 215 150 - 400 K/uL   nRBC 0.0 0.0 - 0.2 %    US OB Transvaginal  Result Date: 02/02/2022 CLINICAL DATA:  Bleeding, IUP an IUD removal 01/30/2022. EXAM: TRANSVAGINAL OB ULTRASOUND TECHNIQUE: Transvaginal ultrasound was performed for complete evaluation of the gestation as well as the maternal uterus, adnexal regions, and pelvic cul-de-sac. COMPARISON:  01/23/2022. FINDINGS: Intrauterine gestational sac: Single Yolk sac:  Yes Embryo:  Yes Cardiac Activity: No Heart Rate: None. CRL:   16.6 mm   8 w 1 d Subchorionic hemorrhage:  None visualized. Maternal uterus/adnexae: The left ovary is within normal limits. The right ovary is not visualized on exam. No free fluid in the pelvis. IMPRESSION: Single intrauterine gestational sac containing yolk sac and fetal pole. No cardiac activity is identified. Findings meet definitive criteria for failed pregnancy. This follows SRU consensus  guidelines: Diagnostic Criteria for Nonviable Pregnancy Early in the First Trimester. [facility] Mis J Med [phone]. Electronically Signed   By: Thornell Sartorius M.D.   On: 02/02/2022 00:17  MAU Course  Procedures  MDM Patient with IUP and now no cardiac activity notified. Reviewed with patient that this is a miscarriage, but she still has the pregnancy to pass. Offered cytotec today to assist with passing the tissue v expectant management. Patient unsure if she wants to proceed with cytotec. She would like to have the RX provided so that she can think about it and take it at home if desired. RX provided and patient given instructions on how to take it at home.   Assessment and Plan   1. Missed abortion   2. [redacted] weeks gestation of pregnancy   3. Type O blood, Rh positive    DC home in stable condition  Comfort measures reviewed  Bleeding precautions RX: cytotec 840mcg buccal q 24 hours x 2 doses, ibuprofen 800mg  PRN #30, zofran 8mg  ODT PRN #20, Oxycodone PRN #15  Return to MAU as needed FU with OB as planned   [facility] [place] at [place] Follow up in 2 week(s).   Specialty: Obstetrics and Gynecology Why: They will call you with an appointment Contact information: [place], [place], [place]  02/02/22  12:47 AM

## 2022-02-02 DIAGNOSIS — O021 Missed abortion: Secondary | ICD-10-CM

## 2022-02-02 DIAGNOSIS — Z3A08 8 weeks gestation of pregnancy: Secondary | ICD-10-CM

## 2022-02-02 MED ORDER — MISOPROSTOL 200 MCG PO TABS: 800.0000 ug | ORAL_TABLET | Freq: Once | ORAL | Status: DC

## 2022-02-02 MED ORDER — MISOPROSTOL 200 MCG PO TABS: ORAL_TABLET | ORAL | 0 refills | Status: DC

## 2022-02-02 MED ORDER — OXYCODONE-ACETAMINOPHEN 5-325 MG PO TABS: 1.0000 | ORAL_TABLET | Freq: Four times a day (QID) | ORAL | 0 refills | Status: AC | PRN

## 2022-02-02 MED ORDER — IBUPROFEN 800 MG PO TABS: 800.0000 mg | ORAL_TABLET | Freq: Once | ORAL | Status: DC

## 2022-02-02 MED ORDER — ONDANSETRON 4 MG PO TBDP: 8.0000 mg | ORAL_TABLET | Freq: Once | ORAL | Status: DC

## 2022-02-02 MED ORDER — ONDANSETRON 8 MG PO TBDP: 8.0000 mg | ORAL_TABLET | Freq: Three times a day (TID) | ORAL | 0 refills | Status: DC | PRN

## 2022-02-02 MED ORDER — IBUPROFEN 800 MG PO TABS: 800.0000 mg | ORAL_TABLET | Freq: Three times a day (TID) | ORAL | 0 refills | Status: DC | PRN

## 2022-02-05 ENCOUNTER — Telehealth: Payer: Self-pay | Admitting: *Deleted

## 2022-02-05 DIAGNOSIS — O021 Missed abortion: Secondary | ICD-10-CM

## 2022-02-05 NOTE — Telephone Encounter (Cosign Needed)
Pt states that was given Cytotec in MAU on 02/01/22 for retained POC.  She states that she feels like she has now passed the tissue as she had "bad uterine cramping" and passed lots of tissue last night.  .  Per pt she does not want to take Cytotec now.  She is offered an U/S in 1 week to confirm that she has passed the POC per Dr Damita Dunnings.

## 2022-02-09 ENCOUNTER — Ambulatory Visit: Payer: Medicaid Other | Admitting: Family Medicine

## 2022-02-10 ENCOUNTER — Ambulatory Visit (INDEPENDENT_AMBULATORY_CARE_PROVIDER_SITE_OTHER): Payer: Medicaid Other

## 2022-02-10 DIAGNOSIS — O021 Missed abortion: Secondary | ICD-10-CM

## 2022-02-10 DIAGNOSIS — Z3A08 8 weeks gestation of pregnancy: Secondary | ICD-10-CM | POA: Diagnosis not present

## 2022-02-11 ENCOUNTER — Other Ambulatory Visit: Payer: Self-pay | Admitting: *Deleted

## 2022-02-11 MED ORDER — MISOPROSTOL 200 MCG PO TABS: ORAL_TABLET | ORAL | 0 refills | Status: DC

## 2022-02-11 NOTE — Progress Notes (Signed)
RX sent to [facility] for Cytotec 200mg  to be placed 800mg  into vagina 6 hrs prior to procedure per VO Dr Damita Dunnings.

## 2022-02-16 ENCOUNTER — Telehealth: Payer: Self-pay

## 2022-02-16 ENCOUNTER — Telehealth: Payer: Self-pay | Admitting: Obstetrics and Gynecology

## 2022-02-16 NOTE — Progress Notes (Signed)
PCP - None Cardiologist - n/a  Chest x-ray - n/a EKG - n/a Stress Test - n/a ECHO - n/a Cardiac Cath - n/a  ICD Pacemaker/Loop - n/a  Sleep Study -  n/a CPAP - none  STOP now taking any Aspirin (unless otherwise instructed by your surgeon), Aleve, Naproxen, Ibuprofen, Motrin, Advil, Goody's, BC's, all herbal medications, fish oil, and all vitamins.   Coronavirus Screening Do you have any of the following symptoms:  Cough yes/no: No Fever (>100.105F)  yes/no: No Runny nose yes/no: No Sore throat yes/no: No Difficulty breathing/shortness of breath  yes/no: No  Have you traveled in the last 14 days and where? yes/no: No  Patient verbalized understanding of instructions that were given via phone

## 2022-02-16 NOTE — Telephone Encounter (Signed)
Called patient, surgery date, time, location, and preop instructions given, patient expressed understanding.

## 2022-02-16 NOTE — Telephone Encounter (Signed)
Called patient to review [place] results and plan of care and options in more depth after I reviewed her [place] report and images.    Historically, she had an IUD in and then conceived with her IUD in. We tried to remove it in the office but I was unsuccessful. She then went to the OR and had it removed successfully, but then unfortunately had a miscarriage 2 days later (1/7). She did cytotec to manage it through the MAU. She had a follow up US which showed [place] which is what we are following up on today.   We discussed her options of additional cytotec (she declines) and then office MVA vs OR D&C. After discussing the risks and benefits of each, she would prefer to have surgery through the OR rather than the office procedure.   Will try to schedule for Wednesday.   She also thinks following this, she would like a tubal. We will schedule her for 2 week postop to sign papers and then get her scheduled with Dr. Mikel Cella for tubal/salpingectomy.   Message sent to Jordan. Work note placed through [employer] for her. Will also cancel appt for Thursday.   Radene Gunning, MD Attending [place], [facility] for [facility], [place]

## 2022-02-17 ENCOUNTER — Encounter (HOSPITAL_COMMUNITY): Payer: Self-pay | Admitting: Obstetrics and Gynecology

## 2022-02-18 ENCOUNTER — Other Ambulatory Visit: Payer: Self-pay

## 2022-02-18 ENCOUNTER — Ambulatory Visit (HOSPITAL_BASED_OUTPATIENT_CLINIC_OR_DEPARTMENT_OTHER): Payer: Medicaid Other | Admitting: Anesthesiology

## 2022-02-18 ENCOUNTER — Ambulatory Visit (HOSPITAL_COMMUNITY)
Admission: RE | Admit: 2022-02-18 | Discharge: 2022-02-18 | Disposition: A | Discharge status: Home / Self Care | Payer: Medicaid Other | Source: Ambulatory Visit | Attending: Obstetrics and Gynecology | Admitting: Obstetrics and Gynecology

## 2022-02-18 ENCOUNTER — Encounter (HOSPITAL_COMMUNITY): Payer: Self-pay | Admitting: Obstetrics and Gynecology

## 2022-02-18 ENCOUNTER — Ambulatory Visit (HOSPITAL_COMMUNITY): Payer: Medicaid Other | Admitting: Anesthesiology

## 2022-02-18 ENCOUNTER — Encounter (HOSPITAL_COMMUNITY)
Admission: RE | Disposition: A | Discharge status: Home / Self Care | Payer: Self-pay | Source: Ambulatory Visit | Attending: Obstetrics and Gynecology

## 2022-02-18 DIAGNOSIS — O034 Incomplete spontaneous abortion without complication: Secondary | ICD-10-CM | POA: Diagnosis present

## 2022-02-18 DIAGNOSIS — Z3A00 Weeks of gestation of pregnancy not specified: Secondary | ICD-10-CM | POA: Diagnosis not present

## 2022-02-18 DIAGNOSIS — O021 Missed abortion: Secondary | ICD-10-CM

## 2022-02-18 DIAGNOSIS — O0289 Other abnormal products of conception: Secondary | ICD-10-CM

## 2022-02-18 DIAGNOSIS — Z3A01 Less than 8 weeks gestation of pregnancy: Secondary | ICD-10-CM | POA: Insufficient documentation

## 2022-02-18 HISTORY — PX: DILATION AND EVACUATION: SHX1459

## 2022-02-18 LAB — TYPE AND SCREEN
ABO/RH(D): O POS
Antibody Screen: NEGATIVE

## 2022-02-18 SURGERY — DILATION AND EVACUATION, UTERUS
Anesthesia: Monitor Anesthesia Care | Site: Vagina

## 2022-02-18 MED ORDER — FENTANYL CITRATE (PF) 250 MCG/5ML IJ SOLN
INTRAMUSCULAR | Status: DC | PRN
  Administered 2022-02-18: 100 ug via INTRAVENOUS
  Administered 2022-02-18: 50 ug via INTRAVENOUS

## 2022-02-18 MED ORDER — ONDANSETRON HCL 4 MG/2ML IJ SOLN
INTRAMUSCULAR | Status: DC | PRN
  Administered 2022-02-18: 4 mg via INTRAVENOUS

## 2022-02-18 MED ORDER — OXYCODONE HCL 5 MG PO TABS: 5.0000 mg | ORAL_TABLET | Freq: Once | ORAL | Status: DC | PRN

## 2022-02-18 MED ORDER — POVIDONE-IODINE 10 % EX SWAB: 2.0000 | Freq: Once | CUTANEOUS | Status: DC

## 2022-02-18 MED ORDER — MIDAZOLAM HCL 2 MG/2ML IJ SOLN
INTRAMUSCULAR | Status: AC
  Filled 2022-02-18: qty 2

## 2022-02-18 MED ORDER — CHLORHEXIDINE GLUCONATE 0.12 % MT SOLN
15.0000 mL | OROMUCOSAL | Status: AC
  Administered 2022-02-18: 15 mL via OROMUCOSAL
  Filled 2022-02-18: qty 15

## 2022-02-18 MED ORDER — KETOROLAC TROMETHAMINE 15 MG/ML IJ SOLN
15.0000 mg | INTRAMUSCULAR | Status: AC
  Administered 2022-02-18: 15 mg via INTRAVENOUS
  Filled 2022-02-18: qty 1

## 2022-02-18 MED ORDER — ONDANSETRON HCL 4 MG/2ML IJ SOLN
INTRAMUSCULAR | Status: AC
  Filled 2022-02-18: qty 2

## 2022-02-18 MED ORDER — LIDOCAINE-EPINEPHRINE 1 %-1:100000 IJ SOLN
INTRAMUSCULAR | Status: DC | PRN
  Administered 2022-02-18: 20 mL

## 2022-02-18 MED ORDER — OXYCODONE HCL 5 MG/5ML PO SOLN: 5.0000 mg | Freq: Once | ORAL | Status: DC | PRN

## 2022-02-18 MED ORDER — 0.9 % SODIUM CHLORIDE (POUR BTL) OPTIME
TOPICAL | Status: DC | PRN
  Administered 2022-02-18: 1000 mL

## 2022-02-18 MED ORDER — KETOROLAC TROMETHAMINE 30 MG/ML IJ SOLN: 30.0000 mg | Freq: Once | INTRAMUSCULAR | Status: DC | PRN

## 2022-02-18 MED ORDER — ACETAMINOPHEN 500 MG PO TABS: 1000.0000 mg | ORAL_TABLET | ORAL | Status: AC

## 2022-02-18 MED ORDER — PROPOFOL 1000 MG/100ML IV EMUL
INTRAVENOUS | Status: AC
  Filled 2022-02-18: qty 100

## 2022-02-18 MED ORDER — ACETAMINOPHEN 500 MG PO TABS
1000.0000 mg | ORAL_TABLET | Freq: Once | ORAL | Status: AC
  Administered 2022-02-18: 1000 mg via ORAL
  Filled 2022-02-18: qty 2

## 2022-02-18 MED ORDER — LACTATED RINGERS IV SOLN: INTRAVENOUS | Status: DC

## 2022-02-18 MED ORDER — DOXYCYCLINE HYCLATE 100 MG IV SOLR
200.0000 mg | INTRAVENOUS | Status: AC
  Administered 2022-02-18: 200 mg via INTRAVENOUS
  Filled 2022-02-18: qty 200

## 2022-02-18 MED ORDER — MIDAZOLAM HCL 2 MG/2ML IJ SOLN
INTRAMUSCULAR | Status: DC | PRN
  Administered 2022-02-18: 2 mg via INTRAVENOUS

## 2022-02-18 MED ORDER — MEPERIDINE HCL 25 MG/ML IJ SOLN: 6.2500 mg | INTRAMUSCULAR | Status: DC | PRN

## 2022-02-18 MED ORDER — DIPHENHYDRAMINE HCL 50 MG/ML IJ SOLN
25.0000 mg | Freq: Once | INTRAMUSCULAR | Status: AC
  Administered 2022-02-18: 25 mg via INTRAVENOUS

## 2022-02-18 MED ORDER — PROPOFOL 10 MG/ML IV BOLUS
INTRAVENOUS | Status: DC | PRN
  Administered 2022-02-18: 100 ug/kg/min via INTRAVENOUS
  Administered 2022-02-18 (×2): 20 mg via INTRAVENOUS

## 2022-02-18 MED ORDER — AMISULPRIDE (ANTIEMETIC) 5 MG/2ML IV SOLN: 10.0000 mg | Freq: Once | INTRAVENOUS | Status: DC | PRN

## 2022-02-18 MED ORDER — FENTANYL CITRATE (PF) 250 MCG/5ML IJ SOLN
INTRAMUSCULAR | Status: AC
  Filled 2022-02-18: qty 5

## 2022-02-18 MED ORDER — KETOROLAC TROMETHAMINE 30 MG/ML IJ SOLN
INTRAMUSCULAR | Status: AC
  Filled 2022-02-18: qty 1

## 2022-02-18 MED ORDER — DEXAMETHASONE SODIUM PHOSPHATE 10 MG/ML IJ SOLN
INTRAMUSCULAR | Status: AC
  Filled 2022-02-18: qty 1

## 2022-02-18 MED ORDER — HYDROMORPHONE HCL 1 MG/ML IJ SOLN: 0.2500 mg | INTRAMUSCULAR | Status: DC | PRN

## 2022-02-18 MED ORDER — ONDANSETRON HCL 4 MG/2ML IJ SOLN: 4.0000 mg | Freq: Once | INTRAMUSCULAR | Status: DC | PRN

## 2022-02-18 MED ORDER — PHENYLEPHRINE 80 MCG/ML (10ML) SYRINGE FOR IV PUSH (FOR BLOOD PRESSURE SUPPORT)
PREFILLED_SYRINGE | INTRAVENOUS | Status: AC
  Filled 2022-02-18: qty 10

## 2022-02-18 MED ORDER — DEXAMETHASONE SODIUM PHOSPHATE 10 MG/ML IJ SOLN
INTRAMUSCULAR | Status: DC | PRN
  Administered 2022-02-18: 10 mg via INTRAVENOUS

## 2022-02-18 MED ORDER — PHENYLEPHRINE 80 MCG/ML (10ML) SYRINGE FOR IV PUSH (FOR BLOOD PRESSURE SUPPORT)
PREFILLED_SYRINGE | INTRAVENOUS | Status: DC | PRN
  Administered 2022-02-18 (×2): 80 ug via INTRAVENOUS

## 2022-02-18 MED ORDER — DIPHENHYDRAMINE HCL 50 MG/ML IJ SOLN
INTRAMUSCULAR | Status: AC
  Filled 2022-02-18: qty 1

## 2022-02-18 MED ORDER — LIDOCAINE 2% (20 MG/ML) 5 ML SYRINGE
INTRAMUSCULAR | Status: DC | PRN
  Administered 2022-02-18: 60 mg via INTRAVENOUS

## 2022-02-18 SURGICAL SUPPLY — 19 items
CATH ROBINSON RED A/P 16FR (CATHETERS) ×1 IMPLANT
FILTER UTR ASPR ASSEMBLY (MISCELLANEOUS) ×1 IMPLANT
GAUZE 4X4 16PLY ~~LOC~~+RFID DBL (SPONGE) ×1 IMPLANT
GLOVE BIOGEL PI IND STRL 7.0 (GLOVE) ×2 IMPLANT
GLOVE ECLIPSE 6.5 STRL STRAW (GLOVE) ×2 IMPLANT
GLOVE SURG ENC MOIS LTX SZ6 (GLOVE) ×1 IMPLANT
GOWN STRL REUS W/ TWL LRG LVL3 (GOWN DISPOSABLE) ×2 IMPLANT
GOWN STRL REUS W/TWL LRG LVL3 (GOWN DISPOSABLE) ×2
HOSE CONNECTING 18IN BERKELEY (TUBING) ×1 IMPLANT
KIT BERKELEY 1ST TRI 3/8 NO TR (MISCELLANEOUS) ×1 IMPLANT
KIT BERKELEY 1ST TRIMESTER 3/8 (MISCELLANEOUS) ×1 IMPLANT
NS IRRIG 1000ML POUR BTL (IV SOLUTION) ×1 IMPLANT
PACK VAGINAL MINOR WOMEN LF (CUSTOM PROCEDURE TRAY) ×1 IMPLANT
PAD OB MATERNITY 4.3X12.25 (PERSONAL CARE ITEMS) ×1 IMPLANT
SET BERKELEY SUCTION TUBING (SUCTIONS) ×1 IMPLANT
SPIKE FLUID TRANSFER (MISCELLANEOUS) ×1 IMPLANT
TOWEL GREEN STERILE FF (TOWEL DISPOSABLE) ×1 IMPLANT
UNDERPAD 30X36 HEAVY ABSORB (UNDERPADS AND DIAPERS) ×1 IMPLANT
VACURETTE 10 RIGID CVD (CANNULA) IMPLANT

## 2022-02-18 NOTE — Anesthesia Preprocedure Evaluation (Addendum)
Anesthesia Evaluation  Patient identified by MRN, date of birth, ID band Patient awake    Reviewed: Allergy & Precautions, NPO status , Patient's Chart, lab work & pertinent test results  Airway Mallampati: III  TM Distance: >3 FB Neck ROM: Full    Dental  (+) Teeth Intact, Dental Advisory Given   Pulmonary neg pulmonary ROS   Pulmonary exam normal breath sounds clear to auscultation       Cardiovascular negative cardio ROS Normal cardiovascular exam Rhythm:Regular Rate:Normal     Neuro/Psych negative neurological ROS  negative psych ROS   GI/Hepatic negative GI ROS, Neg liver ROS,,,  Endo/Other  negative endocrine ROS    Renal/GU negative Renal ROS  negative genitourinary   Musculoskeletal negative musculoskeletal ROS (+)    Abdominal   Peds  Hematology negative hematology ROS (+)   Anesthesia Other Findings   Reproductive/Obstetrics 8 weeks SAB                             Anesthesia Physical Anesthesia Plan  ASA: 1  Anesthesia Plan: MAC   Post-op Pain Management: Tylenol PO (pre-op)* and Toradol IV (intra-op)*   Induction:   PONV Risk Score and Plan: 2 and Propofol infusion and TIVA  Airway Management Planned: Natural Airway and Simple Face Mask  Additional Equipment: None  Intra-op Plan:   Post-operative Plan:   Informed Consent: I have reviewed the patients History and Physical, chart, labs and discussed the procedure including the risks, benefits and alternatives for the proposed anesthesia with the patient or authorized representative who has indicated his/her understanding and acceptance.       Plan Discussed with: CRNA  Anesthesia Plan Comments:         Anesthesia Quick Evaluation

## 2022-02-18 NOTE — H&P (Signed)
Faculty Practice Obstetrics and Gynecology Attending History and Physical  Quintella Mura is a 27 y.o. [id number] who presents for D&E due to [facility].   Historically, she had an IUD in and then conceived with her IUD in. We tried to remove it in the office but I was unsuccessful. She then went to the OR and had it removed successfully, but then unfortunately had a miscarriage 2 days later (1/7). She did cytotec to manage it through the MAU. She had a follow up US which showed [place] which is what we are following up on today.   She was offered repeat cytotec with MVA in the office but pt declined.   She has had continued intermittent vaginal bleeding that sounds c/w RPOC.    Past Medical History:  Diagnosis Date   Anemia    Hx of varicella    Pyelonephritis affecting pregnancy in third trimester    Past Surgical History:  Procedure Laterality Date   CESAREAN SECTION N/A 01/11/2015   Procedure: CESAREAN SECTION;  Surgeon: Allyn Kenner, DO;  Location: [place] [facility];  Service: Obstetrics;  Laterality: N/A;   IUD REMOVAL N/A 01/30/2022   Procedure: INTRAUTERINE DEVICE (IUD) REMOVAL;  Surgeon: [name], MD;  Location: [place];  Service: Gynecology;  Laterality: N/A;   MOUTH SURGERY  01/26/2009   wisdom teeth ext   OPERATIVE ULTRASOUND N/A 01/30/2022   Procedure: OPERATIVE ULTRASOUND;  Surgeon: [name], MD;  Location: [place];  Service: Gynecology;  Laterality: N/A;   OB History  Gravida Para Term Preterm AB Living  6 5 5     5   SAB IAB Ectopic Multiple Live Births        0 5    # Outcome Date GA Lbr Len/2nd Weight Sex Delivery Anes PTL Lv  6 Gravida           5 Term 01/21/20 [redacted]w[redacted]d 04:43 / 00:08 3399 g M VBAC EPI  LIV  4 Term 12/05/18 [redacted]w[redacted]d 05:12 / 00:02 3080 g F VBAC EPI  LIV  3 Term 07/27/17 [redacted]w[redacted]d 04:20 / 00:32 4000 g F Vag-Spont EPI  LIV  2 Term 02/25/16 [redacted]w[redacted]d 07:16 / 01:46 3830 g M VBAC EPI  LIV  1 Term 01/11/15 [redacted]w[redacted]d 10:11 / 04:31 2985 g F CS-LTranv EPI  LIV     Birth Comments:  Newborn Screen KD#[id number] Barcode: [id number] Date collected: 01/12/2015 Hgb-normal, FA  Patient denies any other pertinent gynecologic issues.  No current facility-administered medications on file prior to encounter.   Current Outpatient Medications on File Prior to Encounter  Medication Sig Dispense Refill   acetaminophen (TYLENOL) 325 MG tablet Take 650 mg by mouth every 6 (six) hours as needed for moderate pain.     Ferrous Sulfate (IRON PO) Take 1 tablet by mouth 2 (two) times daily.     ibuprofen (ADVIL) 800 MG tablet Take 1 tablet (800 mg total) by mouth every 8 (eight) hours as needed. (Patient taking differently: Take 800 mg by mouth every 8 (eight) hours as needed for mild pain or headache.) 30 tablet 0   ondansetron (ZOFRAN-ODT) 8 MG disintegrating tablet Take 1 tablet (8 mg total) by mouth every 8 (eight) hours as needed for nausea or vomiting. 20 tablet 0   misoprostol (CYTOTEC) 200 MCG tablet Take 4 tablets buccally once every 24 hours for 2 doses (Patient not taking: Reported on 02/16/2022) 8 tablet 0   misoprostol (CYTOTEC) 200 MCG tablet Place all 4 tablets into vagina 6 hrs prior  to procedure (Patient not taking: Reported on 02/16/2022) 4 tablet 0   No Known Allergies  Social History:   reports that she has never smoked. She has never used smokeless tobacco. She reports that she does not drink alcohol and does not use drugs. Family History  Problem Relation Age of Onset   Hypertension Mother     Review of Systems: Pertinent items noted in HPI and remainder of comprehensive ROS otherwise negative.  PHYSICAL EXAM: Blood pressure 133/88, pulse 86, temperature 98.1 F (36.7 C), temperature source Oral, resp. rate 16, height 4\' 10"  (1.473 m), weight 65.8 kg, last menstrual period 11/09/2021, SpO2 98 %, unknown if currently breastfeeding. CONSTITUTIONAL: Well-developed, well-nourished female in no acute distress.  HENT:  Normocephalic, atraumatic, External right and left ear  normal. Oropharynx is clear and moist EYES: Conjunctivae and EOM are normal. Pupils are equal, round, and reactive to light. No scleral icterus.  NECK: Normal range of motion, supple, no masses SKIN: Skin is warm and dry. No rash noted. Not diaphoretic. No erythema. No pallor. NEUROLOGIC: Alert and oriented to person, place, and time. Normal reflexes, muscle tone coordination. No cranial nerve deficit noted. PSYCHIATRIC: Normal mood and affect. Normal behavior. Normal judgment and thought content. CARDIOVASCULAR: Normal heart rate noted, regular rhythm RESPIRATORY: Effort and breath sounds normal, no problems with respiration noted ABDOMEN: Soft, nontender, nondistended. PELVIC: Not examined MUSCULOSKELETAL: Normal range of motion. No tenderness.  No cyanosis, clubbing, or edema.  2+ distal pulses.  Labs: No results found for this or any previous visit (from the past 336 hour(s)).  Imaging Studies: US OB LESS THAN 14 WEEKS WITH OB TRANSVAGINAL  Result Date: 02/11/2022 CLINICAL DATA:  Missed abortion with retained products of conception. EXAM: OBSTETRIC <14 WK [place] AND TRANSVAGINAL OB US TECHNIQUE: Both transabdominal and transvaginal ultrasound examinations were performed for complete evaluation of the gestation as well as the maternal uterus, adnexal regions, and pelvic cul-de-sac. Transvaginal technique was performed to assess early pregnancy. COMPARISON:  02/01/2022. FINDINGS: Intrauterine gestational sac: None Yolk sac:  No Embryo:  No Cardiac Activity: No Heart Rate: None Maternal uterus/adnexae: Hyperechoic region is noted in the endometrium measuring 1.8 x 1.6 x 2.5 cm with hypervascularity. The endometrium is thickened measuring 1.8 cm. The ovaries are normal in size. There is a cystic structure with a thick rim in the right ovary measuring 1.5 cm, likely corpus luteal cyst. No free fluid in the pelvis. IMPRESSION: 1. No evidence of intrauterine pregnancy. 2. Thickened endometrium with  hyperechoic material in the endometrial cavity with hypervascularity measuring 1.8 x 1.6 x 2.5 cm, concerning for retained products of conception. Electronically Signed   By: Brett Fairy M.D.   On: 02/11/2022 03:53   US OB Transvaginal  Result Date: 02/02/2022 CLINICAL DATA:  Bleeding, IUP an IUD removal 01/30/2022. EXAM: TRANSVAGINAL OB ULTRASOUND TECHNIQUE: Transvaginal ultrasound was performed for complete evaluation of the gestation as well as the maternal uterus, adnexal regions, and pelvic cul-de-sac. COMPARISON:  01/23/2022. FINDINGS: Intrauterine gestational sac: Single Yolk sac:  Yes Embryo:  Yes Cardiac Activity: No Heart Rate: None. CRL:   16.6 mm   8 w 1 d Subchorionic hemorrhage:  None visualized. Maternal uterus/adnexae: The left ovary is within normal limits. The right ovary is not visualized on exam. No free fluid in the pelvis. IMPRESSION: Single intrauterine gestational sac containing yolk sac and fetal pole. No cardiac activity is identified. Findings meet definitive criteria for failed pregnancy. This follows SRU consensus guidelines: Diagnostic Criteria for Nonviable  Pregnancy Early in the First Trimester. [facility] Mis J Med [phone]. Electronically Signed   By: Thornell Sartorius M.D.   On: 02/02/2022 00:17   [place] Intraoperative  Result Date: 01/30/2022 CLINICAL DATA:  Ultrasound was provided for use by the ordering physician.  No provider Interpretation or professional fees incurred.    US OB LESS THAN 14 WEEKS WITH OB TRANSVAGINAL  Result Date: 01/23/2022 CLINICAL DATA:  IUD with positive urine pregnancy test. Serum beta HCG in progress. EXAM: OBSTETRIC <14 WK [place] AND TRANSVAGINAL OB US TECHNIQUE: Both transabdominal and transvaginal ultrasound examinations were performed for complete evaluation of the gestation as well as the maternal uterus, adnexal regions, and pelvic cul-de-sac. Transvaginal technique was performed to assess early pregnancy. COMPARISON:  None Available. FINDINGS:  Intrauterine device is present within the endometrium tilted in an oblique angle and appears separate from the gestational sac but appears associated with a large subchorionic hemorrhage. Intrauterine gestational sac: Single Yolk sac:  Visualized. Embryo:  Visualized. Cardiac Activity: Visualized. Heart Rate: 133 bpm CRL:  9.0 mm   6 w   6 d                  [place] EDC: November 12, 2022 Subchorionic hemorrhage:  Large subchorionic hemorrhage Maternal uterus/adnexae: Physiologic corpus luteum in the right ovary. IMPRESSION: Early intrauterine pregnancy at 6 weeks and 6 days with a large subchorionic hemorrhage that is associated with an intrauterine contraceptive device. Electronically Signed   By: Maudry Mayhew M.D.   On: 01/23/2022 19:53    Assessment: Active Problems:   Retained products of conception after miscarriage   Plan: - Diagnosis: RPOC - Planned surgery: D&E  - Risks of surgery include but are not limited to: bleeding, infection, injury to surrounding organs/tissues (i.e. bowel/bladder/ureters), need for additional procedures, wound complications, hospital re-admission, and conversion to open surgery, VTE, additional complications: Uterine perforation - We discussed postop restrictions, precautions and expectations - Preop testing needed: None - ABX: Doxycycline - All questions answered   Milas Hock, MD, FACOG Obstetrician & Gynecologist, [facility] for [facility], [facility]

## 2022-02-18 NOTE — Transfer of Care (Signed)
Immediate Anesthesia Transfer of Care Note  Patient: Courtney Colon  Procedure(s) Performed: DILATATION AND EVACUATION (Vagina )  Patient Location: PACU  Anesthesia Type:MAC  Level of Consciousness: awake and alert   Airway & Oxygen Therapy: Patient connected to face mask oxygen  Post-op Assessment: Report given to RN and Post -op Vital signs reviewed and stable  Post vital signs: Reviewed and stable  Last Vitals:  Vitals Value Taken Time  BP 105/67 02/18/22 1211  Temp 36.6 C 02/18/22 1210  Pulse 103 02/18/22 1212  Resp 21 02/18/22 1212  SpO2 98 % 02/18/22 1212  Vitals shown include unvalidated device data.  Last Pain:  Vitals:   02/18/22 1210  TempSrc:   PainSc: Asleep      Patients Stated Pain Goal: 0 (02/77/41 2878)  Complications: There were no known notable events for this encounter.

## 2022-02-18 NOTE — Anesthesia Postprocedure Evaluation (Signed)
Anesthesia Post Note  Patient: Courtney Colon  Procedure(s) Performed: DILATATION AND EVACUATION (Vagina )     Patient location during evaluation: PACU Anesthesia Type: MAC Level of consciousness: awake and alert Pain management: pain level controlled Vital Signs Assessment: post-procedure vital signs reviewed and stable Respiratory status: spontaneous breathing, nonlabored ventilation and respiratory function stable Cardiovascular status: blood pressure returned to baseline and stable Postop Assessment: no apparent nausea or vomiting Anesthetic complications: no   There were no known notable events for this encounter.  Last Vitals:  Vitals:   02/18/22 1245 02/18/22 1300  BP: 117/76 113/78  Pulse: 61 63  Resp: 16 13  Temp:    SpO2: 98% 99%    Last Pain:  Vitals:   02/18/22 1300  TempSrc:   PainSc: [place]

## 2022-02-18 NOTE — Op Note (Signed)
Preop Diagnosis: Retained products of conception Postop Diagnosis: Same Procedure: Dilation and evacuation Surgeon: Dr. Damita Dunnings Assistant: Dr. Naaman Plummer Aut[place]Lott Assist: None Anesthesia: IV sedation, 1% lidocaine with epinephrine  EBL: 25 cc IVF: 500 cc  UOP: Not drained Complications: None  Findings: Normal anteverted uterus, products of conception noted  Description of the procedure: Preop antibiotics of doxycycline given. Informed consent reviewed and signed. Pt given opportunity to ask questions.   Pt prepped and draped in the dorsal lithotomy fashion after IV sedation anesthesia found to be adequate. Timeout performed.   Open-sided speculum placed into the vagina. Cervix grasped with a single tooth tenaculum. Paracervical block performed at 4 and 8 oclock. Cervix progressively dilated to a 31 pratt.  I used a 10 mm rigid curette. Several passes done with the suction curette and the tissue was retrieved. A gentle pass was done with a 1 curette. A gritty texture noted in all areas of the uterus confirming complete evacuation. Minimal bleeding noted. Procedure completed. All instruments removed. Counts correct x2.   Products of conception sent to pathology. Rh Positive - rhogam not indicated  Pt taken to recovery room in stable condition.  Radene Gunning, MD Attending [place] for [facility], [place]

## 2022-02-19 ENCOUNTER — Encounter (HOSPITAL_COMMUNITY): Payer: Self-pay | Admitting: Obstetrics and Gynecology

## 2022-02-19 ENCOUNTER — Ambulatory Visit: Payer: Medicaid Other | Admitting: Obstetrics and Gynecology

## 2022-02-19 LAB — SURGICAL PATHOLOGY

## 2022-02-23 ENCOUNTER — Ambulatory Visit (INDEPENDENT_AMBULATORY_CARE_PROVIDER_SITE_OTHER): Payer: Medicaid Other | Admitting: Obstetrics and Gynecology

## 2022-02-23 ENCOUNTER — Encounter: Payer: Self-pay | Admitting: Obstetrics and Gynecology

## 2022-02-23 VITALS — BP 116/74 | HR 90 | Temp 98.6°F | Ht <= 58 in | Wt 147.0 lb

## 2022-02-23 DIAGNOSIS — R399 Unspecified symptoms and signs involving the genitourinary system: Secondary | ICD-10-CM | POA: Diagnosis not present

## 2022-02-23 LAB — POCT URINALYSIS DIPSTICK
Appearance: NORMAL
Bilirubin, UA: NEGATIVE
Glucose, UA: NEGATIVE
Ketones, UA: NEGATIVE
Nitrite, UA: NEGATIVE
Protein, UA: POSITIVE — AB
Spec Grav, UA: 1.02 (ref 1.010–1.025)
Urobilinogen, UA: NEGATIVE E.U./dL — AB
pH, UA: 7 (ref 5.0–8.0)

## 2022-02-23 MED ORDER — PHENAZOPYRIDINE HCL 200 MG PO TABS: 200.0000 mg | ORAL_TABLET | Freq: Three times a day (TID) | ORAL | 0 refills | Status: DC | PRN

## 2022-02-23 MED ORDER — NITROFURANTOIN MONOHYD MACRO 100 MG PO CAPS: 100.0000 mg | ORAL_CAPSULE | Freq: Two times a day (BID) | ORAL | 0 refills | Status: AC

## 2022-02-23 NOTE — Progress Notes (Signed)
   RETURN GYNECOLOGY VISIT  Subjective:  Courtney Colon is a 27 y.o. [id number] s/p D&E on 1/24 presenting for UTI sxs  Reports dysuria, urinary frequency & hesitancy for the past few days. Feels urge to urinate q44min, only gets drops out. Painful.  No n/v/f/c.  Objective:   Vitals:   02/23/22 1450  BP: 116/74  Pulse: 90  Temp: 98.6 F (37 C)  Weight: 147 lb (66.7 kg)  Height: 4\' 10"  (1.473 m)    General:  Alert, oriented and cooperative. Patient is in no acute distress.  Skin: Skin is warm and dry. No rash noted.   Cardiovascular: Normal heart rate noted  Respiratory: Normal respiratory effort, no problems with respiration noted   Assessment and Plan:  Courtney Colon is a 27 y.o. with dysuria, urinary frequency & hesitancy  1. UTI symptoms -     Urine Culture -     POCT Urinalysis Dipstick - +LE -     nitrofurantoin, macrocrystal-monohydrate, (MACROBID) 100 MG capsule; Take 1 capsule (100 mg total) by mouth 2 (two) times daily for 5 days.  Future Appointments  Date Time Provider [place]  03/09/2022 10:50 AM Guss Bunde, MD [facility]    Inez Catalina, MD

## 2022-02-25 LAB — URINE CULTURE: Organism ID, Bacteria: NO GROWTH

## 2022-03-04 ENCOUNTER — Encounter: Payer: Medicaid Other | Admitting: Obstetrics and Gynecology

## 2022-03-05 ENCOUNTER — Encounter: Payer: Self-pay | Admitting: Obstetrics and Gynecology

## 2022-03-09 ENCOUNTER — Encounter: Payer: Self-pay | Admitting: Obstetrics & Gynecology

## 2022-03-09 ENCOUNTER — Ambulatory Visit (INDEPENDENT_AMBULATORY_CARE_PROVIDER_SITE_OTHER): Payer: Medicaid Other | Admitting: Obstetrics & Gynecology

## 2022-03-09 VITALS — BP 111/74 | HR 94 | Ht <= 58 in | Wt 148.0 lb

## 2022-03-09 DIAGNOSIS — E66811 Obesity, class 1: Secondary | ICD-10-CM | POA: Insufficient documentation

## 2022-03-09 DIAGNOSIS — E669 Obesity, unspecified: Secondary | ICD-10-CM | POA: Diagnosis not present

## 2022-03-09 DIAGNOSIS — Z30011 Encounter for initial prescription of contraceptive pills: Secondary | ICD-10-CM

## 2022-03-09 DIAGNOSIS — Z3009 Encounter for other general counseling and advice on contraception: Secondary | ICD-10-CM | POA: Insufficient documentation

## 2022-03-09 MED ORDER — NORETHIN ACE-ETH ESTRAD-FE 1-20 MG-MCG(24) PO TABS: 1.0000 | ORAL_TABLET | Freq: Every day | ORAL | 11 refills | Status: DC

## 2022-03-09 NOTE — Progress Notes (Signed)
   Subjective:    Patient ID: Courtney Colon, female    DOB: September 11, 1995, 27 y.o.   MRN: [id number]  HPI  Bettie is a 27 year old G6 P5-0-1-5 who presents post miscarriage to discuss contraception.  She is having no bleeding or bladder pain.  She is finished her antibiotics.  Patient is considering partial salpingectomy for herself as well as vasectomy for her husband.  She has never used hormonal birth control and has been hesitant to do so.  She also is hesitant to try another IUD as she got pregnant with a copper T IUD in place.  Review of Systems  Constitutional: Negative.   Respiratory: Negative.    Cardiovascular: Negative.   Gastrointestinal: Negative.   Genitourinary: Negative.        Objective:   Physical Exam Vitals reviewed.  Constitutional:      General: She is not in acute distress.    Appearance: She is well-developed.  HENT:     Head: Normocephalic and atraumatic.  Eyes:     Conjunctiva/sclera: Conjunctivae normal.  Cardiovascular:     Rate and Rhythm: Normal rate.  Pulmonary:     Effort: Pulmonary effort is normal.  Skin:    General: Skin is warm and dry.  Neurological:     Mental Status: She is alert and oriented to person, place, and time.  Psychiatric:        Mood and Affect: Mood normal.    Vitals:   03/09/22 1059  BP: 111/74  Pulse: 94  Weight: 148 lb (67.1 kg)  Height: 4\' 10"  (1.473 m)      Assessment & Plan:  27 year old G6 para 5-0-1-5 with undesired fertility.  We discussed the risks benefits and alternatives to female sterilization.  She is considering another IUD but would like to talk with her husband first.  She would also like to talk with him about vasectomy.  In the meantime, she will go on oral contraception to protect herself from pregnancy.  She has had intercourse once with a condom after her D&C.  She will start the oral contraception on the first day of her next menstrual cycle.  She will use condoms or abstinence until then.  If she  decides on another IUD, she will need premedication.  The IUD insertion was very painful and despite being a para 5.  I will call her in Cytotec, Xanax, and ibuprofen.  30 minutes was spent during this patient encounter including review of prior records, history and physical, contraceptive counseling, and documentation.

## 2022-05-27 ENCOUNTER — Telehealth: Payer: Self-pay

## 2022-05-27 NOTE — Telephone Encounter (Signed)
Returned pt call. Pt states she just started taking birth control four days ago and it is making her nauseous. Pt was told that it takes time for your body to get used to them medication so nausea can be a normal side effect. Pt was told to reach out to office if nausea doesn't start to subside soon. Pt expressed understanding.

## 2022-06-23 ENCOUNTER — Ambulatory Visit
Admission: RE | Admit: 2022-06-23 | Discharge: 2022-06-23 | Disposition: A | Discharge status: Home / Self Care | Payer: Medicaid Other | Source: Ambulatory Visit | Attending: Family Medicine | Admitting: Family Medicine

## 2022-06-23 VITALS — BP 139/86 | HR 102 | Temp 98.6°F | Resp 17

## 2022-06-23 DIAGNOSIS — H6691 Otitis media, unspecified, right ear: Secondary | ICD-10-CM

## 2022-06-23 MED ORDER — FLUCONAZOLE 150 MG PO TABS: ORAL_TABLET | ORAL | 0 refills | Status: DC

## 2022-06-23 MED ORDER — AMOXICILLIN 875 MG PO TABS: 875.0000 mg | ORAL_TABLET | Freq: Two times a day (BID) | ORAL | 0 refills | Status: AC

## 2022-06-23 NOTE — ED Provider Notes (Signed)
[facility]    CSN: [id number] Arrival date & time: 06/23/22  1909      History   Chief Complaint Chief Complaint  Patient presents with   Otalgia    RT    HPI Courtney Colon is a 27 y.o. female.   HPI 27 year old female presents with right ear pain for 1 month.  Reports was seen in the ED on 06/06/2022 and treated with antibiotic.  Patient was evaluated at [facility] for right otitis media and provided ENT follow-up. atient reports finished course last Wednesday.  PMH significant for obesity and anemia.  Past Medical History:  Diagnosis Date   Anemia    Hx of varicella    Pyelonephritis affecting pregnancy in third trimester     Patient Active Problem List   Diagnosis Date Noted   Obesity (BMI 30.0-34.9) 03/09/2022   Unwanted fertility 03/09/2022    Past Surgical History:  Procedure Laterality Date   CESAREAN SECTION N/A 01/11/2015   Procedure: CESAREAN SECTION;  Surgeon: Philip Aspen, DO;  Location: [facility];  Service: Obstetrics;  Laterality: N/A;   DILATION AND EVACUATION N/A 02/18/2022   Procedure: DILATATION AND EVACUATION;  Surgeon: Milas Hock, MD;  Location: [facility];  Service: Gynecology;  Laterality: N/A;   IUD REMOVAL N/A 01/30/2022   Procedure: INTRAUTERINE DEVICE (IUD) REMOVAL;  Surgeon: Reva Bores, MD;  Location: [facility] OR;  Service: Gynecology;  Laterality: N/A;   MOUTH SURGERY  01/26/2009   wisdom teeth ext   OPERATIVE ULTRASOUND N/A 01/30/2022   Procedure: OPERATIVE ULTRASOUND;  Surgeon: Reva Bores, MD;  Location: [facility] OR;  Service: Gynecology;  Laterality: N/A;    OB History     Gravida  6   Para  5   Term  5   Preterm      AB  1   Living  5      SAB  1   IAB      Ectopic      Multiple  0   Live Births  5            Home Medications    Prior to Admission medications   Medication Sig Start Date End Date Taking? Authorizing Provider  amoxicillin (AMOXIL) 875 MG tablet Take 1 tablet (875 mg total) by mouth 2  (two) times daily for 10 days. 06/23/22 07/03/22 Yes Trevor Iha, FNP  fluconazole (DIFLUCAN) 150 MG tablet Take 1 tab p.o. for vaginal candidiasis, may repeat 1 tab p.o. in 3 days if symptoms are not resolved. 06/23/22  Yes Trevor Iha, FNP  acetaminophen (TYLENOL) 325 MG tablet Take 650 mg by mouth every 6 (six) hours as needed for moderate pain. Patient not taking: Reported on 02/23/2022    [provider]  ibuprofen (ADVIL) 800 MG tablet Take 1 tablet (800 mg total) by mouth every 8 (eight) hours as needed. Patient taking differently: Take 800 mg by mouth every 8 (eight) hours as needed for mild pain or headache. 02/02/22   Thressa Sheller D, CNM  Norethindrone Acetate-Ethinyl Estrad-FE (LOESTRIN 24 FE) 1-20 MG-MCG(24) tablet Take 1 tablet by mouth daily. 03/09/22   Lesly Dukes, MD    Family History Family History  Problem Relation Age of Onset   Hypertension Mother     Social History Social History   Tobacco Use   Smoking status: Never   Smokeless tobacco: Never  Vaping Use   Vaping Use: Never used  Substance Use Topics   Alcohol use: No  Drug use: No     Allergies   Patient has no known allergies.   Review of Systems Review of Systems  HENT:  Positive for ear pain.   All other systems reviewed and are negative.    Physical Exam Triage Vital Signs ED Triage Vitals  Enc Vitals Group     BP      Pulse      Resp      Temp      Temp src      SpO2      Weight      Height      Head Circumference      Peak Flow      Pain Score      Pain Loc      Pain Edu?      Excl. in GC?    No data found.  Updated Vital Signs BP 139/86 (BP Location: Left Arm)   Pulse (!) 102   Temp 98.6 F (37 C) (Oral)   Resp 17   LMP 05/22/2022 (Exact Date)   SpO2 99%     Physical Exam Vitals and nursing note reviewed.  Constitutional:      Appearance: Normal appearance. She is obese.  HENT:     Head: Normocephalic and atraumatic.     Right Ear: Ear canal and  external ear normal.     Left Ear: Tympanic membrane, ear canal and external ear normal.     Ears:     Comments: Right TM: Red rimmed, erythematous, bulging    Mouth/Throat:     Mouth: Mucous membranes are moist.     Pharynx: Oropharynx is clear.  Eyes:     Extraocular Movements: Extraocular movements intact.     Conjunctiva/sclera: Conjunctivae normal.     Pupils: Pupils are equal, round, and reactive to light.  Cardiovascular:     Rate and Rhythm: Normal rate and regular rhythm.     Pulses: Normal pulses.     Heart sounds: Normal heart sounds.  Pulmonary:     Effort: Pulmonary effort is normal.     Breath sounds: Normal breath sounds. No wheezing, rhonchi or rales.  Musculoskeletal:        General: Normal range of motion.     Cervical back: Normal range of motion and neck supple.  Skin:    General: Skin is warm and dry.  Neurological:     General: No focal deficit present.     Mental Status: She is alert and oriented to person, place, and time. Mental status is at baseline.      UC Treatments / Results  Labs (all labs ordered are listed, but only abnormal results are displayed) Labs Reviewed - No data to display  EKG   Radiology No results found.  Procedures Procedures (including critical care time)  Medications Ordered in UC Medications - No data to display  Initial Impression / Assessment and Plan / UC Course  I have reviewed the triage vital signs and the nursing notes.  Pertinent labs & imaging results that were available during my care of the patient were reviewed by me and considered in my medical decision making (see chart for details).     MDM: 1.  Acute right otitis media-Rx'd amoxicillin 875 mg tablet twice daily x 10 days.  Encouraged ENT follow-up if unresolved. Advised patient to take medication as directed with food to completion.  Encouraged increase daily water intake to 64 ounces per day while taking this  medication.  Patient advised of risk of  taking back-to-back antibiotics for ear infection.  Encouraged patient to follow-up with ENT if symptoms worsen and/or unresolved.  Advised we will write Diflucan prophylactically for yeast infection.  Advised patient may establish primary care with [facility] care at [facility].  Contact information provided with AVS. Final Clinical Impressions(s) / UC Diagnoses   Final diagnoses:  Acute right otitis media     Discharge Instructions      Advised patient to take medication as directed with food to completion.  Encouraged increase daily water intake to 64 ounces per day while taking this medication.  Patient advised of risk of taking back-to-back antibiotics for ear infection.  Encouraged patient to follow-up with ENT if symptoms worsen and/or unresolved.  Advised we will write Diflucan prophylactically for yeast infection.  Advised patient may establish primary care with [facility] care at [facility].  Contact information provided.     ED Prescriptions     Medication Sig Dispense Auth. Provider   amoxicillin (AMOXIL) 875 MG tablet Take 1 tablet (875 mg total) by mouth 2 (two) times daily for 10 days. 20 tablet Trevor Iha, FNP   fluconazole (DIFLUCAN) 150 MG tablet Take 1 tab p.o. for vaginal candidiasis, may repeat 1 tab p.o. in 3 days if symptoms are not resolved. 7 tablet Trevor Iha, FNP      PDMP not reviewed this encounter.   Trevor Iha, FNP 06/23/22 [phone]

## 2022-06-23 NOTE — Discharge Instructions (Addendum)
Advised patient to take medication as directed with food to completion.  Encouraged increase daily water intake to 64 ounces per day while taking this medication.  Patient advised of risk of taking back-to-back antibiotics for ear infection.  Encouraged patient to follow-up with ENT if symptoms worsen and/or unresolved.  Advised we will write Diflucan prophylactically for yeast infection.  Advised patient may establish primary care with [facility] primary care at [facility].  Contact information provided.

## 2022-06-23 NOTE — ED Triage Notes (Signed)
Pt c/o RT ear pain x 1 month. Was seen in ED on 5/11. Tx with abx, finished course last wed. Some drainage noticed.

## 2022-06-24 ENCOUNTER — Ambulatory Visit: Payer: Medicaid Other

## 2022-06-26 ENCOUNTER — Other Ambulatory Visit: Payer: Self-pay

## 2022-06-26 ENCOUNTER — Ambulatory Visit
Admission: RE | Admit: 2022-06-26 | Discharge: 2022-06-26 | Disposition: A | Discharge status: Home / Self Care | Payer: Medicaid Other | Source: Ambulatory Visit | Attending: Family Medicine | Admitting: Family Medicine

## 2022-06-26 VITALS — BP 138/86 | HR 87 | Temp 97.6°F | Resp 19

## 2022-06-26 DIAGNOSIS — H6691 Otitis media, unspecified, right ear: Secondary | ICD-10-CM

## 2022-06-26 DIAGNOSIS — H9201 Otalgia, right ear: Secondary | ICD-10-CM

## 2022-06-26 MED ORDER — PREDNISONE 20 MG PO TABS: ORAL_TABLET | ORAL | 0 refills | Status: DC

## 2022-06-26 NOTE — ED Triage Notes (Signed)
Pt presents to [facility] with co of middle ear infection since 5/11 and was treated with po antibiotic. Then she was seen here on 5/28 and was given Augmentin with no improvement. Pt has been referred to an ENT and has an appointment in July.

## 2022-06-26 NOTE — ED Provider Notes (Signed)
[facility]    CSN: [id number] Arrival date & time: 06/26/22  1811      History   Chief Complaint Chief Complaint  Patient presents with   Ear Fullness    Entered by patient    HPI Courtney Colon is a 27 y.o. female.   HPI Patient evaluated by me on 06/23/2022 please see epic encounter note.  Patient was advised to follow-up with ENT if symptoms worsen at this visit as well as previous visit at emergency department on 511/24 for right otitis media.  Patient reports ED visit on 06/06/2022 prescribed her tramadol that was ineffective for right ear pain.  Patient reports attempting to get in with local ENT with earliest appointment of 07/31/2022.  PMH significant for anemia.  Patient is accompanied by her husband this evening.  Past Medical History:  Diagnosis Date   Anemia    Hx of varicella    Pyelonephritis affecting pregnancy in third trimester     Patient Active Problem List   Diagnosis Date Noted   Obesity (BMI 30.0-34.9) 03/09/2022   Unwanted fertility 03/09/2022    Past Surgical History:  Procedure Laterality Date   CESAREAN SECTION N/A 01/11/2015   Procedure: CESAREAN SECTION;  Surgeon: Philip Aspen, DO;  Location: [facility]S;  Service: Obstetrics;  Laterality: N/A;   DILATION AND EVACUATION N/A 02/18/2022   Procedure: DILATATION AND EVACUATION;  Surgeon: Milas Hock, MD;  Location: [facility] OR;  Service: Gynecology;  Laterality: N/A;   IUD REMOVAL N/A 01/30/2022   Procedure: INTRAUTERINE DEVICE (IUD) REMOVAL;  Surgeon: Reva Bores, MD;  Location: [facility] OR;  Service: Gynecology;  Laterality: N/A;   MOUTH SURGERY  01/26/2009   wisdom teeth ext   OPERATIVE ULTRASOUND N/A 01/30/2022   Procedure: OPERATIVE ULTRASOUND;  Surgeon: Reva Bores, MD;  Location: [facility] OR;  Service: Gynecology;  Laterality: N/A;    OB History     Gravida  6   Para  5   Term  5   Preterm      AB  1   Living  5      SAB  1   IAB      Ectopic      Multiple  0   Live  Births  5            Home Medications    Prior to Admission medications   Medication Sig Start Date End Date Taking? Authorizing Provider  predniSONE (DELTASONE) 20 MG tablet Take 3 tabs PO daily x 5 days. 06/26/22  Yes Trevor Iha, FNP  acetaminophen (TYLENOL) 325 MG tablet Take 650 mg by mouth every 6 (six) hours as needed for moderate pain. Patient not taking: Reported on 02/23/2022    [provider]  amoxicillin (AMOXIL) 875 MG tablet Take 1 tablet (875 mg total) by mouth 2 (two) times daily for 10 days. 06/23/22 07/03/22  Trevor Iha, FNP  fluconazole (DIFLUCAN) 150 MG tablet Take 1 tab p.o. for vaginal candidiasis, may repeat 1 tab p.o. in 3 days if symptoms are not resolved. 06/23/22   Trevor Iha, FNP  ibuprofen (ADVIL) 800 MG tablet Take 1 tablet (800 mg total) by mouth every 8 (eight) hours as needed. Patient taking differently: Take 800 mg by mouth every 8 (eight) hours as needed for mild pain or headache. 02/02/22   Thressa Sheller D, CNM  Norethindrone Acetate-Ethinyl Estrad-FE (LOESTRIN 24 FE) 1-20 MG-MCG(24) tablet Take 1 tablet by mouth daily. 03/09/22   Lesly Dukes,  MD    Family History Family History  Problem Relation Age of Onset   Hypertension Mother     Social History Social History   Tobacco Use   Smoking status: Never   Smokeless tobacco: Never  Vaping Use   Vaping Use: Never used  Substance Use Topics   Alcohol use: No   Drug use: No     Allergies   Patient has no known allergies.   Review of Systems Review of Systems  HENT:  Positive for ear pain.   All other systems reviewed and are negative.    Physical Exam Triage Vital Signs ED Triage Vitals  Enc Vitals Group     BP      Pulse      Resp      Temp      Temp src      SpO2      Weight      Height      Head Circumference      Peak Flow      Pain Score      Pain Loc      Pain Edu?      Excl. in GC?    No data found.  Updated Vital Signs BP 138/86   Pulse  87   Temp 97.6 F (36.4 C)   Resp 19   LMP 06/05/2022 (Exact Date)   SpO2 98%      Physical Exam Vitals and nursing note reviewed.  Constitutional:      Appearance: Normal appearance. She is normal weight. She is ill-appearing.  HENT:     Head: Normocephalic and atraumatic.     Right Ear: External ear normal.     Left Ear: Tympanic membrane, ear canal and external ear normal.     Ears:     Comments: Right TM: Erythematous, red rimmed patient reporting increased pain during exam    Mouth/Throat:     Mouth: Mucous membranes are moist.     Pharynx: Oropharynx is clear.  Eyes:     Extraocular Movements: Extraocular movements intact.     Conjunctiva/sclera: Conjunctivae normal.     Pupils: Pupils are equal, round, and reactive to light.  Cardiovascular:     Rate and Rhythm: Normal rate and regular rhythm.     Pulses: Normal pulses.     Heart sounds: Normal heart sounds.  Pulmonary:     Effort: Pulmonary effort is normal.     Breath sounds: Normal breath sounds. No wheezing, rhonchi or rales.  Musculoskeletal:        General: Normal range of motion.     Cervical back: Normal range of motion and neck supple.  Skin:    General: Skin is warm and dry.  Neurological:     General: No focal deficit present.     Mental Status: She is alert and oriented to person, place, and time. Mental status is at baseline.      UC Treatments / Results  Labs (all labs ordered are listed, but only abnormal results are displayed) Labs Reviewed - No data to display  EKG   Radiology No results found.  Procedures Procedures (including critical care time)  Medications Ordered in UC Medications - No data to display  Initial Impression / Assessment and Plan / UC Course  I have reviewed the triage vital signs and the nursing notes.  Pertinent labs & imaging results that were available during my care of the patient were reviewed by me and  considered in my medical decision making (see chart  for details).     MDM: 1.  Acute right otitis media-advised patient to continue amoxicillin 875 mg twice daily as initially prescribed until complete.  Advised if symptoms worsen please go to [facility] for possible ENT consult due to ongoing right ear pain. 2.  Acute otalgia, right-Rx'd prednisone 60 mg daily x 5 days, reports previously prescribed tramadol at ED visit of 06/06/2022 did not help relieve right ear pain.  Discharged home, hemodynamically stable   Final Clinical Impressions(s) / UC Diagnoses   Final diagnoses:  Acute right otitis media  Acute otalgia, right     Discharge Instructions      Advised patient to take medication as directed with food to completion.  Encouraged increase daily water intake to 64 ounces per day.  Advised patient to continue Amoxicillin as initially prescribed.  Advised if right ear pain worsens please go to [facility] ED for possible ENT consult due to ongoing right ear pain.     ED Prescriptions     Medication Sig Dispense Auth. Provider   predniSONE (DELTASONE) 20 MG tablet Take 3 tabs PO daily x 5 days. 15 tablet Trevor Iha, FNP      PDMP not reviewed this encounter.   Trevor Iha, FNP 06/26/22 1857

## 2022-06-26 NOTE — Discharge Instructions (Addendum)
Advised patient to take medication as directed with food to completion.  Encouraged increase daily water intake to 64 ounces per day.  Advised patient to continue Amoxicillin as initially prescribed.  Advised if right ear pain worsens please go to [facility] ED for possible ENT consult due to ongoing right ear pain.

## 2022-06-27 ENCOUNTER — Other Ambulatory Visit: Payer: Self-pay | Admitting: Advanced Practice Midwife

## 2022-08-12 ENCOUNTER — Encounter: Payer: Self-pay | Admitting: Obstetrics and Gynecology

## 2022-08-12 ENCOUNTER — Ambulatory Visit (INDEPENDENT_AMBULATORY_CARE_PROVIDER_SITE_OTHER): Payer: Medicaid Other | Admitting: Obstetrics and Gynecology

## 2022-08-12 VITALS — BP 130/88 | HR 85 | Ht <= 58 in | Wt 155.0 lb

## 2022-08-12 DIAGNOSIS — Z3009 Encounter for other general counseling and advice on contraception: Secondary | ICD-10-CM | POA: Diagnosis not present

## 2022-08-12 NOTE — Progress Notes (Signed)
   GYNECOLOGY OFFICE VISIT NOTE  History:   Courtney Colon is a 27 y.o. [id number] here today for discussed for salpingectomy. She does not want any more children. She had an unplanned pregnancy with copper IUD. She is currently on OCPs until she can have tubal but she doesn't like them.    She denies any abnormal vaginal discharge, bleeding, pelvic pain or other concerns.     Past Medical History:  Diagnosis Date   Anemia    Hx of varicella    Pyelonephritis affecting pregnancy in third trimester     Past Surgical History:  Procedure Laterality Date   CESAREAN SECTION N/A 01/11/2015   Procedure: CESAREAN SECTION;  Surgeon: Philip Aspen, DO;  Location: [facility];  Service: Obstetrics;  Laterality: N/A;   DILATION AND EVACUATION N/A 02/18/2022   Procedure: DILATATION AND EVACUATION;  Surgeon: Milas Hock, MD;  Location: [facility] OR;  Service: Gynecology;  Laterality: N/A;   IUD REMOVAL N/A 01/30/2022   Procedure: INTRAUTERINE DEVICE (IUD) REMOVAL;  Surgeon: Reva Bores, MD;  Location: [facility] OR;  Service: Gynecology;  Laterality: N/A;   MOUTH SURGERY  01/26/2009   wisdom teeth ext   OPERATIVE ULTRASOUND N/A 01/30/2022   Procedure: OPERATIVE ULTRASOUND;  Surgeon: Reva Bores, MD;  Location: [facility] OR;  Service: Gynecology;  Laterality: N/A;    The following portions of the patient's history were reviewed and updated as appropriate: allergies, current medications, past family history, past medical history, past social history, past surgical history and problem list.   Review of Systems:  Pertinent items noted in HPI and remainder of comprehensive ROS otherwise negative.  Physical Exam:  BP 130/88   Pulse 85   Ht 4\' 10"  (1.473 m)   Wt 155 lb (70.3 kg)   BMI 32.40 kg/m  CONSTITUTIONAL: Well-developed, well-nourished female in no acute distress.  HEENT:  Normocephalic, atraumatic. External right and left ear normal. No scleral icterus.  NECK: Normal range of motion, supple, no masses noted  on observation SKIN: No rash noted. Not diaphoretic. No erythema. No pallor. MUSCULOSKELETAL: Normal range of motion. No edema noted. NEUROLOGIC: Alert and oriented to person, place, and time. Normal muscle tone coordination. No cranial nerve deficit noted. PSYCHIATRIC: Normal mood and affect. Normal behavior. Normal judgment and thought content.  Labs and Imaging No results found for this or any previous visit (from the past 168 hour(s)). No results found.  Assessment and Plan:   1. Unwanted fertility - She desires permanent sterilization. Discussed alternatives including LARC options and vasectomy. She declines these options.  - Discussed surgery of salpingectomy vs tubal ligation. She would like to do a salpingectomy.  - Risks of surgery include but are not limited to: bleeding, infection, injury to surrounding organs/tissues (i.e. bowel/bladder/ureters), need for additional procedures, wound complications, hospital re-admission, and conversion to open surgery, VTE - Reviewed restrictions and recovery following surgery - Message sent to surgery scheduler.    Routine preventative health maintenance measures emphasized. Please refer to After Visit Summary for other counseling recommendations.   Return if symptoms worsen or fail to improve.  Milas Hock, MD, FACOG Obstetrician & Gynecologist, [facility] for [facility], [facility]

## 2022-08-25 ENCOUNTER — Telehealth: Payer: Self-pay

## 2022-08-25 ENCOUNTER — Encounter: Payer: Self-pay | Admitting: Obstetrics and Gynecology

## 2022-08-25 NOTE — Telephone Encounter (Signed)
Called patient to schedule her procedure with Dr. Para March. Patient chose 09/29/22 at 2pm at [facility]. Patient is aware she must arrive by 12pm. Provided surgery details and pre-op instructions via phone.

## 2022-09-07 ENCOUNTER — Ambulatory Visit: Payer: Medicaid Other | Admitting: Family Medicine

## 2022-09-11 ENCOUNTER — Encounter: Payer: Self-pay | Admitting: Family Medicine

## 2022-09-11 ENCOUNTER — Ambulatory Visit (INDEPENDENT_AMBULATORY_CARE_PROVIDER_SITE_OTHER): Payer: Medicaid Other | Admitting: Family Medicine

## 2022-09-11 VITALS — BP 125/84 | HR 85 | Temp 98.9°F | Resp 18 | Ht 60.0 in | Wt 152.0 lb

## 2022-09-11 DIAGNOSIS — Z13228 Encounter for screening for other metabolic disorders: Secondary | ICD-10-CM | POA: Insufficient documentation

## 2022-09-11 DIAGNOSIS — Z862 Personal history of diseases of the blood and blood-forming organs and certain disorders involving the immune mechanism: Secondary | ICD-10-CM | POA: Diagnosis not present

## 2022-09-11 DIAGNOSIS — Z7689 Persons encountering health services in other specified circumstances: Secondary | ICD-10-CM | POA: Diagnosis not present

## 2022-09-11 DIAGNOSIS — Q831 Accessory breast: Secondary | ICD-10-CM

## 2022-09-11 HISTORY — DX: Persons encountering health services in other specified circumstances: Z76.89

## 2022-09-11 NOTE — Assessment & Plan Note (Signed)
Denies fatigue. Will get labs at CPE visit.

## 2022-09-11 NOTE — Progress Notes (Signed)
New Patient Office Visit  Subjective    Patient ID: Courtney Colon, female    DOB: Nov 29, 1995  Age: 27 y.o. MRN: [id number]  CC:  Chief Complaint  Patient presents with   Establish Care    Patient is here to  establish care with new PCP    HPI Courtney Colon presents to establish care with this practice. She is new to me.   History reviewed. Medications reviewed.   Lump in armpits bilateral. Occurred with breastfeeding 7 years ago with first born, milk would come out. Hurts when she is on her period now.  Present all the time, painful with menstrual cycle. Will order ultrasound to assess.   Has surgery scheduled for tubal ligation in September.  GYN: manages oral contraceptives. Will plan to do pap smear at CPE visit.  History of anemia. Will get labs at CPE visit. Denies fatigue currently.    Outpatient Encounter Medications as of 09/11/2022  Medication Sig   Norethindrone Acetate-Ethinyl Estrad-FE (LOESTRIN 24 FE) 1-20 MG-MCG(24) tablet Take 1 tablet by mouth daily.   [DISCONTINUED] amoxicillin-clavulanate (AUGMENTIN) 875-125 MG tablet SMARTSIG:1 Tablet(s) By Mouth Every 12 Hours   No facility-administered encounter medications on file as of 09/11/2022.    Past Medical History:  Diagnosis Date   Anemia    Establishing care with new doctor, encounter for 09/11/2022   Hx of varicella    Pyelonephritis affecting pregnancy in third trimester     Past Surgical History:  Procedure Laterality Date   CESAREAN SECTION N/A 01/11/2015   Procedure: CESAREAN SECTION;  Surgeon: Philip Aspen, DO;  Location: [facility]S;  Service: Obstetrics;  Laterality: N/A;   DILATION AND EVACUATION N/A 02/18/2022   Procedure: DILATATION AND EVACUATION;  Surgeon: Milas Hock, MD;  Location: [facility] OR;  Service: Gynecology;  Laterality: N/A;   IUD REMOVAL N/A 01/30/2022   Procedure: INTRAUTERINE DEVICE (IUD) REMOVAL;  Surgeon: Reva Bores, MD;  Location: [facility] OR;  Service: Gynecology;  Laterality: N/A;    MOUTH SURGERY  01/26/2009   wisdom teeth ext   OPERATIVE ULTRASOUND N/A 01/30/2022   Procedure: OPERATIVE ULTRASOUND;  Surgeon: Reva Bores, MD;  Location: [facility] OR;  Service: Gynecology;  Laterality: N/A;    Family History  Problem Relation Age of Onset   Hypertension Mother     Social History   Socioeconomic History   Marital status: Significant Other    Spouse name: Pennie Banter   Number of children: 5   Years of education: Not on file   Highest education level: 12th grade  Occupational History   Not on file  Tobacco Use   Smoking status: Never   Smokeless tobacco: Never  Vaping Use   Vaping status: Never Used  Substance and Sexual Activity   Alcohol use: No   Drug use: No   Sexual activity: Yes    Birth control/protection: Pill, None  Other Topics Concern   Not on file  Social History Narrative   Not on file   Social Determinants of Health   Financial Resource Strain: Low Risk  (09/09/2022)   Overall Financial Resource Strain (CARDIA)    Difficulty of Paying Living Expenses: Not hard at all  Food Insecurity: No Food Insecurity (09/09/2022)   Hunger Vital Sign    Worried About Running Out of Food in the Last Year: Never true    Ran Out of Food in the Last Year: Never true  Transportation Needs: No Transportation Needs (09/09/2022)   PRAPARE - Transportation  Lack of Transportation (Medical): No    Lack of Transportation (Non-Medical): No  Physical Activity: Insufficiently Active (09/09/2022)   Exercise Vital Sign    Days of Exercise per Week: 3 days    Minutes of Exercise per Session: 30 min  Stress: Patient Declined (09/09/2022)   [employer] of Occupational Health - Occupational Stress Questionnaire    Feeling of Stress : Patient declined  Social Connections: Moderately Integrated (09/09/2022)   Social Connection and Isolation Panel [NHANES]    Frequency of Communication with Friends and Family: Twice a week    Frequency of Social Gatherings with  Friends and Family: Once a week    Attends Religious Services: More than 4 times per year    Active Member of [employer] or Organizations: No    Attends Engineer, structural: Not on file    Marital Status: Living with partner  Intimate Partner Violence: Not At Risk (08/24/2022)   Received from [facility]   HITS    Over the last 12 months how often did your partner physically hurt you?: 1    Over the last 12 months how often did your partner insult you or talk down to you?: 1    Over the last 12 months how often did your partner threaten you with physical harm?: 1    Over the last 12 months how often did your partner scream or curse at you?: 1    Review of Systems  Constitutional:  Negative for malaise/fatigue.  Eyes:  Negative for blurred vision and double vision.  Respiratory:  Negative for shortness of breath.   Cardiovascular:  Negative for chest pain.  Gastrointestinal:  Negative for nausea and vomiting.  Neurological:  Positive for headaches (occassioanlly.).  Psychiatric/Behavioral:  Negative for depression and suicidal ideas.         Objective    BP 125/84   Pulse 85   Temp 98.9 F (37.2 C) (Oral)   Resp 18   Ht 5' (1.524 m)   Wt 152 lb (68.9 kg)   LMP 08/15/2022 (Exact Date)   SpO2 98%   BMI 29.69 kg/m   Physical Exam Vitals and nursing note reviewed.  Constitutional:      General: She is not in acute distress.    Appearance: Normal appearance.  Cardiovascular:     Rate and Rhythm: Regular rhythm.     Heart sounds: Normal heart sounds.  Pulmonary:     Effort: Pulmonary effort is normal.     Breath sounds: Normal breath sounds.  Chest:       Comments: Soft tissue under each axilla.  Skin:    General: Skin is warm and dry.     Capillary Refill: Capillary refill takes less than 2 seconds.  Neurological:     General: No focal deficit present.     Mental Status: She is alert. Mental status is at baseline.  Psychiatric:        Mood and Affect:  Mood normal.        Behavior: Behavior normal.        Thought Content: Thought content normal.        Judgment: Judgment normal.        Assessment & Plan:  Establishing care with new doctor, encounter for  History of anemia Assessment & Plan: Denies fatigue. Will get labs at CPE visit.    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 Tissue produces milk with each pregnancy. Painful  with menstrual cycle. Will send for ultrasound for further evaluation.   Orders: -     [place] AXILLA LEFT -     [place] AXILLA RIGHT    Agrees with  plan of care discussed.  Questions answered.   Return in about 1 week (around 09/18/2022) for CPE with labs and pap .   Novella Olive, FNP

## 2022-09-11 NOTE — Assessment & Plan Note (Signed)
Tissue present since first child born. Tissue produces milk with each pregnancy. Painful with menstrual cycle. Will send for ultrasound for further evaluation.

## 2022-09-16 ENCOUNTER — Encounter (HOSPITAL_BASED_OUTPATIENT_CLINIC_OR_DEPARTMENT_OTHER): Payer: Self-pay | Admitting: Obstetrics and Gynecology

## 2022-09-16 NOTE — Progress Notes (Signed)
Spoke w/ via phone for pre-op interview--- Synetta Fail Lab needs dos----   UPT and T&S per surgeon            Lab results------ COVID test -----patient states asymptomatic no test needed Arrive at -------1215 NPO after MN NO Solid Food.  Clear liquids from MN until---1115 Med rec completed Medications to take morning of surgery -----NONE Diabetic medication ----- Patient instructed no nail polish to be worn day of surgery Patient instructed to bring photo id and insurance card day of surgery Patient aware to have Driver (ride ) / caregiver Husband Alfonse Flavors   for 24 hours after surgery  Patient Special Instructions ----- Pre-Op special Instructions ----- Patient verbalized understanding of instructions that were given at this phone interview. Patient denies shortness of breath, chest pain, fever, cough at this phone interview.

## 2022-09-18 ENCOUNTER — Ambulatory Visit (INDEPENDENT_AMBULATORY_CARE_PROVIDER_SITE_OTHER): Payer: Medicaid Other | Admitting: Family Medicine

## 2022-09-18 ENCOUNTER — Encounter: Payer: Self-pay | Admitting: Family Medicine

## 2022-09-18 VITALS — BP 118/88 | HR 89 | Temp 99.2°F | Resp 18 | Ht 60.0 in | Wt 151.0 lb

## 2022-09-18 DIAGNOSIS — Z124 Encounter for screening for malignant neoplasm of cervix: Secondary | ICD-10-CM

## 2022-09-18 DIAGNOSIS — Z13220 Encounter for screening for lipoid disorders: Secondary | ICD-10-CM | POA: Diagnosis not present

## 2022-09-18 DIAGNOSIS — Z8639 Personal history of other endocrine, nutritional and metabolic disease: Secondary | ICD-10-CM | POA: Diagnosis not present

## 2022-09-18 DIAGNOSIS — Z0000 Encounter for general adult medical examination without abnormal findings: Secondary | ICD-10-CM | POA: Diagnosis not present

## 2022-09-18 DIAGNOSIS — Z136 Encounter for screening for cardiovascular disorders: Secondary | ICD-10-CM

## 2022-09-18 DIAGNOSIS — Z6829 Body mass index (BMI) 29.0-29.9, adult: Secondary | ICD-10-CM | POA: Insufficient documentation

## 2022-09-18 DIAGNOSIS — Z13228 Encounter for screening for other metabolic disorders: Secondary | ICD-10-CM

## 2022-09-18 DIAGNOSIS — Z1159 Encounter for screening for other viral diseases: Secondary | ICD-10-CM

## 2022-09-18 NOTE — Progress Notes (Signed)
Complete physical exam  Patient: Courtney Colon   DOB: August 09, 1995   26 y.o. Female  MRN: [id number]  Subjective:    Chief Complaint  Patient presents with   Annual Exam    Courtney Colon is a 27 y.o. female who presents today for a complete physical exam. She reports consuming a general diet. The patient does not participate in regular exercise at present. She generally feels fairly well. She reports sleeping fairly well. She does not have additional problems to discuss today.    Most recent fall risk assessment:    09/11/2022   10:39 AM  Fall Risk   Falls in the past year? 0  Number falls in past yr: 0  Injury with Fall? 0  Risk for fall due to : No Fall Risks  Follow up Falls evaluation completed     Most recent depression screenings:    09/11/2022   10:39 AM  PHQ 2/9 Scores  PHQ - 2 Score 0  PHQ- 9 Score 2    Vision:Within last year and Dental: No current dental problems and Receives regular dental care    Patient Care Team: Novella Olive, FNP as PCP - General (Family Medicine)   Outpatient Medications Prior to Visit  Medication Sig   Norethindrone Acetate-Ethinyl Estrad-FE (LOESTRIN 24 FE) 1-20 MG-MCG(24) tablet Take 1 tablet by mouth daily.   No facility-administered medications prior to visit.    ROS        Objective:     BP 118/88 (BP Location: Left Arm, Patient Position: Sitting, Cuff Size: Normal)   Pulse 89   Temp 99.2 F (37.3 C)   Resp 18   Ht 5' (1.524 m)   Wt 151 lb (68.5 kg)   LMP 08/17/2022   SpO2 99%   BMI 29.49 kg/m  BP Readings from Last 3 Encounters:  09/18/22 118/88  09/11/22 125/84  08/12/22 130/88      Physical Exam Vitals and nursing note reviewed. Exam conducted with a chaperone present.  Constitutional:      General: She is not in acute distress.    Appearance: Normal appearance.  HENT:     Right Ear: Tympanic membrane normal.     Left Ear: Tympanic membrane normal.     Nose: Nose normal.     Mouth/Throat:      Mouth: Mucous membranes are moist.     Pharynx: Oropharynx is clear.  Eyes:     Extraocular Movements: Extraocular movements intact.  Neck:     Thyroid: No thyroid tenderness.  Cardiovascular:     Rate and Rhythm: Normal rate and regular rhythm.     Pulses:          Radial pulses are 2+ on the right side and 2+ on the left side.     Heart sounds: Normal heart sounds, S1 normal and S2 normal.  Pulmonary:     Effort: Pulmonary effort is normal.     Breath sounds: Normal breath sounds.  Abdominal:     General: Bowel sounds are normal.     Palpations: Abdomen is soft.     Tenderness: There is no abdominal tenderness.  Genitourinary:    General: Normal vulva.     Exam position: Lithotomy position.     Vagina: Normal.     Cervix: Normal.     Adnexa: Right adnexa normal and left adnexa normal.  Musculoskeletal:        General: Normal range of motion.     Cervical  back: Normal range of motion.     Right lower leg: No edema.     Left lower leg: No edema.  Lymphadenopathy:     Cervical:     Right cervical: No superficial cervical adenopathy.    Left cervical: No superficial cervical adenopathy.  Skin:    General: Skin is warm and dry.  Neurological:     General: No focal deficit present.     Mental Status: She is alert. Mental status is at baseline.  Psychiatric:        Mood and Affect: Mood normal.        Behavior: Behavior normal.        Thought Content: Thought content normal.        Judgment: Judgment normal.      No results found for any visits on 09/18/22.     Assessment & Plan:    Routine Health Maintenance and Physical Exam  Immunization History  Administered Date(s) Administered   Influenza, Seasonal, Injecte, Preservative Fre 01/26/2014, 12/11/2015   Influenza,inj,Quad PF,6+ Mos 10/26/2018, 11/27/2019   Tdap 10/22/2014, 06/03/2017, 10/13/2018, 11/13/2019    Health Maintenance  Topic Date Due   Hepatitis C Screening  Never done   PAP-Cervical Cytology  Screening  Never done   PAP SMEAR-Modifier  Never done   INFLUENZA VACCINE  10/05/2022 (Originally 08/27/2022)   COVID-19 Vaccine (1) 09/13/2023 (Originally 10/03/1999)   DTaP/Tdap/Td (5 - Td or Tdap) 11/12/2029   HIV Screening  Completed   HPV VACCINES  Aged Out    Discussed health benefits of physical activity, and encouraged her to engage in regular exercise appropriate for her age and condition.  Annual physical exam -     CBC with Differential/Platelet -     Comprehensive metabolic panel  Encounter for lipid screening for cardiovascular disease -     Lipid panel  History of iron deficiency -     Iron, TIBC and Ferritin Panel  Screening for cervical cancer -     Pap LB (liquid-based)  BMI 29.0-29.9,adult -     Hemoglobin A1c -     TSH  Encounter for screening for metabolic disorder -     Hemoglobin A1c -     TSH  Need for hepatitis C screening test -     Hepatitis C antibody      Routine labs ordered.  HCM reviewed/discussed. Anticipatory guidance regarding healthy weight, lifestyle and choices given. Recommend healthy diet.  Recommend approximately 150 minutes/week of moderate intensity exercise. Resistance training is good for building muscles and for bone health. Muscle mass helps to increase our metabolism and to burn more calories at rest.  Limit alcohol consumption: no more than one drink per day for women and 2 drinks per day for me. Recommend regular dental and vision exams. Always use seatbelt/lap and shoulder restraints. Recommend using smoke alarms and checking batteries at least twice a year. Recommend using sunscreen when outside.  Please know that I am here to help you with all of your health care goals and am happy to work with you to find a solution that works best for you.  The greatest advice I have received with any changes in life are to take it one step at a time, that even means if all you can focus on is the next 60 seconds, then do that and  celebrate your victories.  With any changes in life, you will have set backs, and that is OK. The important thing to remember  is, if you have a set back, it is not a failure, it is an opportunity to try again! Agrees with plan of care discussed.  Questions answered.      Return in about 1 year (around 09/18/2023) for CPE with labs.     Novella Olive, FNP

## 2022-09-19 LAB — COMPREHENSIVE METABOLIC PANEL
ALT: 22 IU/L (ref 0–32)
AST: 17 IU/L (ref 0–40)
Albumin: 4.2 g/dL (ref 4.0–5.0)
Alkaline Phosphatase: 68 IU/L (ref 44–121)
BUN/Creatinine Ratio: 13 (ref 9–23)
BUN: 8 mg/dL (ref 6–20)
Bilirubin Total: 0.6 mg/dL (ref 0.0–1.2)
CO2: 24 mmol/L (ref 20–29)
Calcium: 9.1 mg/dL (ref 8.7–10.2)
Chloride: 103 mmol/L (ref 96–106)
Creatinine, Ser: 0.63 mg/dL (ref 0.57–1.00)
Globulin, Total: 3.6 g/dL (ref 1.5–4.5)
Glucose: 87 mg/dL (ref 70–99)
Potassium: 3.7 mmol/L (ref 3.5–5.2)
Sodium: 140 mmol/L (ref 134–144)
Total Protein: 7.8 g/dL (ref 6.0–8.5)
eGFR: 125 mL/min/{1.73_m2} (ref >59)

## 2022-09-19 LAB — IRON,TIBC AND FERRITIN PANEL
Ferritin: 59 ng/mL (ref 15–150)
Iron Saturation: 16 % (ref 15–55)
Iron: 49 ug/dL (ref 27–159)
Total Iron Binding Capacity: 315 ug/dL (ref 250–450)
UIBC: 266 ug/dL (ref 131–425)

## 2022-09-19 LAB — CBC WITH DIFFERENTIAL/PLATELET
Basophils Absolute: 0 10*3/uL (ref 0.0–0.2)
Basos: 1 %
EOS (ABSOLUTE): 0.5 10*3/uL — ABNORMAL HIGH (ref 0.0–0.4)
Eos: 14 %
Hematocrit: 38.1 % (ref 34.0–46.6)
Hemoglobin: 12.8 g/dL (ref 11.1–15.9)
Immature Grans (Abs): 0 10*3/uL (ref 0.0–0.1)
Immature Granulocytes: 0 %
Lymphocytes Absolute: 1 10*3/uL (ref 0.7–3.1)
Lymphs: 26 %
MCH: 29.6 pg (ref 26.6–33.0)
MCHC: 33.6 g/dL (ref 31.5–35.7)
MCV: 88 fL (ref 79–97)
Monocytes Absolute: 0.4 10*3/uL (ref 0.1–0.9)
Monocytes: 11 %
Neutrophils Absolute: 1.9 10*3/uL (ref 1.4–7.0)
Neutrophils: 48 %
Platelets: 194 10*3/uL (ref 150–450)
RBC: 4.33 x10E6/uL (ref 3.77–5.28)
RDW: 11.3 % — ABNORMAL LOW (ref 11.7–15.4)
WBC: 3.9 10*3/uL (ref 3.4–10.8)

## 2022-09-19 LAB — HEMOGLOBIN A1C
Est. average glucose Bld gHb Est-mCnc: 97 mg/dL
Hgb A1c MFr Bld: 5 % (ref 4.8–5.6)

## 2022-09-19 LAB — LIPID PANEL
Chol/HDL Ratio: 3 ratio (ref 0.0–4.4)
Cholesterol, Total: 163 mg/dL (ref 100–199)
HDL: 55 mg/dL (ref >39)
LDL Chol Calc (NIH): 95 mg/dL (ref 0–99)
Triglycerides: 64 mg/dL (ref 0–149)
VLDL Cholesterol Cal: 13 mg/dL (ref 5–40)

## 2022-09-19 LAB — HEPATITIS C ANTIBODY: Hep C Virus Ab: NONREACTIVE

## 2022-09-19 LAB — TSH: TSH: 1.34 u[IU]/mL (ref 0.450–4.500)

## 2022-09-22 LAB — PAP LB (LIQUID-BASED): PAP Smear Comment: 0

## 2022-09-29 ENCOUNTER — Ambulatory Visit (HOSPITAL_BASED_OUTPATIENT_CLINIC_OR_DEPARTMENT_OTHER): Payer: Medicaid Other | Admitting: Anesthesiology

## 2022-09-29 ENCOUNTER — Encounter (HOSPITAL_BASED_OUTPATIENT_CLINIC_OR_DEPARTMENT_OTHER): Payer: Self-pay | Admitting: Obstetrics and Gynecology

## 2022-09-29 ENCOUNTER — Encounter (HOSPITAL_BASED_OUTPATIENT_CLINIC_OR_DEPARTMENT_OTHER)
Admission: RE | Disposition: A | Discharge status: Home / Self Care | Payer: Self-pay | Source: Home / Self Care | Attending: Obstetrics and Gynecology

## 2022-09-29 ENCOUNTER — Other Ambulatory Visit: Payer: Self-pay

## 2022-09-29 ENCOUNTER — Ambulatory Visit (HOSPITAL_BASED_OUTPATIENT_CLINIC_OR_DEPARTMENT_OTHER)
Admission: RE | Admit: 2022-09-29 | Discharge: 2022-09-29 | Disposition: A | Discharge status: Home / Self Care | Payer: Medicaid Other | Attending: Obstetrics and Gynecology | Admitting: Obstetrics and Gynecology

## 2022-09-29 DIAGNOSIS — Z302 Encounter for sterilization: Secondary | ICD-10-CM | POA: Diagnosis not present

## 2022-09-29 DIAGNOSIS — Z3009 Encounter for other general counseling and advice on contraception: Secondary | ICD-10-CM | POA: Diagnosis present

## 2022-09-29 DIAGNOSIS — Z793 Long term (current) use of hormonal contraceptives: Secondary | ICD-10-CM | POA: Diagnosis not present

## 2022-09-29 HISTORY — PX: LAPAROSCOPIC BILATERAL SALPINGECTOMY: SHX5889

## 2022-09-29 LAB — TYPE AND SCREEN
ABO/RH(D): O POS
Antibody Screen: NEGATIVE

## 2022-09-29 LAB — POCT PREGNANCY, URINE: Preg Test, Ur: NEGATIVE

## 2022-09-29 SURGERY — SALPINGECTOMY, BILATERAL, LAPAROSCOPIC
Anesthesia: General | Site: Abdomen | Laterality: Bilateral

## 2022-09-29 MED ORDER — LACTATED RINGERS IV SOLN: INTRAVENOUS | Status: DC

## 2022-09-29 MED ORDER — FENTANYL CITRATE (PF) 100 MCG/2ML IJ SOLN
25.0000 ug | INTRAMUSCULAR | Status: DC | PRN
  Administered 2022-09-29 (×2): 25 ug via INTRAVENOUS

## 2022-09-29 MED ORDER — 0.9 % SODIUM CHLORIDE (POUR BTL) OPTIME
TOPICAL | Status: DC | PRN
Start: 2022-09-29 — End: 2022-09-29
  Administered 2022-09-29: 500 mL

## 2022-09-29 MED ORDER — ACETAMINOPHEN 500 MG PO TABS
1000.0000 mg | ORAL_TABLET | ORAL | Status: AC
  Administered 2022-09-29: 1000 mg via ORAL

## 2022-09-29 MED ORDER — ACETAMINOPHEN 500 MG PO TABS
ORAL_TABLET | ORAL | Status: AC
  Filled 2022-09-29: qty 2

## 2022-09-29 MED ORDER — LIDOCAINE HCL (PF) 2 % IJ SOLN
INTRAMUSCULAR | Status: AC
  Filled 2022-09-29: qty 10

## 2022-09-29 MED ORDER — FENTANYL CITRATE (PF) 100 MCG/2ML IJ SOLN
INTRAMUSCULAR | Status: AC
  Filled 2022-09-29: qty 2

## 2022-09-29 MED ORDER — SUGAMMADEX SODIUM 200 MG/2ML IV SOLN
INTRAVENOUS | Status: DC | PRN
  Administered 2022-09-29: 200 mg via INTRAVENOUS

## 2022-09-29 MED ORDER — ONDANSETRON HCL 4 MG/2ML IJ SOLN
INTRAMUSCULAR | Status: DC | PRN
  Administered 2022-09-29: 4 mg via INTRAVENOUS

## 2022-09-29 MED ORDER — ONDANSETRON HCL 4 MG/2ML IJ SOLN
INTRAMUSCULAR | Status: AC
  Filled 2022-09-29: qty 2

## 2022-09-29 MED ORDER — PROPOFOL 10 MG/ML IV BOLUS
INTRAVENOUS | Status: AC
  Filled 2022-09-29: qty 20

## 2022-09-29 MED ORDER — OXYCODONE HCL 5 MG PO TABS
5.0000 mg | ORAL_TABLET | Freq: Once | ORAL | Status: AC | PRN
  Administered 2022-09-29: 5 mg via ORAL

## 2022-09-29 MED ORDER — KETOROLAC TROMETHAMINE 30 MG/ML IJ SOLN
INTRAMUSCULAR | Status: DC | PRN
  Administered 2022-09-29: 30 mg via INTRAVENOUS

## 2022-09-29 MED ORDER — ONDANSETRON HCL 4 MG/2ML IJ SOLN: 4.0000 mg | Freq: Once | INTRAMUSCULAR | Status: DC | PRN

## 2022-09-29 MED ORDER — DEXAMETHASONE SODIUM PHOSPHATE 10 MG/ML IJ SOLN
INTRAMUSCULAR | Status: AC
  Filled 2022-09-29: qty 1

## 2022-09-29 MED ORDER — ROCURONIUM BROMIDE 10 MG/ML (PF) SYRINGE
PREFILLED_SYRINGE | INTRAVENOUS | Status: AC
  Filled 2022-09-29: qty 10

## 2022-09-29 MED ORDER — OXYCODONE HCL 5 MG/5ML PO SOLN: 5.0000 mg | Freq: Once | ORAL | Status: AC | PRN

## 2022-09-29 MED ORDER — LIDOCAINE 2% (20 MG/ML) 5 ML SYRINGE
INTRAMUSCULAR | Status: DC | PRN
  Administered 2022-09-29: 100 mg via INTRAVENOUS

## 2022-09-29 MED ORDER — FENTANYL CITRATE (PF) 100 MCG/2ML IJ SOLN
INTRAMUSCULAR | Status: DC | PRN
  Administered 2022-09-29: 50 ug via INTRAVENOUS
  Administered 2022-09-29: 100 ug via INTRAVENOUS

## 2022-09-29 MED ORDER — PROPOFOL 10 MG/ML IV BOLUS
INTRAVENOUS | Status: DC | PRN
  Administered 2022-09-29: 180 mg via INTRAVENOUS

## 2022-09-29 MED ORDER — DEXMEDETOMIDINE HCL IN NACL 80 MCG/20ML IV SOLN
INTRAVENOUS | Status: DC | PRN
  Administered 2022-09-29: 8 ug via INTRAVENOUS

## 2022-09-29 MED ORDER — OXYCODONE HCL 5 MG PO TABS
5.0000 mg | ORAL_TABLET | ORAL | 0 refills | Status: DC | PRN
Start: 2022-09-29 — End: 2023-01-05

## 2022-09-29 MED ORDER — MIDAZOLAM HCL 5 MG/5ML IJ SOLN
INTRAMUSCULAR | Status: DC | PRN
  Administered 2022-09-29: 2 mg via INTRAVENOUS

## 2022-09-29 MED ORDER — BUPIVACAINE HCL (PF) 0.25 % IJ SOLN
INTRAMUSCULAR | Status: DC | PRN
  Administered 2022-09-29: 6 mL

## 2022-09-29 MED ORDER — IBUPROFEN 800 MG PO TABS: 800.0000 mg | ORAL_TABLET | Freq: Three times a day (TID) | ORAL | 0 refills | Status: DC | PRN

## 2022-09-29 MED ORDER — MIDAZOLAM HCL 2 MG/2ML IJ SOLN
INTRAMUSCULAR | Status: AC
  Filled 2022-09-29: qty 2

## 2022-09-29 MED ORDER — ACETAMINOPHEN 10 MG/ML IV SOLN: 1000.0000 mg | Freq: Once | INTRAVENOUS | Status: DC | PRN

## 2022-09-29 MED ORDER — DEXAMETHASONE SODIUM PHOSPHATE 10 MG/ML IJ SOLN
INTRAMUSCULAR | Status: DC | PRN
  Administered 2022-09-29: 10 mg via INTRAVENOUS

## 2022-09-29 MED ORDER — OXYCODONE HCL 5 MG PO TABS
ORAL_TABLET | ORAL | Status: AC
  Filled 2022-09-29: qty 1

## 2022-09-29 MED ORDER — KETOROLAC TROMETHAMINE 30 MG/ML IJ SOLN
INTRAMUSCULAR | Status: AC
  Filled 2022-09-29: qty 3

## 2022-09-29 MED ORDER — ROCURONIUM BROMIDE 10 MG/ML (PF) SYRINGE
PREFILLED_SYRINGE | INTRAVENOUS | Status: DC | PRN
  Administered 2022-09-29: 50 mg via INTRAVENOUS

## 2022-09-29 MED ORDER — POVIDONE-IODINE 10 % EX SWAB: 2.0000 | Freq: Once | CUTANEOUS | Status: DC

## 2022-09-29 SURGICAL SUPPLY — 25 items
ADH SKN CLS APL DERMABOND .7 (GAUZE/BANDAGES/DRESSINGS) ×1
APL PRP STRL LF DISP 70% ISPRP (MISCELLANEOUS) ×1
CHLORAPREP W/TINT 26 (MISCELLANEOUS) ×1 IMPLANT
COVER MAYO STAND STRL (DRAPES) ×1 IMPLANT
DERMABOND ADVANCED .7 DNX12 (GAUZE/BANDAGES/DRESSINGS) ×1 IMPLANT
DRAPE LAPAROSCOPIC ABDOMINAL (DRAPES) ×1 IMPLANT
GLOVE BIO SURGEON STRL SZ 6 (GLOVE) ×1 IMPLANT
GLOVE BIOGEL PI IND STRL 6 (GLOVE) IMPLANT
GOWN STRL REUS W/TWL LRG LVL3 (GOWN DISPOSABLE) ×1 IMPLANT
KIT PINK PAD W/HEAD ARE REST (MISCELLANEOUS) ×1
KIT PINK PAD W/HEAD ARM REST (MISCELLANEOUS) ×1 IMPLANT
KIT TURNOVER CYSTO (KITS) ×1 IMPLANT
LIGASURE VESSEL 5MM BLUNT TIP (ELECTROSURGICAL) IMPLANT
NDL INSUFFLATION 14GA 120MM (NEEDLE) ×1 IMPLANT
NEEDLE INSUFFLATION 14GA 120MM (NEEDLE) ×1 IMPLANT
PACK LAPAROSCOPY BASIN (CUSTOM PROCEDURE TRAY) ×1 IMPLANT
SET TUBE SMOKE EVAC HIGH FLOW (TUBING) ×1 IMPLANT
SLEEVE SCD COMPRESS KNEE MED (STOCKING) ×1 IMPLANT
SOL PREP POV-IOD 4OZ 10% (MISCELLANEOUS) IMPLANT
SPIKE FLUID TRANSFER (MISCELLANEOUS) IMPLANT
SUT MNCRL AB 4-0 PS2 18 (SUTURE) ×1 IMPLANT
TOWEL OR 17X24 6PK STRL BLUE (TOWEL DISPOSABLE) ×1 IMPLANT
TROCAR KII 8X100ML NONTHREADED (TROCAR) ×1 IMPLANT
TROCAR Z-THREAD FIOS 5X100MM (TROCAR) ×1 IMPLANT
WARMER LAPAROSCOPE (MISCELLANEOUS) ×1 IMPLANT

## 2022-09-29 NOTE — Anesthesia Procedure Notes (Signed)
Procedure Name: Intubation Date/Time: 09/29/2022 1:24 PM  Performed by: Bishop Limbo, CRNAPre-anesthesia Checklist: Patient identified, Emergency Drugs available, Suction available and Patient being monitored Patient Re-evaluated:Patient Re-evaluated prior to induction Oxygen Delivery Method: Circle System Utilized Preoxygenation: Pre-oxygenation with 100% oxygen Induction Type: IV induction Ventilation: Mask ventilation without difficulty Laryngoscope Size: Mac and 3 Grade View: Grade I Tube type: Oral Tube size: 7.0 mm Number of attempts: 1 Airway Equipment and Method: Stylet Placement Confirmation: ETT inserted through vocal cords under direct vision, positive ETCO2 and breath sounds checked- equal and bilateral Secured at: 22 cm Tube secured with: Tape Dental Injury: Teeth and Oropharynx as per pre-operative assessment

## 2022-09-29 NOTE — Transfer of Care (Signed)
Immediate Anesthesia Transfer of Care Note  Patient: Courtney Colon  Procedure(s) Performed: LAPAROSCOPIC BILATERAL SALPINGECTOMY (Bilateral: Abdomen)  Patient Location: PACU  Anesthesia Type:General  Level of Consciousness: drowsy, patient cooperative, and responds to stimulation  Airway & Oxygen Therapy: Pt spontaneously breathing  Post-op Assessment: Report given to RN and Post -op Vital signs reviewed and stable  Post vital signs: Reviewed and stable  Last Vitals:  Vitals Value Taken Time  BP 103/60 09/29/22 1413  Temp    Pulse 103 09/29/22 1414  Resp 15 09/29/22 1414  SpO2 97 % 09/29/22 1414  Vitals shown include unfiled device data.  Last Pain:  Vitals:   09/29/22 1245  TempSrc: Oral         Complications: No notable events documented.

## 2022-09-29 NOTE — Anesthesia Postprocedure Evaluation (Signed)
Anesthesia Post Note  Patient: Erleen Lockmiller  Procedure(s) Performed: LAPAROSCOPIC BILATERAL SALPINGECTOMY (Bilateral: Abdomen)     Patient location during evaluation: PACU Anesthesia Type: General Level of consciousness: awake and alert Pain management: pain level controlled Vital Signs Assessment: post-procedure vital signs reviewed and stable Respiratory status: spontaneous breathing, nonlabored ventilation, respiratory function stable and patient connected to nasal cannula oxygen Cardiovascular status: blood pressure returned to baseline and stable Postop Assessment: no apparent nausea or vomiting Anesthetic complications: no   No notable events documented.  Last Vitals:  Vitals:   09/29/22 1414 09/29/22 1415  BP: 103/60 108/73  Pulse: (!) 105 (!) 103  Resp: 15 15  Temp: (!) 36.4 C   SpO2: 97% 97%    Last Pain:  Vitals:   09/29/22 1414  TempSrc:   PainSc: Asleep                 Mariann Barter

## 2022-09-29 NOTE — Discharge Instructions (Signed)

## 2022-09-29 NOTE — Anesthesia Preprocedure Evaluation (Signed)
Anesthesia Evaluation  Patient identified by MRN, date of birth, ID band Patient awake    Reviewed: Allergy & Precautions, NPO status , Patient's Chart, lab work & pertinent test results, reviewed documented beta blocker date and time   History of Anesthesia Complications Negative for: history of anesthetic complications  Airway Mallampati: II  TM Distance: >3 FB Neck ROM: Full    Dental no notable dental hx.    Pulmonary neg pulmonary ROS   breath sounds clear to auscultation       Cardiovascular (-) angina (-) CAD, (-) Past MI, (-) CHF, (-) Orthopnea and (-) DVT (-) Valvular Problems/Murmurs Rhythm:Regular Rate:Normal     Neuro/Psych negative neurological ROS  negative psych ROS   GI/Hepatic ,neg GERD  ,,(+) neg Cirrhosis        Endo/Other  neg diabetes    Renal/GU Renal disease     Musculoskeletal   Abdominal   Peds  Hematology  (+) Blood dyscrasia, anemia   Anesthesia Other Findings   Reproductive/Obstetrics                              Anesthesia Physical Anesthesia Plan  ASA: 2  Anesthesia Plan: General   Post-op Pain Management:    Induction: Intravenous  PONV Risk Score and Plan: 2 and Ondansetron and Dexamethasone  Airway Management Planned: LMA  Additional Equipment:   Intra-op Plan:   Post-operative Plan: Extubation in OR  Informed Consent: I have reviewed the patients History and Physical, chart, labs and discussed the procedure including the risks, benefits and alternatives for the proposed anesthesia with the patient or authorized representative who has indicated his/her understanding and acceptance.     Dental advisory given  Plan Discussed with: CRNA  Anesthesia Plan Comments:          Anesthesia Quick Evaluation

## 2022-09-29 NOTE — Op Note (Signed)
Alver Sorrow PROCEDURE DATE: 09/29/2022   PREOPERATIVE DIAGNOSIS:  Undesired fertility  POSTOPERATIVE DIAGNOSIS:  Undesired fertility  PROCEDURE:  Laparoscopic Bilateral Salpingectomy   SURGEON:  Dr. Milas Hock  ASSISTANT:  None  ANESTHESIA:  General endotracheal, 0.25% marcaine  COMPLICATIONS:  None immediate.  ESTIMATED BLOOD LOSS:  5 ml.  FLUIDS: 700 ml LR.  URINE OUTPUT:  None - the patient voided just prior to the procedure  INDICATIONS: 27 y.o. [id number] with undesired fertility, desires permanent sterilization.  Other forms of contraception were discussed with patient and emphasized alternatives of vasectomy, IUDs and Nexplanon as they have equivalent contraceptive efficacy; she declines all other modalities.  Risks of procedure discussed with patient including permanence of method, risk of regret, bleeding, infection, injury to surrounding organs and need for additional procedures including laparotomy.  Failure risk less than 0.5% with increased risk of ectopic gestation if pregnancy occurs was also discussed with patient.  Written informed consent was obtained.    FINDINGS:  Normal uterus, fallopian tubes, and ovaries. Normal superficial survey.   TECHNIQUE:  The patient was taken to the operating room where general anesthesia was obtained without difficulty.  She was then placed in the dorsal supine position and prepared and draped in sterile fashion.  An adequate time out was performed.   A skin incision was made with the 11 blade scalpel in the umbilicus. I entered her abdominal cavity with a veress needle using closed technique. Opening pressure was 2.    Pneumoperitoneum achieved to a pressure of 15.  The 5 mm optiview port was inserted into the abdomen under direct visualization. Below the point of entry and the pelvis was inspected with no evidence of injury.   The scalpel was then used to make one 5mm incsion in the LLQ and one 8 mm incision suprapubically. Optiview  ports were inserted. The ureters were identified bilaterally. The fallopian tubes were cauterized, cut, and detached from their surrounding pelvic structures with the Ligasure. No bleeding was noted. The specimens were removed from the 8mm port. The pelvis was inspected and was hemostatic. All ports were then withdrawn and the gas drained from abdomen. They were then closed in a subcuticular fashion with 4-0 monocryl and dermabond was placed over this.   The patient will be discharged to home as per PACU criteria.  Routine postoperative instructions given.    Milas Hock, MD Attending Obstetrician & Gynecologist, [facility] for [facility], [facility]

## 2022-09-29 NOTE — H&P (Signed)
Faculty Practice Obstetrics and Gynecology Attending History and Physical  Courtney Colon is a 27 y.o. [id number] who presented today for L/S salpingectomy. She conceived with a copper IUD in and does not want any more children.   She is currently using OCPs for birth control. She does not wish to continue them.     Past Medical History:  Diagnosis Date   Anemia    Establishing care with new doctor, encounter for 09/11/2022   Hx of varicella    Pyelonephritis affecting pregnancy in third trimester    Past Surgical History:  Procedure Laterality Date   CESAREAN SECTION N/A 01/11/2015   Procedure: CESAREAN SECTION;  Surgeon: Philip Aspen, DO;  Location: [facility] ORS;  Service: Obstetrics;  Laterality: N/A;   DILATION AND EVACUATION N/A 02/18/2022   Procedure: DILATATION AND EVACUATION;  Surgeon: Milas Hock, MD;  Location: [facility] OR;  Service: Gynecology;  Laterality: N/A;   IUD REMOVAL N/A 01/30/2022   Procedure: INTRAUTERINE DEVICE (IUD) REMOVAL;  Surgeon: Reva Bores, MD;  Location: [facility] OR;  Service: Gynecology;  Laterality: N/A;   MOUTH SURGERY  01/26/2009   wisdom teeth ext   OPERATIVE ULTRASOUND N/A 01/30/2022   Procedure: OPERATIVE ULTRASOUND;  Surgeon: Reva Bores, MD;  Location: [facility] OR;  Service: Gynecology;  Laterality: N/A;   OB History  Gravida Para Term Preterm AB Living  6 5 5   1 5   SAB IAB Ectopic Multiple Live Births  1     0 5    # Outcome Date GA Lbr Len/2nd Weight Sex Type Anes PTL Lv  6 SAB 02/18/22 [redacted]w[redacted]d         5 Term 01/21/20 [redacted]w[redacted]d 04:43 / 00:08 3399 g M VBAC EPI  LIV  4 Term 12/05/18 [redacted]w[redacted]d 05:12 / 00:02 3080 g F VBAC EPI  LIV  3 Term 07/27/17 [redacted]w[redacted]d 04:20 / 00:32 4000 g F Vag-Spont EPI  LIV  2 Term 02/25/16 [redacted]w[redacted]d 07:16 / 01:46 3830 g M VBAC EPI  LIV  1 Term 01/11/15 [redacted]w[redacted]d 10:11 / 04:31 2985 g F CS-LTranv EPI  LIV     Birth Comments: Newborn Screen BM#[id number] Barcode: [id number] Date collected: 01/12/2015 Hgb-normal, FA  Patient denies any other pertinent  gynecologic issues.  No current facility-administered medications on file prior to encounter.   Current Outpatient Medications on File Prior to Encounter  Medication Sig Dispense Refill   Norethindrone Acetate-Ethinyl Estrad-FE (LOESTRIN 24 FE) 1-20 MG-MCG(24) tablet Take 1 tablet by mouth daily. 28 tablet 11   acetaminophen (TYLENOL) 325 MG tablet Take 650 mg by mouth every 6 (six) hours as needed for moderate pain, fever or headache.     No Known Allergies  Social History:   reports that she has never smoked. She has never used smokeless tobacco. She reports that she does not drink alcohol and does not use drugs. Family History  Problem Relation Age of Onset   Hypertension Mother     Review of Systems: Pertinent items noted in HPI and remainder of comprehensive ROS otherwise negative.  PHYSICAL EXAM: Last menstrual period 08/17/2022, not currently breastfeeding. CONSTITUTIONAL: Well-developed, well-nourished female in no acute distress.  HENT:  Normocephalic, atraumatic, External right and left ear normal. Oropharynx is clear and moist EYES: Conjunctivae and EOM are normal. Pupils are equal, round, and reactive to light. No scleral icterus.  NECK: Normal range of motion, supple, no masses SKIN: Skin is warm and dry. No rash noted. Not diaphoretic. No erythema. No pallor. NEUROLOGIC: Alert and oriented  to person, place, and time. Normal reflexes, muscle tone coordination. No cranial nerve deficit noted. PSYCHIATRIC: Normal mood and affect. Normal behavior. Normal judgment and thought content. CARDIOVASCULAR: Normal heart rate noted, regular rhythm RESPIRATORY: Effort and breath sounds normal, no problems with respiration noted ABDOMEN: Soft, nontender, nondistended. PELVIC: Not examined MUSCULOSKELETAL: Normal range of motion. No tenderness.  No cyanosis, clubbing, or edema.  2+ distal pulses.  Labs: Results for orders placed or performed in visit on 09/18/22 (from the past 336  hour(s))  CBC with Differential/Platelet   Collection Time: 09/18/22 11:00 AM  Result Value Ref Range   WBC 3.9 3.4 - 10.8 x10E3/uL   RBC 4.33 3.77 - 5.28 x10E6/uL   Hemoglobin 12.8 11.1 - 15.9 g/dL   Hematocrit 82.9 56.2 - 46.6 %   MCV 88 79 - 97 fL   MCH 29.6 26.6 - 33.0 pg   MCHC 33.6 31.5 - 35.7 g/dL   RDW 13.0 (L) 86.5 - 78.4 %   Platelets 194 150 - 450 x10E3/uL   Neutrophils 48 Not Estab. %   Lymphs 26 Not Estab. %   Monocytes 11 Not Estab. %   Eos 14 Not Estab. %   Basos 1 Not Estab. %   Neutrophils Absolute 1.9 1.4 - 7.0 x10E3/uL   Lymphocytes Absolute 1.0 0.7 - 3.1 x10E3/uL   Monocytes Absolute 0.4 0.1 - 0.9 x10E3/uL   EOS (ABSOLUTE) 0.5 (H) 0.0 - 0.4 x10E3/uL   Basophils Absolute 0.0 0.0 - 0.2 x10E3/uL   Immature Granulocytes 0 Not Estab. %   Immature Grans (Abs) 0.0 0.0 - 0.1 x10E3/uL  Comprehensive metabolic panel   Collection Time: 09/18/22 11:00 AM  Result Value Ref Range   Glucose 87 70 - 99 mg/dL   BUN 8 6 - 20 mg/dL   Creatinine, Ser 6.96 0.57 - 1.00 mg/dL   eGFR 295 >28 UX/LKG/4.01   BUN/Creatinine Ratio 13 9 - 23   Sodium 140 134 - 144 mmol/L   Potassium 3.7 3.5 - 5.2 mmol/L   Chloride 103 96 - 106 mmol/L   CO2 24 20 - 29 mmol/L   Calcium 9.1 8.7 - 10.2 mg/dL   Total Protein 7.8 6.0 - 8.5 g/dL   Albumin 4.2 4.0 - 5.0 g/dL   Globulin, Total 3.6 1.5 - 4.5 g/dL   Bilirubin Total 0.6 0.0 - 1.2 mg/dL   Alkaline Phosphatase 68 44 - 121 IU/L   AST 17 0 - 40 IU/L   ALT 22 0 - 32 IU/L  Hemoglobin A1c   Collection Time: 09/18/22 11:00 AM  Result Value Ref Range   Hgb A1c MFr Bld 5.0 4.8 - 5.6 %   Est. average glucose Bld gHb Est-mCnc 97 mg/dL  Hepatitis C antibody   Collection Time: 09/18/22 11:00 AM  Result Value Ref Range   Hep C Virus Ab Non Reactive Non Reactive  Iron, TIBC and Ferritin Panel   Collection Time: 09/18/22 11:00 AM  Result Value Ref Range   Total Iron Binding Capacity 315 250 - 450 ug/dL   UIBC 027 253 - 664 ug/dL   Iron 49 27 -  403 ug/dL   Iron Saturation 16 15 - 55 %   Ferritin 59 15 - 150 ng/mL  Lipid panel   Collection Time: 09/18/22 11:00 AM  Result Value Ref Range   Cholesterol, Total 163 100 - 199 mg/dL   Triglycerides 64 0 - 149 mg/dL   HDL 55 >47 mg/dL   VLDL Cholesterol Cal 13 5 -  40 mg/dL   LDL Chol Calc (NIH) 95 0 - 99 mg/dL   Chol/HDL Ratio 3.0 0.0 - 4.4 ratio  TSH   Collection Time: 09/18/22 11:00 AM  Result Value Ref Range   TSH 1.340 0.450 - 4.500 uIU/mL  Pap LB (liquid-based)   Collection Time: 09/18/22 11:21 AM  Result Value Ref Range   DIAGNOSIS: Comment    Specimen adequacy: Comment    Clinician Provided ICD10 Comment    Performed by: Comment    PAP Smear Comment .    Note: Comment     Imaging Studies: No results found.  Assessment: Active Problems:   Unwanted fertility   Plan: - She desires permanent sterilization. Discussed alternatives including LARC options and vasectomy. She declines these options.  - Discussed surgery of salpingectomy vs tubal ligation. She would like to do a salpingectomy.  - Risks of surgery include but are not limited to: bleeding, infection, injury to surrounding organs/tissues (i.e. bowel/bladder/ureters), need for additional procedures, wound complications, hospital re-admission, and conversion to open surgery, VTE - Reviewed restrictions and recovery following surgery    Milas Hock, MD, FACOG Obstetrician & Gynecologist, [facility] for [facility], [facility]

## 2022-09-30 ENCOUNTER — Encounter: Payer: Self-pay | Admitting: Obstetrics and Gynecology

## 2022-09-30 ENCOUNTER — Encounter (HOSPITAL_BASED_OUTPATIENT_CLINIC_OR_DEPARTMENT_OTHER): Payer: Self-pay | Admitting: Obstetrics and Gynecology

## 2022-10-01 LAB — SURGICAL PATHOLOGY

## 2022-10-02 ENCOUNTER — Encounter: Payer: Self-pay | Admitting: Obstetrics and Gynecology

## 2022-10-12 ENCOUNTER — Ambulatory Visit
Admission: RE | Admit: 2022-10-12 | Discharge: 2022-10-12 | Disposition: A | Discharge status: Home / Self Care | Payer: Medicaid Other | Source: Ambulatory Visit | Attending: Family Medicine

## 2022-10-12 ENCOUNTER — Other Ambulatory Visit: Payer: Self-pay | Admitting: Family Medicine

## 2022-10-12 ENCOUNTER — Ambulatory Visit
Admission: RE | Admit: 2022-10-12 | Discharge: 2022-10-12 | Disposition: A | Discharge status: Home / Self Care | Payer: Medicaid Other | Source: Ambulatory Visit | Attending: Family Medicine | Admitting: Family Medicine

## 2022-10-12 DIAGNOSIS — Q831 Accessory breast: Secondary | ICD-10-CM

## 2022-11-03 NOTE — Progress Notes (Unsigned)
GYNECOLOGY OFFICE VISIT NOTE  History:   Courtney Colon is a 27 y.o. [id number] here today for postop check. Doing well but some itching of the incision on her left. .   She denies any abnormal vaginal discharge, bleeding, pelvic pain or other concerns.   The following portions of the patient's history were reviewed and updated as appropriate: allergies, current medications, past family history, past medical history, past social history, past surgical history and problem list.   Health Maintenance:   Normal pap in August 2024.   Review of Systems:  Pertinent items noted in HPI and remainder of comprehensive ROS otherwise negative.  Physical Exam:  BP 139/87   Pulse 87   Ht 5' (1.524 m)   Wt 154 lb (69.9 kg)   LMP 11/01/2022   BMI 30.08 kg/m  CONSTITUTIONAL: Well-developed, well-nourished female in no acute distress.  HEENT:  Normocephalic, atraumatic. External right and left ear normal. No scleral icterus.  NECK: Normal range of motion, supple, no masses noted on observation SKIN: No rash noted. Not diaphoretic. No erythema. No pallor. MUSCULOSKELETAL: Normal range of motion. No edema noted. NEUROLOGIC: Alert and oriented to person, place, and time. Normal muscle tone coordination. No cranial nerve deficit noted. PSYCHIATRIC: Normal mood and affect. Normal behavior. Normal judgment and thought content.  ABDOMEN: No masses noted. No other overt distention noted.  L/S c/d/I. Some dermabond on incision on the left. This was cleaned off.   PELVIC: Deferred  Labs and Imaging No results found for this or any previous visit (from the past 168 hour(s)). [place] AXILLA LEFT  Result Date: 10/12/2022 CLINICAL DATA:  27 year old female presenting for bilateral axillary ultrasounds due to prominent axillary tissue. States this has been present for 7 years, and initially occurred following pregnancy. EXAM: ULTRASOUND OF THE BILATERAL AXILLA COMPARISON:  None available. FINDINGS: Ultrasound targeted  to the right axilla demonstrates normal subcutaneous tissue. No abnormal lymph nodes or suspicious masses are identified. Ultrasound targeted to the left axilla demonstrates normal subcutaneous tissue. No abnormal lymph nodes or suspicious masses are identified. IMPRESSION: Normal targeted ultrasound of the bilateral axillae. RECOMMENDATION: 1. The patient is interested in surgical consultation. I advised patient to contact her primary care doctor for a surgical referral. 2. Screening mammogram at age 37 unless there are persistent or intervening clinical concerns. (Code:SM-B-40A) I have discussed the findings and recommendations with the patient. If applicable, a reminder letter will be sent to the patient regarding the next appointment. BI-RADS CATEGORY  1: Negative. Electronically Signed   By: Frederico Hamman M.D.   On: 10/12/2022 11:15   [place] AXILLA RIGHT  Result Date: 10/12/2022 CLINICAL DATA:  27 year old female presenting for bilateral axillary ultrasounds due to prominent axillary tissue. States this has been present for 7 years, and initially occurred following pregnancy. EXAM: ULTRASOUND OF THE BILATERAL AXILLA COMPARISON:  None available. FINDINGS: Ultrasound targeted to the right axilla demonstrates normal subcutaneous tissue. No abnormal lymph nodes or suspicious masses are identified. Ultrasound targeted to the left axilla demonstrates normal subcutaneous tissue. No abnormal lymph nodes or suspicious masses are identified. IMPRESSION: Normal targeted ultrasound of the bilateral axillae. RECOMMENDATION: 1. The patient is interested in surgical consultation. I advised patient to contact her primary care doctor for a surgical referral. 2. Screening mammogram at age 70 unless there are persistent or intervening clinical concerns. (Code:SM-B-40A) I have discussed the findings and recommendations with the patient. If applicable, a reminder letter will be sent to the patient regarding the next  appointment. BI-RADS CATEGORY  1: Negative. Electronically Signed   By: Frederico Hamman M.D.   On: 10/12/2022 11:15    Assessment and Plan:   1. Postop check May use topical benadryl for itching and may use neosporin. Would use until itching resolves but incisions are healing well. No restrictions.    Routine preventative health maintenance measures emphasized. Please refer to After Visit Summary for other counseling recommendations.   Return if symptoms worsen or fail to improve.  Milas Hock, MD, FACOG Obstetrician & Gynecologist, [facility] for [facility], [facility]

## 2022-11-04 ENCOUNTER — Encounter: Payer: Self-pay | Admitting: Obstetrics and Gynecology

## 2022-11-04 ENCOUNTER — Ambulatory Visit (INDEPENDENT_AMBULATORY_CARE_PROVIDER_SITE_OTHER): Payer: Self-pay | Admitting: Obstetrics and Gynecology

## 2022-11-04 VITALS — BP 139/87 | HR 87 | Ht 60.0 in | Wt 154.0 lb

## 2022-11-04 DIAGNOSIS — Z09 Encounter for follow-up examination after completed treatment for conditions other than malignant neoplasm: Secondary | ICD-10-CM

## 2022-11-04 DIAGNOSIS — Z01419 Encounter for gynecological examination (general) (routine) without abnormal findings: Secondary | ICD-10-CM

## 2023-01-04 ENCOUNTER — Ambulatory Visit: Payer: Self-pay | Admitting: Family Medicine

## 2023-01-04 NOTE — Telephone Encounter (Signed)
Copied from CRM [id number]. Topic: Clinical - Red Word Triage >> Jan 04, 2023  8:36 AM Prudencio Pair wrote: Red Word that prompted transfer to Nurse Triage: Patient thinks she has a UTI and states she is having back pain. Wants to see if provider can give her medication or maybe an appt to come in. Pt states pain level is about a 5 and she has been dealing with this for about 3 days and pain just started this morning.  Chief Complaint: pain in lower back, pain with urination Symptoms: pain, dysuria Frequency: pain started today Pertinent Negatives: Patient denies fever and blood in urine, states is in menses at this time.  Disposition: [] ED /[] Urgent Care (no appt availability in office) / [x] Appointment(In office/virtual)/ []  [place] [facility]/ [] Home Care/ [] Refused Recommended Disposition /[] [place] [facility]/ []  Follow-up with PCP Additional Notes: patient called in with what she describes as possible uti.  States has had these symptoms before and it was UTI.  Instructed to go to urgent care if becomes worse.  Apt, made for tomorrow.   Reason for Disposition  Side (flank) or lower back pain present  Answer Assessment - Initial Assessment Questions 1. SYMPTOM: "What's the main symptom you're concerned about?" (e.g., frequency, incontinence)     Burning with urination, urgency and back pain 2. ONSET: "When did the  pain  start?"     Back pain this am.  3. PAIN: "Is there any pain?" If Yes, ask: "How bad is it?" (Scale: 1-10; mild, moderate, severe)     5 4. CAUSE: "What do you think is causing the symptoms?"     uti 5. OTHER SYMPTOMS: "Do you have any other symptoms?" (e.g., blood in urine, fever, flank pain, pain with urination)     No fever, no blood 6. PREGNANCY: "Is there any chance you are pregnant?" "When was your last menstrual period?"     no  Protocols used: Urinary Symptoms-A-AH

## 2023-01-05 ENCOUNTER — Ambulatory Visit (INDEPENDENT_AMBULATORY_CARE_PROVIDER_SITE_OTHER): Payer: Self-pay | Admitting: Plastic Surgery

## 2023-01-05 ENCOUNTER — Ambulatory Visit: Payer: Self-pay | Admitting: Family Medicine

## 2023-01-05 ENCOUNTER — Encounter: Payer: Self-pay | Admitting: Plastic Surgery

## 2023-01-05 VITALS — BP 124/85 | HR 94 | Ht 60.0 in | Wt 154.6 lb

## 2023-01-05 DIAGNOSIS — Q831 Accessory breast: Secondary | ICD-10-CM

## 2023-01-05 NOTE — Progress Notes (Signed)
Patient ID: Courtney Colon, female    DOB: 05-22-1995, 27 y.o.   MRN: [id number]   Chief Complaint  Patient presents with   Advice Only   Breast Problem    The patient is a 27 year old female here for evaluation of her axilla.  She is 5 feet tall and weighs 154 pounds.  Over the last several years she has had excess axillary tissue that has been getting much worse over time.  It is approximately 5 x 8 cm in size on both sides.  It gets much worse during her menstrual period and very tender.  It is sometimes difficult to put her arms straight down.  Has 2 kids and is not planning on having anymore.  She is otherwise in good health and is not a smoker and not on any blood thinners.  The area is slightly tender to touch but no lumps or bumps are noticed and she did have a mammogram which was negative.    Review of Systems  Constitutional: Negative.   HENT: Negative.    Eyes: Negative.   Respiratory: Negative.    Cardiovascular: Negative.   Gastrointestinal: Negative.   Endocrine: Negative.   Genitourinary: Negative.   Musculoskeletal: Negative.     Past Medical History:  Diagnosis Date   Anemia    Establishing care with new doctor, encounter for 09/11/2022   Hx of varicella     Past Surgical History:  Procedure Laterality Date   CESAREAN SECTION N/A 01/11/2015   Procedure: CESAREAN SECTION;  Surgeon: Philip Aspen, DO;  Location: [facility];  Service: Obstetrics;  Laterality: N/A;   DILATION AND EVACUATION N/A 02/18/2022   Procedure: DILATATION AND EVACUATION;  Surgeon: Milas Hock, MD;  Location: [facility];  Service: Gynecology;  Laterality: N/A;   IUD REMOVAL N/A 01/30/2022   Procedure: INTRAUTERINE DEVICE (IUD) REMOVAL;  Surgeon: Reva Bores, MD;  Location: [facility] OR;  Service: Gynecology;  Laterality: N/A;   LAPAROSCOPIC BILATERAL SALPINGECTOMY Bilateral 09/29/2022   Procedure: LAPAROSCOPIC BILATERAL SALPINGECTOMY;  Surgeon: Milas Hock, MD;  Location: [facility];   Service: Gynecology;  Laterality: Bilateral;   MOUTH SURGERY  01/26/2009   wisdom teeth ext   OPERATIVE ULTRASOUND N/A 01/30/2022   Procedure: OPERATIVE ULTRASOUND;  Surgeon: Reva Bores, MD;  Location: [facility] OR;  Service: Gynecology;  Laterality: N/A;      Current Outpatient Medications:    mometasone (ELOCON) 0.1 % lotion, SMARTSIG:In Ear(s), Disp: , Rfl:    Objective:   Vitals:   01/05/23 1121  BP: 124/85  Pulse: 94  SpO2: 96%    Physical Exam Vitals reviewed.  Constitutional:      Appearance: Normal appearance.  Cardiovascular:     Rate and Rhythm: Normal rate.     Pulses: Normal pulses.  Pulmonary:     Effort: Pulmonary effort is normal.  Skin:    General: Skin is warm.     Capillary Refill: Capillary refill takes less than 2 seconds.  Neurological:     Mental Status: She is alert and oriented to person, place, and time.  Psychiatric:        Mood and Affect: Mood normal.        Behavior: Behavior normal.        Thought Content: Thought content normal.        Judgment: Judgment normal.     Assessment & Plan:  Axillary accessory breast tissue  It is a good candidate for excision of bilateral axillary  breast tissue.  We discussed the risks and complications including bleeding pain and scar.  This will not make it perfect but it will improve the fullness for sure.  I cannot guarantee that she will not have changes during her menstrual period.  Pictures were obtained of the patient and placed in the chart with the patient's or guardian's permission.   Courtney Bills Celestial Barnfield, DO

## 2023-02-05 ENCOUNTER — Encounter: Payer: Self-pay | Admitting: Plastic Surgery

## 2023-03-04 ENCOUNTER — Encounter: Payer: Self-pay | Admitting: Plastic Surgery

## 2023-04-08 ENCOUNTER — Encounter: Payer: Self-pay | Admitting: Plastic Surgery

## 2023-05-17 ENCOUNTER — Ambulatory Visit: Admitting: Family Medicine

## 2023-05-17 ENCOUNTER — Encounter: Payer: Self-pay | Admitting: Family Medicine

## 2023-05-17 VITALS — BP 124/85 | HR 98 | Ht 60.0 in | Wt 163.4 lb

## 2023-05-17 DIAGNOSIS — H6090 Unspecified otitis externa, unspecified ear: Secondary | ICD-10-CM | POA: Insufficient documentation

## 2023-05-17 DIAGNOSIS — H60391 Other infective otitis externa, right ear: Secondary | ICD-10-CM | POA: Diagnosis not present

## 2023-05-17 MED ORDER — MUPIROCIN 2 % EX OINT: 1.0000 | TOPICAL_OINTMENT | Freq: Two times a day (BID) | CUTANEOUS | 0 refills | Status: AC

## 2023-05-17 NOTE — Progress Notes (Signed)
   Acute Office Visit  Subjective:     Patient ID: Courtney Colon, female    DOB: [birth year], 28 y.o.   MRN: [id number]  Chief Complaint  Patient presents with   Ear Pain    X3days pt states it feels like there is a bump inside her ear. Painful and itches sometimes    HPI Patient is in today for bump in the right ear. Present for 3 days. No hearing loss or pain in the inside of ear.  No fever.    Review of Systems  Constitutional:  Negative for fever.  HENT:  Positive for ear pain (external). Negative for hearing loss.          Objective:    BP 124/85 (BP Location: Left Arm, Patient Position: Sitting, Cuff Size: Normal)   Pulse 98   Ht 5' (1.524 m)   Wt 163 lb 6 oz (74.1 kg)   SpO2 99%   BMI 31.91 kg/m    Physical Exam Vitals and nursing note reviewed.  Constitutional:      General: She is not in acute distress.    Appearance: Normal appearance.  HENT:     Right Ear: Tympanic membrane normal.     Left Ear: Tympanic membrane normal.     Ears:     Comments: Right internal ear lobe with swelling and tender to touch, some erythema.  Cardiovascular:     Rate and Rhythm: Normal rate.  Pulmonary:     Effort: Pulmonary effort is normal.     Breath sounds: Normal breath sounds.  Lymphadenopathy:     Cervical: No cervical adenopathy.  Skin:    General: Skin is warm and dry.  Neurological:     General: No focal deficit present.     Mental Status: She is alert. Mental status is at baseline.  Psychiatric:        Mood and Affect: Mood normal.        Behavior: Behavior normal.        Thought Content: Thought content normal.        Judgment: Judgment normal.    No results found for any visits on 05/17/23.      Assessment & Plan:   Problem List Items Addressed This Visit     Otitis externa - Primary   Right external ear with area that is tender to touch with swelling.  TMs normal. No pain inside of ear. No fever or hearing changes. Mupirocin  2% ointment  externally on affected area BID. If symptoms are not improving in 2-3 days of treatment, will notify provider via My Chart and will start oral antibiotic.       Relevant Medications   mupirocin  ointment (BACTROBAN ) 2 %    Meds ordered this encounter  Medications   mupirocin  ointment (BACTROBAN ) 2 %    Sig: Apply 1 Application topically 2 (two) times daily.    Dispense:  22 g    Refill:  0    Supervising Provider:   Manette Section [[id number]]  Agrees with plan of care discussed.  Questions answered.   Return if symptoms worsen or fail to improve.  Mickiel Albany, FNP

## 2023-05-17 NOTE — Addendum Note (Signed)
 Addended by: Buddie Carina R on: 05/17/2023 03:09 PM   Modules accepted: Level of Service

## 2023-05-17 NOTE — Assessment & Plan Note (Addendum)
 Right external ear with area that is tender to touch with swelling.  TMs normal. No pain inside of ear. No fever or hearing changes. Mupirocin  2% ointment externally on affected area BID. If symptoms are not improving in 2-3 days of treatment, will notify provider via My Chart and will start oral antibiotic.

## 2023-06-04 ENCOUNTER — Encounter: Payer: Self-pay | Admitting: Family Medicine

## 2023-06-04 ENCOUNTER — Other Ambulatory Visit: Payer: Self-pay | Admitting: Family Medicine

## 2023-06-04 DIAGNOSIS — H6690 Otitis media, unspecified, unspecified ear: Secondary | ICD-10-CM | POA: Insufficient documentation

## 2023-06-04 MED ORDER — AMOXICILLIN-POT CLAVULANATE 875-125 MG PO TABS
1.0000 | ORAL_TABLET | Freq: Two times a day (BID) | ORAL | 0 refills | Status: DC
Start: 2023-06-04 — End: 2023-06-18

## 2023-06-08 ENCOUNTER — Other Ambulatory Visit: Payer: Self-pay | Admitting: Family Medicine

## 2023-06-08 DIAGNOSIS — H9209 Otalgia, unspecified ear: Secondary | ICD-10-CM | POA: Insufficient documentation

## 2023-06-18 ENCOUNTER — Encounter: Payer: Self-pay | Admitting: Family Medicine

## 2023-06-18 ENCOUNTER — Ambulatory Visit: Admitting: Family Medicine

## 2023-06-18 VITALS — BP 120/84 | HR 89 | Temp 98.2°F | Resp 20 | Ht 60.0 in | Wt 158.5 lb

## 2023-06-18 DIAGNOSIS — H6693 Otitis media, unspecified, bilateral: Secondary | ICD-10-CM

## 2023-06-18 DIAGNOSIS — R509 Fever, unspecified: Secondary | ICD-10-CM

## 2023-06-18 MED ORDER — CEFDINIR 300 MG PO CAPS
300.0000 mg | ORAL_CAPSULE | Freq: Two times a day (BID) | ORAL | 0 refills | Status: DC
Start: 2023-06-18 — End: 2023-09-10

## 2023-06-18 NOTE — Progress Notes (Signed)
 Acute Office Visit  Subjective:     Patient ID: Courtney Colon, female    DOB: [birth year], 28 y.o.   MRN: [id number]  Chief Complaint  Patient presents with   Headache   Fever    CHILLS    Headache  Associated symptoms include ear pain and a fever. Pertinent negatives include no coughing or sore throat.  Fever  Associated symptoms include ear pain and headaches. Pertinent negatives include no coughing or sore throat.   Patient is in today for acute visit.  She recently had treatment with Augmentin  last Wednesday for right OM. She still has some ear pain mainly the right ear. She says this Wednesday she started having headaches. Yesterday evening she had fevers chills. She been taking Ibuprofen . She denies sore throat. Decrease appetite. She denies aches and pains. No coughing. She reports no nasal congestion. She has had 3 ear infections this year alone. She has appt with ENT in July.   Review of Systems  Constitutional:  Positive for fever.  HENT:  Positive for ear pain. Negative for sinus pain and sore throat.   Respiratory:  Negative for cough.   Musculoskeletal:  Negative for myalgias.  Neurological:  Positive for headaches.  All other systems reviewed and are negative.       Objective:    BP 120/84 (BP Location: Left Arm, Cuff Size: Small)   Pulse 89   Temp 98.2 F (36.8 C) (Oral)   Resp 20   Ht 5' (1.524 m)   Wt 158 lb 8 oz (71.9 kg)   SpO2 98%   BMI 30.95 kg/m  BP Readings from Last 3 Encounters:  06/18/23 120/84  05/17/23 124/85  01/05/23 124/85      Physical Exam Vitals and nursing note reviewed.  Constitutional:      Appearance: Normal appearance. She is normal weight.  HENT:     Head: Normocephalic and atraumatic.     Right Ear: External ear normal.     Left Ear: External ear normal.     Ears:     Comments: Erythematous bilateral TM and bulging    Nose: Nose normal.     Mouth/Throat:     Mouth: Mucous membranes are moist.     Pharynx:  Oropharynx is clear.  Eyes:     Conjunctiva/sclera: Conjunctivae normal.     Pupils: Pupils are equal, round, and reactive to light.  Cardiovascular:     Rate and Rhythm: Normal rate.  Pulmonary:     Effort: Pulmonary effort is normal.  Abdominal:     General: Abdomen is flat. Bowel sounds are normal.  Skin:    General: Skin is warm.     Capillary Refill: Capillary refill takes less than 2 seconds.  Neurological:     General: No focal deficit present.     Mental Status: She is alert and oriented to person, place, and time. Mental status is at baseline.  Psychiatric:        Mood and Affect: Mood normal.        Behavior: Behavior normal.        Thought Content: Thought content normal.        Judgment: Judgment normal.    No results found for any visits on 06/18/23.      Assessment & Plan:   Problem List Items Addressed This Visit       Nervous and Auditory   Acute otitis media   Relevant Medications   cefdinir (OMNICEF) 300 MG  capsule   Other Visit Diagnoses       Fever, unspecified fever cause    -  Primary   Relevant Medications   cefdinir (OMNICEF) 300 MG capsule       Meds ordered this encounter  Medications   cefdinir (OMNICEF) 300 MG capsule    Sig: Take 1 capsule (300 mg total) by mouth 2 (two) times daily.    Dispense:  14 capsule    Refill:  0   Fever, unspecified fever cause -     Cefdinir; Take 1 capsule (300 mg total) by mouth 2 (two) times daily.  Dispense: 14 capsule; Refill: 0  OM (otitis media), recurrent, bilateral -     Cefdinir; Take 1 capsule (300 mg total) by mouth 2 (two) times daily.  Dispense: 14 capsule; Refill: 0   Pt with recurrent OM. Bilateral erythematous TM and bulging consistent with OM. Treat with different antibiotics, Omnicef x 7 days. Was also advised to start probiotics due to recent recurrent antibiotic usage. To see PCP for worsening symptoms and see ENT in July as scheduled.  No follow-ups on file.  Manette Section,  MD

## 2023-09-09 ENCOUNTER — Ambulatory Visit: Admitting: Family Medicine

## 2023-09-09 ENCOUNTER — Ambulatory Visit (HOSPITAL_BASED_OUTPATIENT_CLINIC_OR_DEPARTMENT_OTHER)
Admission: RE | Admit: 2023-09-09 | Discharge: 2023-09-09 | Disposition: A | Discharge status: Home / Self Care | Source: Ambulatory Visit | Attending: Family Medicine | Admitting: Family Medicine

## 2023-09-09 ENCOUNTER — Ambulatory Visit (HOSPITAL_BASED_OUTPATIENT_CLINIC_OR_DEPARTMENT_OTHER): Admission: RE | Admit: 2023-09-09 | Source: Ambulatory Visit

## 2023-09-09 ENCOUNTER — Other Ambulatory Visit (HOSPITAL_BASED_OUTPATIENT_CLINIC_OR_DEPARTMENT_OTHER)

## 2023-09-09 VITALS — BP 120/81 | HR 83 | Temp 98.8°F | Ht 60.0 in | Wt 158.0 lb

## 2023-09-09 DIAGNOSIS — R1032 Left lower quadrant pain: Secondary | ICD-10-CM | POA: Insufficient documentation

## 2023-09-09 MED ORDER — IOHEXOL 300 MG/ML  SOLN
100.0000 mL | Freq: Once | INTRAMUSCULAR | Status: AC | PRN
  Administered 2023-09-09: 100 mL via INTRAVENOUS

## 2023-09-09 NOTE — Addendum Note (Signed)
 Addended by: BOOKER PAO R on: 09/09/2023 11:54 AM   Modules accepted: Orders

## 2023-09-09 NOTE — Progress Notes (Addendum)
   Acute Office Visit  Subjective:     Patient ID: Courtney Colon, female    DOB: 12/04/1995, 28 y.o.   MRN: [id number]  Chief Complaint  Patient presents with   Flank Pain    Left side for last few days     HPI Patient is in today for left side cramping for past two days. Cycle ended yesterday a cramp started that would not go away. No pain with sitting. Pain with movement. 6/10 cramping. No nausea or vomiting or trauma. Started after cycle ended. Never happened before. No fever. No constipation.   Review of Systems  Constitutional:  Negative for chills and fever.  Gastrointestinal:  Positive for abdominal pain. Negative for constipation, nausea and vomiting.        Objective:    BP 120/81 (BP Location: Left Arm, Patient Position: Sitting, Cuff Size: Normal)   Pulse 83   Temp 98.8 F (37.1 C) (Oral)   Ht 5' (1.524 m)   Wt 158 lb (71.7 kg)   LMP 09/05/2023 (Exact Date)   SpO2 98%   BMI 30.86 kg/m    Physical Exam Vitals and nursing note reviewed.  Constitutional:      General: She is not in acute distress.    Appearance: Normal appearance. She is not ill-appearing.  Cardiovascular:     Rate and Rhythm: Normal rate and regular rhythm.     Heart sounds: Normal heart sounds.  Pulmonary:     Effort: Pulmonary effort is normal.     Breath sounds: Normal breath sounds.  Abdominal:     General: Bowel sounds are normal.     Tenderness: There is abdominal tenderness (left lower quadrant and entire left side). There is no right CVA tenderness or left CVA tenderness.  Skin:    General: Skin is warm and dry.  Neurological:     General: No focal deficit present.     Mental Status: She is alert. Mental status is at baseline.  Psychiatric:        Mood and Affect: Mood normal.        Behavior: Behavior normal.        Thought Content: Thought content normal.        Judgment: Judgment normal.     No results found for any visits on 09/09/23.      Assessment &  Plan:   Problem List Items Addressed This Visit     Left lower quadrant abdominal pain - Primary   Left side cramping for 2 days. No fever, nausea, vomiting, or constipation. She does not look toxic.  Vital signs are normal.  Upon assessment there is general tenderness on left side of abdomen. CBC/diff Stat CT abdomen/pelvis ordered.  Follow-up based on resutls.       Relevant Orders   CBC with Differential/Platelet   US  Abdomen Complete   CT ABDOMEN PELVIS W CONTRAST  Agrees with plan of care discussed.  Questions answered.      Return if symptoms worsen or fail to improve.  Darice JONELLE Brownie, FNP

## 2023-09-09 NOTE — Assessment & Plan Note (Addendum)
 Left side cramping for 2 days. No fever, nausea, vomiting, or constipation. She does not look toxic.  Vital signs are normal.  Upon assessment there is general tenderness on left side of abdomen. CBC/diff Stat CT abdomen/pelvis ordered.  Follow-up based on resutls.

## 2023-09-10 ENCOUNTER — Other Ambulatory Visit: Payer: Self-pay | Admitting: Family Medicine

## 2023-09-10 ENCOUNTER — Ambulatory Visit: Payer: Self-pay | Admitting: Family Medicine

## 2023-09-10 ENCOUNTER — Ambulatory Visit (HOSPITAL_BASED_OUTPATIENT_CLINIC_OR_DEPARTMENT_OTHER)

## 2023-09-10 DIAGNOSIS — K529 Noninfective gastroenteritis and colitis, unspecified: Secondary | ICD-10-CM | POA: Insufficient documentation

## 2023-09-10 LAB — CBC WITH DIFFERENTIAL/PLATELET
Basophils Absolute: 0 x10E3/uL (ref 0.0–0.2)
Basos: 1 %
EOS (ABSOLUTE): 0.4 x10E3/uL (ref 0.0–0.4)
Eos: 6 %
Hematocrit: 39.1 % (ref 34.0–46.6)
Hemoglobin: 12.5 g/dL (ref 11.1–15.9)
Immature Grans (Abs): 0 x10E3/uL (ref 0.0–0.1)
Immature Granulocytes: 0 %
Lymphocytes Absolute: 2.3 x10E3/uL (ref 0.7–3.1)
Lymphs: 32 %
MCH: 29.6 pg (ref 26.6–33.0)
MCHC: 32 g/dL (ref 31.5–35.7)
MCV: 93 fL (ref 79–97)
Monocytes Absolute: 0.4 x10E3/uL (ref 0.1–0.9)
Monocytes: 6 %
Neutrophils Absolute: 4 x10E3/uL (ref 1.4–7.0)
Neutrophils: 55 %
Platelets: 221 x10E3/uL (ref 150–450)
RBC: 4.22 x10E6/uL (ref 3.77–5.28)
RDW: 12.1 % (ref 11.7–15.4)
WBC: 7.2 x10E3/uL (ref 3.4–10.8)

## 2023-09-10 MED ORDER — AMOXICILLIN-POT CLAVULANATE 875-125 MG PO TABS
1.0000 | ORAL_TABLET | Freq: Two times a day (BID) | ORAL | 0 refills | Status: AC
Start: 2023-09-10 — End: ?

## 2023-12-17 ENCOUNTER — Ambulatory Visit

## 2023-12-17 VITALS — BP 122/87 | HR 80 | Ht 60.0 in | Wt 159.0 lb

## 2023-12-17 DIAGNOSIS — R300 Dysuria: Secondary | ICD-10-CM

## 2023-12-17 LAB — POCT URINALYSIS DIPSTICK
Bilirubin, UA: NEGATIVE
Glucose, UA: NEGATIVE
Ketones, UA: NEGATIVE
Nitrite, UA: POSITIVE
Protein, UA: NEGATIVE
Spec Grav, UA: >=1.03 — AB (ref 1.010–1.025)
Urobilinogen, UA: 0.2 U/dL
pH, UA: 6 (ref 5.0–8.0)

## 2023-12-17 MED ORDER — PHENAZOPYRIDINE HCL 200 MG PO TABS
200.0000 mg | ORAL_TABLET | Freq: Three times a day (TID) | ORAL | 0 refills | Status: AC | PRN
Start: 2023-12-17 — End: ?

## 2023-12-17 MED ORDER — NITROFURANTOIN MONOHYD MACRO 100 MG PO CAPS
100.0000 mg | ORAL_CAPSULE | Freq: Two times a day (BID) | ORAL | 0 refills | Status: AC
Start: 2023-12-17 — End: ?

## 2023-12-17 NOTE — Progress Notes (Signed)
 SUBJECTIVE: Courtney Colon is a 28 y.o. female who complains of urinary frequency, urgency and dysuria x 3 days, without flank pain, fever, chills, or abnormal vaginal discharge or bleeding. Pt also reported having bladder spasms.  OBJECTIVE: Appears well, in no apparent distress.  Vital signs are normal. Urine dipstick shows positive for RBC's, positive for nitrates, and positive for leukocytes.    ASSESSMENT: Dysuria  PLAN: Treatment per orders.  Call or return to clinic prn if these symptoms worsen or fail to improve as anticipated. Urine Cx send. Standing orders sent for Macrobid  and Pyridium .   Silvano LELON Piano, RN

## 2023-12-20 ENCOUNTER — Telehealth: Payer: Self-pay

## 2023-12-20 NOTE — Telephone Encounter (Signed)
 RN received message from Access Nurse regarding upset stomach on 11/21 after taking antibiotic on empty stomach. Pt reported has now since started taking with food, only mild cramping now. Pt reported still having UTI symptoms. RN advised to continue course and notify office if symptoms still present after treatment.   Silvano LELON Piano, RN

## 2023-12-23 LAB — URINE CULTURE

## 2024-01-26 ENCOUNTER — Telehealth: Payer: Self-pay | Admitting: *Deleted

## 2024-01-26 NOTE — Telephone Encounter (Signed)
 Returned call from 01/24/2024 at 12:52 PM, office closed for lunch. Left patient a message to call the office.
